# Patient Record
Sex: Female | Born: 1975 | Race: Black or African American | Hispanic: No | Marital: Single | Smoking: Former smoker
Health system: Southern US, Community
[De-identification: ages and names within clinical notes are randomized; demographics above are authoritative.]

## PROBLEM LIST (undated history)

## (undated) DIAGNOSIS — Z803 Family history of malignant neoplasm of breast: Secondary | ICD-10-CM

## (undated) DIAGNOSIS — E282 Polycystic ovarian syndrome: Secondary | ICD-10-CM

## (undated) DIAGNOSIS — E119 Type 2 diabetes mellitus without complications: Secondary | ICD-10-CM

## (undated) DIAGNOSIS — Z87410 Personal history of cervical dysplasia: Secondary | ICD-10-CM

## (undated) DIAGNOSIS — Z801 Family history of malignant neoplasm of trachea, bronchus and lung: Secondary | ICD-10-CM

## (undated) DIAGNOSIS — Z800 Family history of malignant neoplasm of digestive organs: Secondary | ICD-10-CM

## (undated) DIAGNOSIS — E669 Obesity, unspecified: Secondary | ICD-10-CM

## (undated) DIAGNOSIS — I10 Essential (primary) hypertension: Secondary | ICD-10-CM

## (undated) DIAGNOSIS — K219 Gastro-esophageal reflux disease without esophagitis: Secondary | ICD-10-CM

## (undated) DIAGNOSIS — T7840XA Allergy, unspecified, initial encounter: Secondary | ICD-10-CM

## (undated) DIAGNOSIS — K8020 Calculus of gallbladder without cholecystitis without obstruction: Secondary | ICD-10-CM

## (undated) DIAGNOSIS — N9489 Other specified conditions associated with female genital organs and menstrual cycle: Secondary | ICD-10-CM

## (undated) HISTORY — DX: Obesity, unspecified: E66.9

## (undated) HISTORY — DX: Essential (primary) hypertension: I10

## (undated) HISTORY — DX: Family history of malignant neoplasm of trachea, bronchus and lung: Z80.1

## (undated) HISTORY — DX: Family history of malignant neoplasm of breast: Z80.3

## (undated) HISTORY — DX: Personal history of cervical dysplasia: Z87.410

## (undated) HISTORY — DX: Family history of malignant neoplasm of digestive organs: Z80.0

## (undated) HISTORY — DX: Other specified conditions associated with female genital organs and menstrual cycle: N94.89

## (undated) HISTORY — DX: Allergy, unspecified, initial encounter: T78.40XA

## (undated) HISTORY — DX: Gastro-esophageal reflux disease without esophagitis: K21.9

---
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
## 1999-02-10 ENCOUNTER — Emergency Department (HOSPITAL_COMMUNITY): Admission: EM | Admit: 1999-02-10 | Discharge: 1999-02-10 | Payer: Self-pay | Admitting: Emergency Medicine

## 1999-06-04 ENCOUNTER — Encounter: Payer: Self-pay | Admitting: Family Medicine

## 1999-06-04 ENCOUNTER — Encounter: Admission: RE | Admit: 1999-06-04 | Discharge: 1999-06-04 | Payer: Self-pay | Admitting: Family Medicine

## 2003-01-22 ENCOUNTER — Encounter: Payer: Self-pay | Admitting: Emergency Medicine

## 2003-01-22 ENCOUNTER — Emergency Department (HOSPITAL_COMMUNITY): Admission: EM | Admit: 2003-01-22 | Discharge: 2003-01-22 | Payer: Self-pay

## 2005-10-02 ENCOUNTER — Emergency Department (HOSPITAL_COMMUNITY): Admission: EM | Admit: 2005-10-02 | Discharge: 2005-10-02 | Payer: Self-pay | Admitting: Family Medicine

## 2006-02-21 ENCOUNTER — Emergency Department (HOSPITAL_COMMUNITY): Admission: EM | Admit: 2006-02-21 | Discharge: 2006-02-21 | Payer: Self-pay | Admitting: Family Medicine

## 2006-04-11 ENCOUNTER — Other Ambulatory Visit: Admission: RE | Admit: 2006-04-11 | Discharge: 2006-04-11 | Payer: Self-pay | Admitting: Family Medicine

## 2006-06-13 ENCOUNTER — Ambulatory Visit (HOSPITAL_COMMUNITY): Admission: RE | Admit: 2006-06-13 | Discharge: 2006-06-13 | Payer: Self-pay | Admitting: Family Medicine

## 2007-07-23 ENCOUNTER — Ambulatory Visit (HOSPITAL_COMMUNITY): Admission: RE | Admit: 2007-07-23 | Discharge: 2007-07-23 | Payer: Self-pay | Admitting: Obstetrics and Gynecology

## 2007-09-10 ENCOUNTER — Inpatient Hospital Stay (HOSPITAL_COMMUNITY): Admission: AD | Admit: 2007-09-10 | Discharge: 2007-09-12 | Payer: Self-pay | Admitting: Obstetrics and Gynecology

## 2007-09-10 ENCOUNTER — Encounter: Payer: Self-pay | Admitting: Obstetrics and Gynecology

## 2007-09-11 ENCOUNTER — Encounter: Payer: Self-pay | Admitting: Obstetrics and Gynecology

## 2007-09-29 ENCOUNTER — Inpatient Hospital Stay (HOSPITAL_COMMUNITY): Admission: AD | Admit: 2007-09-29 | Discharge: 2007-09-29 | Payer: Self-pay | Admitting: Obstetrics and Gynecology

## 2007-10-08 ENCOUNTER — Inpatient Hospital Stay (HOSPITAL_COMMUNITY): Admission: AD | Admit: 2007-10-08 | Discharge: 2007-10-11 | Payer: Self-pay | Admitting: Obstetrics and Gynecology

## 2007-10-10 ENCOUNTER — Encounter (INDEPENDENT_AMBULATORY_CARE_PROVIDER_SITE_OTHER): Payer: Self-pay | Admitting: Obstetrics and Gynecology

## 2008-07-05 ENCOUNTER — Emergency Department (HOSPITAL_COMMUNITY): Admission: EM | Admit: 2008-07-05 | Discharge: 2008-07-06 | Payer: Self-pay | Admitting: Emergency Medicine

## 2010-05-27 ENCOUNTER — Encounter: Admission: RE | Admit: 2010-05-27 | Discharge: 2010-05-27 | Payer: Self-pay | Admitting: Internal Medicine

## 2010-07-15 ENCOUNTER — Encounter: Payer: Self-pay | Admitting: Obstetrics and Gynecology

## 2010-10-08 LAB — URINALYSIS, ROUTINE W REFLEX MICROSCOPIC
Hgb urine dipstick: NEGATIVE
Specific Gravity, Urine: 1.029 (ref 1.005–1.030)
Urobilinogen, UA: 1 mg/dL (ref 0.0–1.0)
pH: 7 (ref 5.0–8.0)

## 2010-11-06 NOTE — Discharge Summary (Signed)
Doris Armstrong, [place]                ACCOUNT NO.:  [id number]   MEDICAL RECORD NO.:  [id number]          PATIENT TYPE:  INP   LOCATION:  9302                          FACILITY:  [facility]   PHYSICIAN:  Malachi Pro. Ambrose Mantle, M.D. DATE OF BIRTH:  April 20, 1976   DATE OF ADMISSION:  10/08/2007  DATE OF DISCHARGE:  10/11/2007                               DISCHARGE SUMMARY   A 35 year old black female para 0, gravida 1 admitted with cervical  dilatation and probable labor.  The patient's history and physical is  dictated.  She underwent IVF conceived twin pregnancy and that her 59-  week ultrasound was noted to be 5 cm dilated per ultrasound.  My exam  showed the cervix to be more like 3-4 cm dilated.  I did put a cerclage  in with 3 sutures of 0 Prolene and placed her on Procardia.  She had  managed with bedrest at home but on October 07, 2007, the patient began  contracting, having lower abdominal cramps and bloody discharge, came to  the hospital, Dr. Jackelyn Knife admitted her, kept in the Trendelenburg  position, placed her on the Unasyn and kept the Procardia going.  In  spite of that, she began to have significant labor and she was without  question felt to be in labor.  The sutures were cut and the patient  progressed to deliver the twin pregnancies.  Both were previable.  The  delivery was attended by Dr. Ruben Gottron.  Both infants were female.  Both weighed 1 pound 3 ounces.  Both Apgars were 2, 1, 1, and 1.  The  first baby delivered, tied the cord off in the vagina hoping that the  placenta and the twin B would remain in situ, however, the patient  continued to contract and baby B presented.  Baby B delivered.  On call  opened the sac to allow the baby to breathe.  The placenta came with the  baby and the placenta of twin A was fished out with ring forceps.  The  patient could not tolerate a vaginal exam well, but there was no  evidence of any retained fragments of the placenta, although I did  continue her Unasyn for 24 hours postop.  The patient's white count was  11,500, hemoglobin 11.8, hematocrit 34.8, platelet count 249,000,  followup hemoglobin 9.9.  RPR was nonreactive.  Hepatitis B surface  antigen negative.  Rubella was normal at 21.9 indicating immunity.  The  blood group and type AB positive with a negative antibody.  The patient  remained afebrile.  Baby A was actually delivered on the 17th, baby B on  the 18th, so whether she is 1 or 2 days postpartum.  At the present  time, she is stable and ready for discharge.   FINAL DIAGNOSIS:  Intrauterine pregnancy 22-1/2 weeks, twin gestation  from in vitro fertilization, delivered previable twins.  The patient has  a history of hyperstimulation syndrome and abnormal liver function  tests.   DISCHARGE MEDICATIONS:  1. Augmentin 500 mg p.o. q.8 h. for 5 days.  2. Motrin 600 mg 30  tablets 1 every 6 hours as needed for pain.   The patient is advised to return to the office in 4 weeks for followup  examination.  She was counseled about the signs and symptoms of  postpartum depression.  She has declined counseling at the present time,  but she is encouraged to call if she has any signs of depression.      Malachi Pro. Ambrose Mantle, M.D.  Electronically Signed     TFH/MEDQ  D:  10/11/2007  T:  10/12/2007  Job:  [id number]   cc:   Fermin Schwab, MD  Fax: [phone]   Heather L. Rachel Bo, MD

## 2010-11-06 NOTE — Discharge Summary (Signed)
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   PROCEDURES:  On March 20, she had a cervical cerclage.   HISTORY AND PHYSICAL:  This is a 35 year old black female gravida 1,  para 0 with a twin gestation at approximately 18 weeks who is admitted  due to cervix on ultrasound being 5 cm dilated with twin A presenting  past the cervix.   PRENATAL CARE:  The patient conceived by in vitro fertilization and had  ovarian hyperstimulation syndrome with ascites, large ovaries, abnormal  LFTs, and a 30-pound weight gain.  This resolved.  She presented to the  [facility] on the day of admission for ultrasound  and again on ultrasound revealed the cervix to be 5 cm dilated.  She was  admitted and placed in Trendelenburg and given Indocin.   PAST SURGICAL HISTORY:  Left knee arthroscopy and a uterine polyp  removed in 2000.  The remainder of her history is noncontributory.   PHYSICAL EXAMINATION:  GENERAL:  She is afebrile with stable vital  signs.  ABDOMEN:  Gravid with fundus at the umbilicus.  PELVIC:  Cervical exam per Dr. Ambrose Mantle, she is fingertip dilated, 30-40%  effaced with the presenting part at -3 station.   HOSPITAL COURSE:  Again, she was admitted, placed in Trendelenburg, and  started on Indocin.  She was given a Foley catheter as well.  She  remained afebrile.  She had no significant contractions.  Dr. Ambrose Mantle  examined her on the morning of March 20, felt that  her cervix was  unchanged.  He discussed the situation with Maternal Fetal Medicine, Dr.  Katrinka Blazing and felt that it would be appropriate to proceed with a cervical  cerclage.  Later that day, he took her to the operating room and under  spinal anesthesia placed 2 sutures around the cervix.  His operative  note states that the cervix was 3 cm dilated with membranes at the  cervix but not prolapsing through.  Surgery went without complications.  She was continued on Indocin after the surgery.  That night, she had no  problems and in the morning she felt fine with no significant bleeding.  She had fetal heart tones x2.  At that time, she was felt to be stable  enough to be discharged home on bedrest.   DISCHARGE INSTRUCTIONS:  Regular diet, pelvic rest, no strenuous  activity.  Activity is bedrest and she is to call for increase in  contractions, pain, bleeding, or any leaking fluid, and she is to follow  up in approximately 1 week to check  the cervix.      Zenaida Niece, M.D.  Electronically Signed     TDM/MEDQ  D:  09/27/2007  T:  09/27/2007  Job:  [id number]

## 2010-11-06 NOTE — Op Note (Signed)
Doris Armstrong, Doris Armstrong                ACCOUNT NO.:  [id number]   MEDICAL RECORD NO.:  [id number]          PATIENT TYPE:  OUT   LOCATION:  MFM                           [facility]   PHYSICIAN:  Malachi Pro. Ambrose Mantle, M.D. DATE OF BIRTH:  06-Nov-1975   DATE OF PROCEDURE:  09/11/2007  DATE OF DISCHARGE:                               OPERATIVE REPORT   PREOPERATIVE DIAGNOSES:  1. Intrauterine pregnancy at 18+ weeks.  2. Twin gestation.  3. Incompetent cervix.   POSTOPERATIVE DIAGNOSES:  1. Intrauterine pregnancy at 18+ weeks.  2. Twin gestation.  3. Incompetent cervix.   OPERATION:  Cerclage procedure.   OPERATOR:  Malachi Pro. Ambrose Mantle, M.D.   ANESTHESIA:  Spinal anesthesia.   DESCRIPTION OF PROCEDURE:  The patient was brought to the operating  room, and in the right lateral position.  Dr. Arby Barrette inserted a spinal  anesthetic.  She was then placed in the lithotomy position.  A group B  strep culture was obtained from the distal vagina, the perineum, and the  anus.  The vulva was prepped with Betadine solution and draped as a  sterile field.  Inspection of the distal vagina revealed a yellowish  discharge; but this just appeared to be a vaginal discharge, not coming  from the uterus.   Insertion of a weighted speculum revealed the cervix was 3 cm dilated  with the membranes bulging to the cervix but not dumbbelling through the  cervix.  The vagina was then prepped with Betadine solution under direct  vision.  A #1 Prolene suture was used beginning at 6 o'clock, going  clockwise to 12 o'clock, and then counterclockwise to 12 o'clock.  The  membranes were pushed back, and the suture was tied down.  An identical  procedure was done, again, trying to go slightly above the first suture  posteriorly, and essentially at the same place anteriorly.  This was  tied down.  The sutures were cut at about an inch length, and the  procedure was terminated.  The cervix was closed at the end of the  procedure. There was no active bleeding, and the patient was returned to  recovery in satisfactory condition.      Malachi Pro. Ambrose Mantle, M.D.  Electronically Signed     TFH/MEDQ  D:  09/11/2007  T:  09/11/2007  Job:  [id number]

## 2010-11-06 NOTE — H&P (Signed)
NAME:  Doris Armstrong, Doris Armstrong                ACCOUNT NO.:  [id number]   MEDICAL RECORD NO.:  [id number]          PATIENT TYPE:  INP   LOCATION:  9173                          [facility]   PHYSICIAN:  Zenaida Niece, M.D.DATE OF BIRTH:  23-Apr-1976   DATE OF ADMISSION:  10/08/2007  DATE OF DISCHARGE:                              HISTORY & PHYSICAL   CHIEF COMPLAINT:  Vaginal discharge, incompetent cervix, and bulging  membranes.   HISTORY OF PRESENT ILLNESS:  This is a 35 year old black female gravida  1, para 0, with an EGA of 22+ weeks, with a twin intrauterine pregnancy  by in-vitro fertilization, who was hospitalized from March 19 to March  21, when she was 18 weeks.  At that time, she had an incompetent cervix  and had 2 cerclages placed by Dr. Ambrose Mantle.  She has been followed in the  office with some cervical dilation and membranes to the cervical os.  She called today complaining of some lower back cramping, as well as  some increased mucousy discharge.  Evaluation in the office on speculum  exam revealed bulging membranes.  On vaginal exam, her cervix was  significantly dilated with bulging membranes and a fetal part, at least  at the cervical os.  Options were discussed, and she has agreed to be  admitted for bedrest and observation.  She has been taking Procardia for  contractions.   PAST MEDICAL HISTORY:  Is negative, except for obesity.   PAST SURGICAL HISTORY:  Left knee arthroscopy and a polyp removal.   ALLERGIES:  NONE KNOWN.   CURRENT MEDICATIONS:  Procardia.   SOCIAL HISTORY:  The patient has a female partner and quit smoking with  pregnancy.  Again, this is the product of an IVF pregnancy.   FAMILY HISTORY:  Is noncontributory.   PHYSICAL EXAM:  VITAL SIGNS:  Weight is 275 pounds, blood pressure  120/80.  GENERAL:  This is a obese, gravid female in mild distress.  LUNGS:  Clear to auscultation.  HEART:  Regular rate and rhythm without murmur.  ABDOMEN:   Gravid, nontender.  She has fetal heart tones in the 140s and  in the 160s.  EXTREMITIES:  Have no edema and are nontender.  PELVIC:  Speculum exam reveals membranes at or past the cervical os.  On  vaginal exam, the cervix is a least 4 cm dilated with membranes and the  fetal part, at least at the cervical os or just past it.  Cervical  cerclage sutures are palpated.   ASSESSMENT:  Twin intrauterine pregnancy of 22+ weeks, with incompetent  cervix.  She has had 2 cerclages placed, but despite this she has  continued to dilate and prolapse membranes and fetal parts.  The  situation has been  discussed with the patient.  She agrees to be admitted and placed on  bedrest.  She understands she is not far enough along for fetal  viability, and thus we will not give steroids yet.  She will be kept on  Procardia to help with contractions.      Zenaida Niece, M.D.  Electronically Signed     TDM/MEDQ  D:  10/08/2007  T:  10/08/2007  Job:  [id number]

## 2010-11-09 NOTE — H&P (Signed)
NAMEMINSA, [place]                ACCOUNT NO.:  [id number]   MEDICAL RECORD NO.:  [id number]          PATIENT TYPE:  INP   LOCATION:  9302                          [facility]   PHYSICIAN:  Malachi Pro. Ambrose Mantle, M.D. DATE OF BIRTH:  07-31-1975   DATE OF ADMISSION:  10/08/2007  DATE OF DISCHARGE:  10/11/2007                              HISTORY & PHYSICAL   DEATH NOTE   Death note on infant twin A born to [place].  This infant was born  at 35 hours on 31-Oct-2008.  The infant was born at 22-1/2 weeks'  gestation.  Birth weight was 1 pound 3 ounces, was a female infant.  Apgars were 2 at one minute, 1 at five minutes, 1 at ten minutes.  The  time of death was at 2:04 a.m. on Oct 31, 2007.  No resuscitative  efforts were done.   Death note on baby B that was born to Sudie Bailey on 10-30-2007.  Baby B was a 1 pound 3 ounces female infant with Apgars of 2 at one, 1  at five, and 1 at ten minutes, who delivered at 11:53 p.m. on 2022-10-30 2009.  The time of death was recorded as 2:14 a.m. on Oct 31, 2007.  No resuscitated efforts were attempted.      Malachi Pro. Ambrose Mantle, M.D.  Electronically Signed     TFH/MEDQ  D:  10/26/2007  T:  10/27/2007  Job:  [id number]

## 2010-12-22 ENCOUNTER — Emergency Department (HOSPITAL_COMMUNITY)
Admission: EM | Admit: 2010-12-22 | Discharge: 2010-12-22 | Disposition: A | Discharge status: Home / Self Care | Payer: BC Managed Care – PPO | Attending: Emergency Medicine | Admitting: Emergency Medicine

## 2010-12-22 DIAGNOSIS — E119 Type 2 diabetes mellitus without complications: Secondary | ICD-10-CM | POA: Insufficient documentation

## 2010-12-22 DIAGNOSIS — I10 Essential (primary) hypertension: Secondary | ICD-10-CM | POA: Insufficient documentation

## 2010-12-22 DIAGNOSIS — R6889 Other general symptoms and signs: Secondary | ICD-10-CM | POA: Insufficient documentation

## 2011-03-18 LAB — RPR: RPR Ser Ql: NONREACTIVE

## 2011-03-18 LAB — CULTURE, BETA STREP (GROUP B ONLY)

## 2011-03-18 LAB — CBC
Platelets: 230
RBC: 3.27 — ABNORMAL LOW
WBC: 9.9

## 2011-03-19 LAB — CBC
Hemoglobin: 11.8 — ABNORMAL LOW
Hemoglobin: 9.9 — ABNORMAL LOW
MCHC: 34.8
MCHC: 34
MCHC: 34.9
MCV: 90
Platelets: 217
RBC: 3.16 — ABNORMAL LOW
RBC: 3.79 — ABNORMAL LOW
RDW: 15.4
WBC: 13.7 — ABNORMAL HIGH

## 2011-03-19 LAB — DIFFERENTIAL
Basophils Relative: 0
Eosinophils Relative: 0
Lymphocytes Relative: 10 — ABNORMAL LOW
Neutrophils Relative %: 84 — ABNORMAL HIGH

## 2011-03-19 LAB — TYPE AND SCREEN: ABO/RH(D): AB POS

## 2011-03-19 LAB — HEPATITIS B SURFACE ANTIGEN: Hepatitis B Surface Ag: NEGATIVE

## 2011-05-02 ENCOUNTER — Other Ambulatory Visit: Payer: Self-pay | Admitting: Family Medicine

## 2011-05-02 DIAGNOSIS — R748 Abnormal levels of other serum enzymes: Secondary | ICD-10-CM

## 2011-05-06 ENCOUNTER — Ambulatory Visit
Admission: RE | Admit: 2011-05-06 | Discharge: 2011-05-06 | Disposition: A | Discharge status: Home / Self Care | Payer: BC Managed Care – PPO | Source: Ambulatory Visit | Attending: Family Medicine | Admitting: Family Medicine

## 2011-05-06 DIAGNOSIS — R748 Abnormal levels of other serum enzymes: Secondary | ICD-10-CM

## 2011-07-08 ENCOUNTER — Ambulatory Visit (INDEPENDENT_AMBULATORY_CARE_PROVIDER_SITE_OTHER): Payer: BC Managed Care – PPO

## 2011-07-08 DIAGNOSIS — M25539 Pain in unspecified wrist: Secondary | ICD-10-CM

## 2011-07-08 DIAGNOSIS — S60219A Contusion of unspecified wrist, initial encounter: Secondary | ICD-10-CM

## 2012-01-07 ENCOUNTER — Ambulatory Visit: Payer: BC Managed Care – PPO

## 2012-01-07 ENCOUNTER — Ambulatory Visit (INDEPENDENT_AMBULATORY_CARE_PROVIDER_SITE_OTHER): Payer: BC Managed Care – PPO | Admitting: Family Medicine

## 2012-01-07 VITALS — BP 114/71 | HR 81 | Temp 98.3°F | Resp 18 | Wt 313.0 lb

## 2012-01-07 DIAGNOSIS — R079 Chest pain, unspecified: Secondary | ICD-10-CM

## 2012-01-07 DIAGNOSIS — R42 Dizziness and giddiness: Secondary | ICD-10-CM

## 2012-01-07 DIAGNOSIS — R0602 Shortness of breath: Secondary | ICD-10-CM

## 2012-01-07 LAB — POCT CBC
Granulocyte percent: 48.4 % (ref 37–80)
HCT, POC: 42 % (ref 37.7–47.9)
Hemoglobin: 12.9 g/dL (ref 12.2–16.2)
Lymph, poc: 3.2 (ref 0.6–3.4)
MCH, POC: 30.6 pg (ref 27–31.2)
MCHC: 30.7 g/dL — AB (ref 31.8–35.4)
MCV: 99.7 fL — AB (ref 80–97)
MID (cbc): 0.6 (ref 0–0.9)
MPV: 9.4 fL (ref 0–99.8)
POC Granulocyte: 3.6 (ref 2–6.9)
POC LYMPH PERCENT: 43.4 % (ref 10–50)
POC MID %: 8.2 %M (ref 0–12)
Platelet Count, POC: 260 10*3/uL (ref 142–424)
RBC: 4.21 M/uL (ref 4.04–5.48)
RDW, POC: 15.8 %
WBC: 7.4 10*3/uL (ref 4.6–10.2)

## 2012-01-07 LAB — GLUCOSE, POCT (MANUAL RESULT ENTRY): POC Glucose: 80 mg/dL (ref 70–99)

## 2012-01-07 NOTE — Progress Notes (Addendum)
[facility]:  Office Visit  Chief Complaint:  Chief Complaint  Patient presents with  . Anxiety  . Chest Pain  . Chest Pain    pressure, been going on for a couple of weeks    HPI: Doris Armstrong is a 36 y.o. female with a h/o well controlled Diabetes and HTN, who complains of 1 month h/o constant aching chest pain on left anterior chest at rest  while working on the computer, ocassional radiation to bilateral arms,  lasts for about 6-8 hrs when she is at work but does not feel anything when she is exercising/walking. HTN well controlled at home in the 110s/80s, HbA1c last week was 6.1. No family history of premature heart disease. Denies XOL.  + anxiety in past  Associated sxs: Gets SOB and feels like throat is closing up. Dizziness. Again only when she is at work.Denies work related or family stress, she works as Teacher, early years/pre for [employer].. Pressure in her arms and tightness around here throat.  10 years ago had similar sxs of chest tightness and shoulder pain when driving cars, this was anxiety related Cardiology workup 2-3 years ago was normal.  Past Medical History  Diagnosis Date  . Hypertension   . Diabetes mellitus    History reviewed. No pertinent past surgical history. History   Social History  . Marital Status: Single    Spouse Name: N/A    Number of Children: N/A  . Years of Education: N/A   Social History Main Topics  . Smoking status: Former Games developer  . Smokeless tobacco: None  . Alcohol Use: No  . Drug Use: No  . Sexually Active: None   Other Topics Concern  . None   Social History Narrative  . None   No family history on file. No Known Allergies Prior to Admission medications   Medication Sig Start Date End Date Taking? Authorizing Provider  lisinopril-hydrochlorothiazide (PRINZIDE,ZESTORETIC) 20-12.5 MG per tablet Take 1 tablet by mouth daily.   Yes Historical Provider, MD  sitaGLIPtan-metformin (JANUMET) 50-500 MG per tablet  Take 1 tablet by mouth 2 (two) times daily with a meal.   Yes Historical Provider, MD     ROS: The patient denies fevers, chills, night sweats, unintentional weight loss, palpitations, wheezing, dyspnea on exertion, nausea, vomiting, abdominal pain, dysuria, hematuria, melena, numbness, weakness, or tingling.   All other systems have been reviewed and were otherwise negative with the exception of those mentioned in the HPI and as above.    PHYSICAL EXAM: Filed Vitals:   01/07/12 1847  BP: 114/71  Pulse: 81  Temp: 98.3 F (36.8 C)  Resp: 18  Spo2                  98%  Filed Vitals:   01/07/12 1847  Weight: 313 lb (141.976 kg)   There is no height on file to calculate BMI.  General: Alert, no acute distress, morbidly obese AA female HEENT:  Normocephalic, atraumatic, oropharynx patent.  Cardiovascular:  Regular rate and rhythm, no rubs murmurs or gallops.  No Carotid bruits, radial pulse intact. No pedal edema.  Respiratory: Clear to auscultation bilaterally.  No wheezes, rales, or rhonchi.  No cyanosis, no use of accessory musculature GI: No organomegaly, abdomen is soft and non-tender, positive bowel sounds.  No masses. Skin: No rashes. Neurologic: Facial musculature symmetric. Psychiatric: Patient is appropriate throughout our interaction. Lymphatic: No cervical lymphadenopathy Musculoskeletal: Gait intact.   LABS: Results for orders  placed in visit on 01/07/12  POCT CBC      Component Value Range   WBC 7.4  4.6 - 10.2 K/uL   Lymph, poc 3.2  0.6 - 3.4   POC LYMPH PERCENT 43.4  10 - 50 %L   MID (cbc) 0.6  0 - 0.9   POC MID % 8.2  0 - 12 %M   POC Granulocyte 3.6  2 - 6.9   Granulocyte percent 48.4  37 - 80 %G   RBC 4.21  4.04 - 5.48 M/uL   Hemoglobin 12.9  12.2 - 16.2 g/dL   HCT, POC 32.4  40.1 - 47.9 %   MCV 99.7 (*) 80 - 97 fL   MCH, POC 30.6  27 - 31.2 pg   MCHC 30.7 (*) 31.8 - 35.4 g/dL   RDW, POC 02.7     Platelet Count, POC 260  142 - 424 K/uL   MPV 9.4  0 -  99.8 fL  GLUCOSE, POCT (MANUAL RESULT ENTRY)      Component Value Range   POC Glucose 80  70 - 99 mg/dl     EKG/XRAY:   Primary read interpreted by Dr. Conley Rolls at [facility]. EKG: NSR 74 bpm, no ST changes CXR-No infiltrates/pneumothorax/effusion   ASSESSMENT/PLAN: Encounter Diagnoses  Name Primary?  . Chest pain Yes  . Shortness of breath   . Dizziness    Atypical CP Chest pain primarily at rest while at work ? Cardiac related vs DM hypoglycemia  vs anxiety vs pulmonary vs less likely GERD  Referral to cardiology for urgent referral when she is able to get [place] her cardiologist's name in next 1-3  days due to h/o obesity, HTN, DM Get BP and fingerstick when feeling symptoms. Go to ER prn for worsening sxs  LE, THAO PHUONG, DO 01/07/2012 7:35 PM

## 2012-01-08 ENCOUNTER — Telehealth: Payer: Self-pay

## 2012-01-08 LAB — COMPREHENSIVE METABOLIC PANEL
ALT: 54 U/L — ABNORMAL HIGH (ref 0–35)
AST: 41 U/L — ABNORMAL HIGH (ref 0–37)
Alkaline Phosphatase: 58 U/L (ref 39–117)
BUN: 19 mg/dL (ref 6–23)
Calcium: 9.7 mg/dL (ref 8.4–10.5)
Creat: 1.04 mg/dL (ref 0.50–1.10)
Total Bilirubin: 0.5 mg/dL (ref 0.3–1.2)

## 2012-01-08 LAB — COMPREHENSIVE METABOLIC PANEL WITH GFR
Albumin: 4.3 g/dL (ref 3.5–5.2)
CO2: 26 meq/L (ref 19–32)
Chloride: 105 meq/L (ref 96–112)
Glucose, Bld: 88 mg/dL (ref 70–99)
Potassium: 4 meq/L (ref 3.5–5.3)
Sodium: 140 meq/L (ref 135–145)
Total Protein: 7.5 g/dL (ref 6.0–8.3)

## 2012-01-08 LAB — TSH: TSH: 1.494 u[IU]/mL (ref 0.350–4.500)

## 2012-01-08 NOTE — Telephone Encounter (Signed)
° °  Dr. Conley Rolls had requested this information - sehv  [phone]  Would like a birth control prescription called into [facility] on [address]?  Best Call Back [phone]

## 2012-01-09 ENCOUNTER — Encounter: Payer: Self-pay | Admitting: Family Medicine

## 2012-01-10 ENCOUNTER — Telehealth: Payer: Self-pay | Admitting: Family Medicine

## 2012-02-27 ENCOUNTER — Ambulatory Visit (INDEPENDENT_AMBULATORY_CARE_PROVIDER_SITE_OTHER): Payer: BC Managed Care – PPO | Admitting: Physician Assistant

## 2012-02-27 VITALS — BP 106/78 | HR 88 | Temp 98.7°F | Resp 16 | Ht 70.75 in | Wt 324.0 lb

## 2012-02-27 DIAGNOSIS — H9201 Otalgia, right ear: Secondary | ICD-10-CM

## 2012-02-27 DIAGNOSIS — H6980 Other specified disorders of Eustachian tube, unspecified ear: Secondary | ICD-10-CM

## 2012-02-27 DIAGNOSIS — H9209 Otalgia, unspecified ear: Secondary | ICD-10-CM

## 2012-02-27 MED ORDER — FLUTICASONE PROPIONATE 50 MCG/ACT NA SUSP: 2.0000 | Freq: Every day | NASAL | Status: DC

## 2012-02-27 MED ORDER — GUAIFENESIN ER 1200 MG PO TB12: 1.0000 | ORAL_TABLET | Freq: Two times a day (BID) | ORAL | Status: DC

## 2012-02-27 NOTE — Progress Notes (Signed)
Subjective:    Patient ID: Doris Armstrong, female    DOB: 23-Oct-1975, 36 y.o.   MRN: [id number]  HPI  Pt presents to clinic with R ear pain intermittent for the last 2 weeks.  Started along with a cold.  She has minimal hearing loss and no tinnitus.  Review of Systems  Constitutional: Negative for fever and chills.  HENT: Positive for ear pain and congestion. Negative for hearing loss, sore throat and ear discharge.   Respiratory: Negative for cough.        Objective:   Physical Exam  Vitals reviewed. Constitutional: She is oriented to person, place, and time. She appears well-developed and well-nourished.  HENT:  Head: Normocephalic and atraumatic.  Right Ear: Hearing, external ear and ear canal normal. No drainage or tenderness. Tympanic membrane is bulging (mild with serous fluid). Tympanic membrane is not injected and not erythematous. No decreased hearing is noted.  Left Ear: Hearing, external ear and ear canal normal. No drainage or tenderness. Tympanic membrane is bulging (mild with serous fluid). Tympanic membrane is not injected and not erythematous. No decreased hearing is noted.  Nose: Nose normal.  Eyes: Conjunctivae are normal.  Neck: Neck supple.  Cardiovascular: Normal rate, regular rhythm and normal heart sounds.   Pulmonary/Chest: Effort normal and breath sounds normal.  Neurological: She is alert and oriented to person, place, and time.  Skin: Skin is warm and dry.  Psychiatric: She has a normal mood and affect. Her behavior is normal. Judgment and thought content normal.          Assessment & Plan:   1. ETD (eustachian tube dysfunction)  fluticasone (FLONASE) 50 MCG/ACT nasal spray, Guaifenesin (MUCINEX MAXIMUM STRENGTH) 1200 MG TB12  2. Right ear pain     D/w pt that this may take 3-4 weeks for resolution.

## 2012-05-13 ENCOUNTER — Encounter (HOSPITAL_COMMUNITY): Payer: Self-pay | Admitting: *Deleted

## 2012-05-13 ENCOUNTER — Emergency Department (HOSPITAL_COMMUNITY)
Admission: EM | Admit: 2012-05-13 | Discharge: 2012-05-13 | Disposition: A | Discharge status: Home / Self Care | Payer: BC Managed Care – PPO | Attending: Emergency Medicine | Admitting: Emergency Medicine

## 2012-05-13 ENCOUNTER — Emergency Department (HOSPITAL_COMMUNITY): Payer: BC Managed Care – PPO

## 2012-05-13 DIAGNOSIS — K8020 Calculus of gallbladder without cholecystitis without obstruction: Secondary | ICD-10-CM | POA: Insufficient documentation

## 2012-05-13 DIAGNOSIS — E119 Type 2 diabetes mellitus without complications: Secondary | ICD-10-CM | POA: Insufficient documentation

## 2012-05-13 DIAGNOSIS — I10 Essential (primary) hypertension: Secondary | ICD-10-CM | POA: Insufficient documentation

## 2012-05-13 DIAGNOSIS — K2970 Gastritis, unspecified, without bleeding: Secondary | ICD-10-CM

## 2012-05-13 DIAGNOSIS — Z8742 Personal history of other diseases of the female genital tract: Secondary | ICD-10-CM | POA: Insufficient documentation

## 2012-05-13 HISTORY — DX: Polycystic ovarian syndrome: E28.2

## 2012-05-13 HISTORY — DX: Type 2 diabetes mellitus without complications: E11.9

## 2012-05-13 HISTORY — DX: Calculus of gallbladder without cholecystitis without obstruction: K80.20

## 2012-05-13 LAB — HEPATIC FUNCTION PANEL
ALT: 50 U/L — ABNORMAL HIGH (ref 0–35)
AST: 29 U/L (ref 0–37)
Total Protein: 7.6 g/dL (ref 6.0–8.3)

## 2012-05-13 LAB — RAPID URINE DRUG SCREEN, HOSP PERFORMED
Cocaine: NOT DETECTED
Opiates: NOT DETECTED
Tetrahydrocannabinol: NOT DETECTED

## 2012-05-13 LAB — LIPASE, BLOOD: Lipase: 47 U/L (ref 11–59)

## 2012-05-13 LAB — CBC WITH DIFFERENTIAL/PLATELET
Basophils Relative: 1 % (ref 0–1)
Eosinophils Absolute: 0.3 10*3/uL (ref 0.0–0.7)
HCT: 41 % (ref 36.0–46.0)
Hemoglobin: 13.9 g/dL (ref 12.0–15.0)
MCH: 32.3 pg (ref 26.0–34.0)
MCHC: 33.9 g/dL (ref 30.0–36.0)
Monocytes Absolute: 0.6 10*3/uL (ref 0.1–1.0)
Monocytes Relative: 7 % (ref 3–12)

## 2012-05-13 LAB — COMPREHENSIVE METABOLIC PANEL
Albumin: 3.7 g/dL (ref 3.5–5.2)
BUN: 13 mg/dL (ref 6–23)
Creatinine, Ser: 0.86 mg/dL (ref 0.50–1.10)
Total Protein: 7.5 g/dL (ref 6.0–8.3)

## 2012-05-13 LAB — URINALYSIS, ROUTINE W REFLEX MICROSCOPIC
Specific Gravity, Urine: 1.042 — ABNORMAL HIGH (ref 1.005–1.030)
Urobilinogen, UA: 0.2 mg/dL (ref 0.0–1.0)

## 2012-05-13 LAB — URINE MICROSCOPIC-ADD ON

## 2012-05-13 MED ORDER — ACETAMINOPHEN-CODEINE #3 300-30 MG PO TABS: 1.0000 | ORAL_TABLET | Freq: Four times a day (QID) | ORAL | Status: DC | PRN

## 2012-05-13 MED ORDER — HYDROMORPHONE HCL PF 1 MG/ML IJ SOLN
1.0000 mg | Freq: Once | INTRAMUSCULAR | Status: AC
  Administered 2012-05-13: 1 mg via INTRAVENOUS
  Filled 2012-05-13: qty 1

## 2012-05-13 MED ORDER — SODIUM CHLORIDE 0.9 % IV BOLUS (SEPSIS)
1000.0000 mL | Freq: Once | INTRAVENOUS | Status: AC
  Administered 2012-05-13: 1000 mL via INTRAVENOUS

## 2012-05-13 MED ORDER — DEXTROSE 5 % IV BOLUS
500.0000 mL | Freq: Once | INTRAVENOUS | Status: AC
  Administered 2012-05-13: 500 mL via INTRAVENOUS

## 2012-05-13 MED ORDER — ONDANSETRON HCL 4 MG/2ML IJ SOLN
4.0000 mg | Freq: Once | INTRAMUSCULAR | Status: AC
  Administered 2012-05-13: 4 mg via INTRAVENOUS

## 2012-05-13 MED ORDER — ONDANSETRON HCL 4 MG/2ML IJ SOLN
4.0000 mg | Freq: Once | INTRAMUSCULAR | Status: DC
  Filled 2012-05-13: qty 2

## 2012-05-13 MED ORDER — RANITIDINE HCL 150 MG PO TABS: 150.0000 mg | ORAL_TABLET | Freq: Two times a day (BID) | ORAL | Status: DC

## 2012-05-13 MED ORDER — FAMOTIDINE IN NACL 20-0.9 MG/50ML-% IV SOLN
20.0000 mg | Freq: Once | INTRAVENOUS | Status: AC
Start: 2012-05-13 — End: 2012-05-13
  Administered 2012-05-13: 20 mg via INTRAVENOUS
  Filled 2012-05-13: qty 50

## 2012-05-13 MED ORDER — HYDROCODONE-ACETAMINOPHEN 5-325 MG PO TABS
2.0000 | ORAL_TABLET | Freq: Once | ORAL | Status: AC
  Administered 2012-05-13: 2 via ORAL
  Filled 2012-05-13: qty 2

## 2012-05-13 NOTE — ED Notes (Signed)
Pt reports pain to upper abdomen radiating to left side and across mid back. Pt reports dx with gallstones 2 years ago, has appt with PCP today. Pain woke pt up at 0200 and has been constant since. Pain 8/10. Sharp at times, but constant dull pain in between sharp episodes.

## 2012-05-13 NOTE — ED Notes (Signed)
Pt to ED c/o upper abd pain that radiates to back.  States hx of gallstones and is d/t see MD today, but could not handle the pain.  Denies emesis.

## 2012-05-13 NOTE — ED Notes (Signed)
Patient transported to Ultrasound 

## 2012-05-13 NOTE — ED Provider Notes (Addendum)
History     CSN: [id number]  Arrival date & time 05/13/12  4098   First MD Initiated Contact with Patient 05/13/12 [id number]      Chief Complaint  Patient presents with  . Abdominal Pain    (Consider location/radiation/quality/duration/timing/severity/associated sxs/prior treatment) HPI Comments: 36 y/o female with hx of gall stones comes in with cc of epigastric pain. Pt reports that her pain started at 2 am, it woke her from sleep. The pain is located in the epigastrium, and sometimes radiates to the back and left shoulder. The pain is described as dull pain, with no specific aggravating or relieving factors. There is associated nausea, no emesis. No chest pain, sob.   Patient is a 36 y.o. female presenting with abdominal pain. The history is provided by the patient.  Abdominal Pain The primary symptoms of the illness include abdominal pain and nausea. The primary symptoms of the illness do not include shortness of breath, vomiting, diarrhea or dysuria.  Symptoms associated with the illness do not include constipation or hematuria.    Past Medical History  Diagnosis Date  . PCOS (polycystic ovarian syndrome)   . Hypertension   . Diabetes mellitus without complication   . Gall bladder stones     History reviewed. No pertinent past surgical history.  No family history on file.  History  Substance Use Topics  . Smoking status: Not on file  . Smokeless tobacco: Not on file  . Alcohol Use:     OB History    Grav Para Term Preterm Abortions TAB SAB Ect Mult Living                  Review of Systems  Constitutional: Negative for activity change.  HENT: Negative for facial swelling and neck pain.   Respiratory: Negative for cough, shortness of breath and wheezing.   Cardiovascular: Negative for chest pain.  Gastrointestinal: Positive for nausea and abdominal pain. Negative for vomiting, diarrhea, constipation, blood in stool and abdominal distention.  Genitourinary:  Negative for dysuria, hematuria, flank pain and difficulty urinating.  Skin: Negative for color change.  Neurological: Negative for speech difficulty.  Hematological: Does not bruise/bleed easily.  Psychiatric/Behavioral: Negative for confusion.    Allergies  Review of patient's allergies indicates no known allergies.  Home Medications   Current Outpatient Rx  Name  Route  Sig  Dispense  Refill  . LISINOPRIL-HYDROCHLOROTHIAZIDE 20-12.5 MG PO TABS   Oral   Take 1 tablet by mouth daily.         Marland Kitchen MEDROXYPROGESTERONE ACETATE 10 MG PO TABS   Oral   Take 10 mg by mouth daily.         Marland Kitchen METFORMIN HCL 500 MG PO TABS   Oral   Take 500 mg by mouth 2 (two) times daily with a meal.           BP 133/90  Pulse 76  Temp 97.8 F (36.6 C) (Oral)  Resp 19  SpO2 100%  LMP 04/01/2012  Physical Exam  Nursing note and vitals reviewed. Constitutional: She is oriented to person, place, and time. She appears well-developed.  HENT:  Head: Normocephalic and atraumatic.  Eyes: Conjunctivae normal and EOM are normal. Pupils are equal, round, and reactive to light.  Neck: Normal range of motion. Neck supple.  Cardiovascular: Normal rate, regular rhythm and normal heart sounds.   Pulmonary/Chest: Effort normal and breath sounds normal. No respiratory distress.  Abdominal: Soft. Bowel sounds are normal. She exhibits no  distension. There is tenderness. There is no rebound and no guarding.       Pt has epigastric, ruq and luq tenderness, with the epigastric tenderness being the most significant. Pt has no flank tenderness.  Neurological: She is alert and oriented to person, place, and time.  Skin: Skin is warm and dry.    ED Course  Procedures (including critical care time)   Labs Reviewed  CBC WITH DIFFERENTIAL  COMPREHENSIVE METABOLIC PANEL  LIPASE, BLOOD  HEPATIC FUNCTION PANEL  TROPONIN I  URINALYSIS, ROUTINE W REFLEX MICROSCOPIC  URINE RAPID DRUG SCREEN (HOSP PERFORMED)    No results found.   No diagnosis found.    MDM  DDx includes: Pancreatitis Hepatobiliary pathology including cholecystitis Gastritis/PUD SBO ACS syndrome Aortic Dissection  Pt with hx of DN, HTN and gall stones comes in with cc of epigastric pain, and some mild ruq and luq pain. Pt has hx of drinking 1-2  glass of wine daily as well. Initial impression is that patient is having some pancreatitis. Will get [place] and basic labs, and reassess.     Derwood Kaplan, MD 05/13/12 0752   Date: 05/13/2012  Rate: 70  Rhythm: normal sinus rhythm  QRS Axis: normal  Intervals: normal  ST/T Wave abnormalities: normal  Conduction Disutrbances: none  Narrative Interpretation: unremarkable     Derwood Kaplan, MD 05/13/12 0826  10:24 AM Pain resolved. Imaging shows no cholecystitis. Will have PCP f/u, as my concerns are for ulcer (she has alcohol use, and hx of GERD), and would want that etiology ruled out as well.    Derwood Kaplan, MD 05/13/12 1025

## 2012-05-14 LAB — URINE CULTURE: Colony Count: >=100000

## 2012-05-15 NOTE — ED Notes (Signed)
+   Urine Chart sent to EDP office for review. 

## 2012-05-17 ENCOUNTER — Telehealth (HOSPITAL_COMMUNITY): Payer: Self-pay | Admitting: Emergency Medicine

## 2012-05-17 NOTE — ED Notes (Signed)
Chart returned from EDP office. Prescribed Keflex 500 mg PO BID x 7 days. Prescribed by Emily West PA-C. °

## 2012-11-14 ENCOUNTER — Ambulatory Visit: Payer: BC Managed Care – PPO

## 2012-11-14 ENCOUNTER — Ambulatory Visit (INDEPENDENT_AMBULATORY_CARE_PROVIDER_SITE_OTHER): Payer: BC Managed Care – PPO | Admitting: Family Medicine

## 2012-11-14 VITALS — BP 134/88 | HR 81 | Temp 98.2°F | Resp 18 | Ht 72.0 in | Wt 311.0 lb

## 2012-11-14 DIAGNOSIS — R079 Chest pain, unspecified: Secondary | ICD-10-CM

## 2012-11-14 DIAGNOSIS — R0602 Shortness of breath: Secondary | ICD-10-CM

## 2012-11-14 LAB — POCT CBC
Granulocyte percent: 69.7 %G (ref 37–80)
HCT, POC: 40.4 % (ref 37.7–47.9)
Hemoglobin: 12.8 g/dL (ref 12.2–16.2)
Lymph, poc: 2.3 (ref 0.6–3.4)
MCH, POC: 31.6 pg — AB (ref 27–31.2)
MCHC: 31.7 g/dL — AB (ref 31.8–35.4)
MCV: 99.8 fL — AB (ref 80–97)
MID (cbc): 0.6 (ref 0–0.9)
MPV: 9.3 fL (ref 0–99.8)
POC Granulocyte: 6.6 (ref 2–6.9)
POC LYMPH PERCENT: 24 %L (ref 10–50)
POC MID %: 6.3 %M (ref 0–12)
Platelet Count, POC: 241 10*3/uL (ref 142–424)
RBC: 4.05 M/uL (ref 4.04–5.48)
RDW, POC: 14.5 %
WBC: 9.5 10*3/uL (ref 4.6–10.2)

## 2012-11-14 LAB — COMPREHENSIVE METABOLIC PANEL WITH GFR
ALT: 36 U/L — ABNORMAL HIGH (ref 0–35)
Albumin: 4.1 g/dL (ref 3.5–5.2)
BUN: 16 mg/dL (ref 6–23)
CO2: 27 meq/L (ref 19–32)
Calcium: 9.8 mg/dL (ref 8.4–10.5)
Chloride: 104 meq/L (ref 96–112)
Creat: 0.99 mg/dL (ref 0.50–1.10)
Potassium: 4.4 meq/L (ref 3.5–5.3)

## 2012-11-14 LAB — COMPREHENSIVE METABOLIC PANEL
AST: 26 U/L (ref 0–37)
Alkaline Phosphatase: 51 U/L (ref 39–117)
Glucose, Bld: 100 mg/dL — ABNORMAL HIGH (ref 70–99)
Sodium: 140 mEq/L (ref 135–145)
Total Bilirubin: 0.3 mg/dL (ref 0.3–1.2)
Total Protein: 7.1 g/dL (ref 6.0–8.3)

## 2012-11-14 LAB — TROPONIN I: Troponin I: 0.01 ng/mL (ref <0.06)

## 2012-11-14 NOTE — Progress Notes (Signed)
[facility]:  Office Visit  Chief Complaint:  Chief Complaint  Patient presents with  . Chest Pain    started last night  . Shortness of Breath    a little bit also started last night    HPI: Doris Armstrong is a 37 y.o. female who complains of left sided anterior chest pain since last night, was on the computer at home, has not been having exertional CP. It has been continuous with dull ache since last night. She has had similar sxs of  left sided CP before, this CP radiate above to 3 inches where it started then stopped. Denies HA related to her CP, n/v, abd pain. + slightly diaphoresis. Nonsmoker. She quit tobacco use 8 months ago.  Denies palpitations, wheezing, dyspnea on exertion,weakness, numbness/tingling. She has not done any new exercise activities or repetitive motion. No recent lifting, pushing, pulling. She denies a h/o GERD.  Has been short of breath randomly but no wheezing. Had leg swelling 4 days ago but none now.Denies any URI sxs. Denies family h/o MI/strokes or premature heart disease. Denies work related or family stress, she works as Teacher, early years/pre for [employer]. 10 years ago had similar sxs of chest tightness and shoulder pain when driving cars, this was anxiety related. I saw her last year  in July 2013 for similar sxs of CP and was referred to cardiology but she did not go.  Cardiology workup 2-3 years ago was normal. She recently switched BP meds- with her PCP, Dr Lelon Perla. Additionally she has been on steroid taper for allergic reaction and has only 1 day left to go.  HbA1c 6.2 1 month ago  Past Medical History  Diagnosis Date  . Diabetes mellitus   . PCOS (polycystic ovarian syndrome)   . Hypertension   . Diabetes mellitus without complication   . Gall bladder stones    History reviewed. No pertinent past surgical history. History   Social History  . Marital Status: Single    Spouse Name: N/A    Number of Children: N/A  . Years of  Education: N/A   Social History Main Topics  . Smoking status: Current Some Day Smoker  . Smokeless tobacco: None  . Alcohol Use: Yes  . Drug Use: No  . Sexually Active: None   Other Topics Concern  . None   Social History Narrative   ** Merged History Encounter **       History reviewed. No pertinent family history. No Known Allergies Prior to Admission medications   Medication Sig Start Date End Date Taking? Authorizing Provider  lisinopril-hydrochlorothiazide (PRINZIDE,ZESTORETIC) 20-12.5 MG per tablet Take 1 tablet by mouth daily.   Yes Historical Provider, MD  lisinopril-hydrochlorothiazide (PRINZIDE,ZESTORETIC) 20-12.5 MG per tablet Take 1 tablet by mouth daily.   Yes Historical Provider, MD  metFORMIN (GLUCOPHAGE) 500 MG tablet Take 500 mg by mouth 2 (two) times daily with a meal.   Yes Historical Provider, MD  predniSONE (DELTASONE) 10 MG tablet Take 10 mg by mouth daily.   Yes Historical Provider, MD  acetaminophen-codeine (TYLENOL #3) 300-30 MG per tablet Take 1-2 tablets by mouth every 6 (six) hours as needed for pain. 05/13/12   Derwood Kaplan, MD  fluticasone (FLONASE) 50 MCG/ACT nasal spray Place 2 sprays into the nose daily. 02/27/12 02/26/13  Morrell Riddle, PA-C  medroxyPROGESTERone (PROVERA) 10 MG tablet Take 10 mg by mouth daily.    Historical Provider, MD  sitaGLIPtan-metformin (JANUMET) 50-500 MG per  tablet Take 1 tablet by mouth 2 (two) times daily with a meal.    Historical Provider, MD     ROS: The patient denies fevers, chills, night sweats, unintentional weight loss,  palpitations, wheezing, dyspnea on exertion, nausea, vomiting, abdominal pain, dysuria, hematuria, melena, numbness, weakness, or tingling.   All other systems have been reviewed and were otherwise negative with the exception of those mentioned in the HPI and as above.    PHYSICAL EXAM: Filed Vitals:   11/14/12 1413  BP: 134/88  Pulse: 81  Temp: 98.2 F (36.8 C)  Resp: 18  Spo2  98% Filed  Vitals:   11/14/12 1413  Height: 6' (1.829 m)  Weight: 311 lb (141.069 kg)   Body mass index is 42.17 kg/(m^2).  General: Alert, no acute distress, obese pleasant AA female HEENT:  Normocephalic, atraumatic, oropharynx patent.  Cardiovascular:  Regular rate and rhythm, no rubs murmurs or gallops.  No Carotid bruits, radial pulse intact. No pedal edema.  Respiratory: Clear to auscultation bilaterally.  No wheezes, rales, or rhonchi.  No cyanosis, no use of accessory musculature GI: No organomegaly, abdomen is soft and non-tender, positive bowel sounds.  No masses. Skin: No rashes. Neurologic: Facial musculature symmetric. Psychiatric: Patient is appropriate throughout our interaction. Lymphatic: No cervical lymphadenopathy Musculoskeletal: Gait intact.   LABS: Results for orders placed in visit on 11/14/12  POCT CBC      Result Value Range   WBC 9.5  4.6 - 10.2 K/uL   Lymph, poc 2.3  0.6 - 3.4   POC LYMPH PERCENT 24.0  10 - 50 %L   MID (cbc) 0.6  0 - 0.9   POC MID % 6.3  0 - 12 %M   POC Granulocyte 6.6  2 - 6.9   Granulocyte percent 69.7  37 - 80 %G   RBC 4.05  4.04 - 5.48 M/uL   Hemoglobin 12.8  12.2 - 16.2 g/dL   HCT, POC 16.1  09.6 - 47.9 %   MCV 99.8 (*) 80 - 97 fL   MCH, POC 31.6 (*) 27 - 31.2 pg   MCHC 31.7 (*) 31.8 - 35.4 g/dL   RDW, POC 04.5     Platelet Count, POC 241  142 - 424 K/uL   MPV 9.3  0 - 99.8 fL     EKG/XRAY:   Primary read interpreted by Dr. Conley Rolls at [facility]. No acute cardiopulmonary process CXR EKG NSR at 78 bpm with nonspecific ST changes.    ASSESSMENT/PLAN: Encounter Diagnoses  Name Primary?  . SOB (shortness of breath) Yes  . Chest pain    Will get troponin I, CMP  and also she needs to get fasting lipids at some point  Needs referal to specialist for nonexertional CP with risk factors of DM and HTN, former tobacco user. Dr. Renata Caprice al for chest pain evaluation F/u prn or go to ER prn Her cell # [phone]   Rockne Coons,  DO 11/14/2012 3:27 PM

## 2012-11-15 ENCOUNTER — Telehealth: Payer: Self-pay | Admitting: Family Medicine

## 2012-11-15 NOTE — Telephone Encounter (Signed)
LM that her electrolytes, kidneys, liver and troponin were normal

## 2013-02-26 ENCOUNTER — Other Ambulatory Visit: Payer: Self-pay

## 2013-02-26 DIAGNOSIS — Z1231 Encounter for screening mammogram for malignant neoplasm of breast: Secondary | ICD-10-CM

## 2013-03-18 ENCOUNTER — Ambulatory Visit
Admission: RE | Admit: 2013-03-18 | Discharge: 2013-03-18 | Disposition: A | Discharge status: Home / Self Care | Payer: BC Managed Care – PPO | Source: Ambulatory Visit

## 2013-03-18 DIAGNOSIS — Z1231 Encounter for screening mammogram for malignant neoplasm of breast: Secondary | ICD-10-CM

## 2013-10-14 ENCOUNTER — Ambulatory Visit (INDEPENDENT_AMBULATORY_CARE_PROVIDER_SITE_OTHER): Payer: 59 | Admitting: Family Medicine

## 2013-10-14 VITALS — BP 124/80 | HR 69 | Temp 97.8°F | Resp 16 | Ht 70.0 in | Wt 332.0 lb

## 2013-10-14 DIAGNOSIS — R300 Dysuria: Secondary | ICD-10-CM

## 2013-10-14 DIAGNOSIS — R350 Frequency of micturition: Secondary | ICD-10-CM

## 2013-10-14 LAB — POCT UA - MICROSCOPIC ONLY
CRYSTALS, UR, HPF, POC: NEGATIVE
Casts, Ur, LPF, POC: NEGATIVE
Mucus, UA: NEGATIVE
Yeast, UA: NEGATIVE

## 2013-10-14 LAB — POCT URINALYSIS DIPSTICK
BILIRUBIN UA: NEGATIVE
Blood, UA: NEGATIVE
Glucose, UA: NEGATIVE
Ketones, UA: NEGATIVE
LEUKOCYTES UA: NEGATIVE
Nitrite, UA: NEGATIVE
Protein, UA: NEGATIVE
SPEC GRAV UA: 1.02
Urobilinogen, UA: 0.2
pH, UA: 6

## 2013-10-14 MED ORDER — CIPROFLOXACIN HCL 500 MG PO TABS: 500.0000 mg | ORAL_TABLET | Freq: Two times a day (BID) | ORAL | Status: DC

## 2013-10-14 NOTE — Patient Instructions (Signed)
Drink plenty of fluids  If your symptoms are getting all worse go ahead and begin the ciprofloxacin one twice daily  Get rechecked if worse.

## 2013-10-14 NOTE — Progress Notes (Signed)
Subjective: Patient has been having dysuria and urinary frequency over the last couple of weeks. No vaginal discharge. She has had some urinary infections in the past. She is working out in [place] intermittently, and is headed there tomorrow for 2 months.  Objective: No CVA tenderness. Abdomen soft with mild suprapubic tenderness.  Assessment: Dysuria Urinary frequency  Plan: Urinalysis   Results for orders placed in visit on 10/14/13  POCT UA - MICROSCOPIC ONLY      Result Value Ref Range   WBC, Ur, HPF, POC 2-4     RBC, urine, microscopic 0-1     Bacteria, U Microscopic 1+     Mucus, UA neg     Epithelial cells, urine per micros 2-4     Crystals, Ur, HPF, POC neg     Casts, Ur, LPF, POC neg     Yeast, UA neg    POCT URINALYSIS DIPSTICK      Result Value Ref Range   Color, UA yellow     Clarity, UA clear     Glucose, UA neg     Bilirubin, UA neg     Ketones, UA neg     Spec Grav, UA 1.020     Blood, UA neg     pH, UA 6.0     Protein, UA neg     Urobilinogen, UA 0.2     Nitrite, UA neg     Leukocytes, UA Negative     Will culture the urine but she does not appear to have a UTI. However since she is traveling tomorrow I gave her 3 days of Cipro and she can get it filled and use it if needed if symptoms are getting worse. Recheck if worse

## 2013-10-16 LAB — URINE CULTURE

## 2014-06-27 ENCOUNTER — Ambulatory Visit (INDEPENDENT_AMBULATORY_CARE_PROVIDER_SITE_OTHER): Payer: 59 | Admitting: Family Medicine

## 2014-06-27 VITALS — BP 122/78 | HR 69 | Temp 97.9°F | Resp 16 | Ht 71.5 in | Wt 338.2 lb

## 2014-06-27 DIAGNOSIS — I10 Essential (primary) hypertension: Secondary | ICD-10-CM

## 2014-06-27 DIAGNOSIS — R42 Dizziness and giddiness: Secondary | ICD-10-CM

## 2014-06-27 DIAGNOSIS — E119 Type 2 diabetes mellitus without complications: Secondary | ICD-10-CM

## 2014-06-27 LAB — POCT CBC
GRANULOCYTE PERCENT: 50.8 % (ref 37–80)
HCT, POC: 42.7 % (ref 37.7–47.9)
Hemoglobin: 13.8 g/dL (ref 12.2–16.2)
Lymph, poc: 2.8 (ref 0.6–3.4)
MCH, POC: 31.8 pg — AB (ref 27–31.2)
MCHC: 32.4 g/dL (ref 31.8–35.4)
MCV: 98.3 fL — AB (ref 80–97)
MID (CBC): 0.4 (ref 0–0.9)
MPV: 8.4 fL (ref 0–99.8)
POC Granulocyte: 3.3 (ref 2–6.9)
POC LYMPH %: 43.4 % (ref 10–50)
POC MID %: 5.8 %M (ref 0–12)
Platelet Count, POC: 198 10*3/uL (ref 142–424)
RBC: 4.34 M/uL (ref 4.04–5.48)
RDW, POC: 14.2 %
WBC: 6.4 10*3/uL (ref 4.6–10.2)

## 2014-06-27 LAB — GLUCOSE, POCT (MANUAL RESULT ENTRY): POC GLUCOSE: 97 mg/dL (ref 70–99)

## 2014-06-27 MED ORDER — MECLIZINE HCL 25 MG PO TABS: ORAL_TABLET | ORAL | Status: DC

## 2014-06-27 NOTE — Progress Notes (Signed)
Subjective: 39 year old lady who's been having some dizziness episodes. The first major episode was about 2 weeks ago coming back from [place]. She was driving. It lasted fairly briefly. He was not syncopal. Did not have room spinning, more just a lightheadedness primarily. No nausea or vomiting. She's been having a lot of episodes of this since then, and it is continued on so she wanted to get checked. Last menstrual period was just over a month ago. She denies any chance of pregnancy, is not sexually involved.  She is diabetic and has hypertension, both of which are well-controlled. Her last A1c was about 6.3. Glucose usually runs very good.  Objective: Alert and oriented. TMs normal. Eyes PERRLA. EOMs intact. Fundi benign. Throat clear. Neck supple without nodes thyromegaly. No carotid bruits. Chest is clear to auscultation. Heart regular without murmurs gallops or arrhythmias. Cranial nerves grossly intact. Gait normal. Able to tandem walk normally. Romberg negative. She is obese   Assessment: Nonspecific dizziness Hypertension Diabetes  Plan: . Results for orders placed or performed in visit on 06/27/14  POCT CBC  Result Value Ref Range   WBC 6.4 4.6 - 10.2 K/uL   Lymph, poc 2.8 0.6 - 3.4   POC LYMPH PERCENT 43.4 10 - 50 %L   MID (cbc) 0.4 0 - 0.9   POC MID % 5.8 0 - 12 %M   POC Granulocyte 3.3 2 - 6.9   Granulocyte percent 50.8 37 - 80 %G   RBC 4.34 4.04 - 5.48 M/uL   Hemoglobin 13.8 12.2 - 16.2 g/dL   HCT, POC 42.7 37.7 - 47.9 %   MCV 98.3 (A) 80 - 97 fL   MCH, POC 31.8 (A) 27 - 31.2 pg   MCHC 32.4 31.8 - 35.4 g/dL   RDW, POC 14.2 %   Platelet Count, POC 198 142 - 424 K/uL   MPV 8.4 0 - 99.8 fL  POCT glucose (manual entry)  Result Value Ref Range   POC Glucose 97 70 - 99 mg/dl   Assessment: Nonspecific dizziness  Plan: No major treatment. Blood chemistries are pending.

## 2014-06-27 NOTE — Patient Instructions (Signed)
Take the Antivert (meclizine) one every 6 hours only if needed for bad dizziness episodes.  Just give this time and I suspect it will pass on by. If you sinuses of feeling congested you can take an over-the-counter antihistamine decongestant such Claritin-D or Allegra-D.  When you get feeling lightheaded or dizzy when she drink extra water.  Return if symptoms persist.

## 2014-06-28 LAB — COMPLETE METABOLIC PANEL WITH GFR
ALBUMIN: 3.9 g/dL (ref 3.5–5.2)
ALK PHOS: 69 U/L (ref 39–117)
ALT: 266 U/L — AB (ref 0–35)
AST: 144 U/L — ABNORMAL HIGH (ref 0–37)
BUN: 12 mg/dL (ref 6–23)
CO2: 25 mEq/L (ref 19–32)
Calcium: 9.2 mg/dL (ref 8.4–10.5)
Chloride: 103 mEq/L (ref 96–112)
Creat: 0.77 mg/dL (ref 0.50–1.10)
GFR, Est African American: >89 mL/min
GLUCOSE: 87 mg/dL (ref 70–99)
POTASSIUM: 4.3 meq/L (ref 3.5–5.3)
SODIUM: 137 meq/L (ref 135–145)
TOTAL PROTEIN: 7 g/dL (ref 6.0–8.3)
Total Bilirubin: 0.4 mg/dL (ref 0.2–1.2)

## 2014-07-21 ENCOUNTER — Ambulatory Visit (INDEPENDENT_AMBULATORY_CARE_PROVIDER_SITE_OTHER): Payer: 59 | Admitting: Sports Medicine

## 2014-07-21 VITALS — BP 120/76 | HR 75 | Temp 98.4°F | Resp 18 | Ht 71.5 in | Wt 333.6 lb

## 2014-07-21 DIAGNOSIS — R945 Abnormal results of liver function studies: Principal | ICD-10-CM

## 2014-07-21 DIAGNOSIS — R7989 Other specified abnormal findings of blood chemistry: Secondary | ICD-10-CM | POA: Insufficient documentation

## 2014-07-21 NOTE — Progress Notes (Signed)
I have reviewed note by Dr. Paulla Fore and agree

## 2014-07-21 NOTE — Progress Notes (Signed)
Subjective:    Patient ID: Doris Armstrong, female    DOB: 01/12/1976, 39 y.o.   MRN: [id number]  HPI Patient is here for clinical recheck after laboratory findings of elevated ALTs and AST. These are reviewed today. She does report drinking 1-2 alcoholic beverages per night prior to the initial lab draw that she has completely cut this out at this time. She is recently added milk thistle to her daily regimen for hope of improving her LFTs. She denies any right upper quadrant pain no significant change in her eye color or skin color. Otherwise feeling well.   Review of Systems Per history of present illness    Objective:   Physical Exam VS:   HT:5' 11.5" (181.6 cm)   WT:(!) 333 lb 9.6 oz (151.32 kg)  BMI:46          BP:120/76 mmHg  HR:75bpm  TEMP:98.4 F (36.9 C)(Oral)  RESP:98 %  Morbidly obese African-American female no acute distress. No jaundice. No edema. No increased work of breathing    Assessment & Plan:    1. Elevated LFTs     recheck hepatic panel today.  Encouraged continued alcohol abstinence.  Will call with the results.   If abnormal will need further screening and recheck in 3 months. Continue with other chronic daily disease management.

## 2014-07-22 LAB — HEPATIC FUNCTION PANEL
ALT: 85 U/L — ABNORMAL HIGH (ref 0–35)
AST: 34 U/L (ref 0–37)
Albumin: 3.7 g/dL (ref 3.5–5.2)
Alkaline Phosphatase: 68 U/L (ref 39–117)
BILIRUBIN DIRECT: 0.1 mg/dL (ref 0.0–0.3)
BILIRUBIN INDIRECT: 0.3 mg/dL (ref 0.2–1.2)
BILIRUBIN TOTAL: 0.4 mg/dL (ref 0.2–1.2)
TOTAL PROTEIN: 6.7 g/dL (ref 6.0–8.3)

## 2015-03-17 ENCOUNTER — Ambulatory Visit (INDEPENDENT_AMBULATORY_CARE_PROVIDER_SITE_OTHER): Payer: 59 | Admitting: Family Medicine

## 2015-03-17 VITALS — BP 112/74 | HR 85 | Temp 98.4°F | Resp 16 | Ht 72.0 in | Wt 318.0 lb

## 2015-03-17 DIAGNOSIS — R748 Abnormal levels of other serum enzymes: Secondary | ICD-10-CM | POA: Diagnosis not present

## 2015-03-17 DIAGNOSIS — J039 Acute tonsillitis, unspecified: Secondary | ICD-10-CM

## 2015-03-17 DIAGNOSIS — R490 Dysphonia: Secondary | ICD-10-CM | POA: Diagnosis not present

## 2015-03-17 DIAGNOSIS — J029 Acute pharyngitis, unspecified: Secondary | ICD-10-CM

## 2015-03-17 LAB — HEPATIC FUNCTION PANEL
ALBUMIN: 3.8 g/dL (ref 3.6–5.1)
ALK PHOS: 69 U/L (ref 33–115)
ALT: 13 U/L (ref 6–29)
AST: 13 U/L (ref 10–30)
Bilirubin, Direct: 0.1 mg/dL (ref <=0.2)
Indirect Bilirubin: 0.2 mg/dL (ref 0.2–1.2)
Total Bilirubin: 0.3 mg/dL (ref 0.2–1.2)
Total Protein: 7.2 g/dL (ref 6.1–8.1)

## 2015-03-17 LAB — POCT CBC
GRANULOCYTE PERCENT: 68.9 % (ref 37–80)
HCT, POC: 41.4 % (ref 37.7–47.9)
Hemoglobin: 12.7 g/dL (ref 12.2–16.2)
Lymph, poc: 2.3 (ref 0.6–3.4)
MCH: 28.8 pg (ref 27–31.2)
MCHC: 30.7 g/dL — AB (ref 31.8–35.4)
MCV: 93.9 fL (ref 80–97)
MID (cbc): 0.5 (ref 0–0.9)
MPV: 8.3 fL (ref 0–99.8)
POC GRANULOCYTE: 6.2 (ref 2–6.9)
POC LYMPH PERCENT: 25.7 %L (ref 10–50)
POC MID %: 5.4 %M (ref 0–12)
Platelet Count, POC: 227 10*3/uL (ref 142–424)
RBC: 4.41 M/uL (ref 4.04–5.48)
RDW, POC: 15.5 %
WBC: 9 10*3/uL (ref 4.6–10.2)

## 2015-03-17 LAB — POCT RAPID STREP A (OFFICE): RAPID STREP A SCREEN: NEGATIVE

## 2015-03-17 MED ORDER — CEFTRIAXONE SODIUM 1 G IJ SOLR
1.0000 g | Freq: Once | INTRAMUSCULAR | Status: AC
  Administered 2015-03-17: 1 g via INTRAMUSCULAR

## 2015-03-17 MED ORDER — AMOXICILLIN-POT CLAVULANATE 875-125 MG PO TABS: 1.0000 | ORAL_TABLET | Freq: Two times a day (BID) | ORAL | Status: DC

## 2015-03-17 NOTE — Progress Notes (Signed)
Patient ID: Doris Armstrong, female    DOB: 10-Nov-1975  Age: 39 y.o. MRN: [id number]  Chief Complaint  Patient presents with  . Nasal Congestion  . Sore Throat    Subjective:   39 year old female who got sick little over a week ago. Her child been sick prior to her getting ill. She developed a problem with a bad sore throat her nose being congested. She says she may have felt a little bit better, but the throat still is stayed sore hoarse. She is not coughing much. She no longer smokes, having quit about 5 months ago.  Last winter liver enzymes were elevated, and when we repeated in 2 weeks later they were considerably improved. However I feel like they should be rechecked again.  Current allergies, medications, problem list, past/family and social histories reviewed.  Objective:  BP 112/74 mmHg  Pulse 85  Temp(Src) 98.4 F (36.9 C) (Oral)  Resp 16  Ht 6' (1.829 m)  Wt 318 lb (144.244 kg)  BMI 43.12 kg/m2  SpO2 97%  Overweight lady, pleasant, obviously doesn't feel well, speaks with a very soft whisper. She works from her home. Her TMs are normal. Throat very erythematous with white pus coating her tonsils. Neck supple with moderately large nodes. Chest clear to auscultation. Heart regular without murmurs.   Assessment & Plan:   Assessment: 1. Sore throat   2. Acute tonsillitis   3. Hoarseness of voice   4. Liver enzyme elevation       Plan: Strep screen and culture if necessary  Regardless of the results of the test will go ahead and give her a shot of ceftriaxone 1 g IM.  Also will check CBC and liver enzymes since back last winter she had elevated liver enzymes which had come down considerably on repeat but I think need to be followed up on.  Orders Placed This Encounter  Procedures  . Culture, Group A Strep  . Hepatic function panel  . POCT CBC  . POCT rapid strep A    Meds ordered this encounter  Medications  . cefTRIAXone (ROCEPHIN) injection 1 g    Sig:       Order Specific Question:  Antibiotic Indication:    Answer:  Other Indication (list below)    Order Specific Question:  Other Indication:    Answer:  tonsillitis    Results for orders placed or performed in visit on 03/17/15  POCT CBC  Result Value Ref Range   WBC 9.0 4.6 - 10.2 K/uL   Lymph, poc 2.3 0.6 - 3.4   POC LYMPH PERCENT 25.7 10 - 50 %L   MID (cbc) 0.5 0 - 0.9   POC MID % 5.4 0 - 12 %M   POC Granulocyte 6.2 2 - 6.9   Granulocyte percent 68.9 37 - 80 %G   RBC 4.41 4.04 - 5.48 M/uL   Hemoglobin 12.7 12.2 - 16.2 g/dL   HCT, POC 78.2 95.6 - 47.9 %   MCV 93.9 80 - 97 fL   MCH, POC 28.8 27 - 31.2 pg   MCHC 30.7 (A) 31.8 - 35.4 g/dL   RDW, POC 21.3 %   Platelet Count, POC 227.0 142 - 424 K/uL   MPV 8.3 0 - 99.8 fL  POCT rapid strep A  Result Value Ref Range   Rapid Strep A Screen Negative Negative        Patient Instructions  Drink plenty of fluids and get enough rest  Tylenol 1000  mg (2500 mg) 3 times daily as needed for fever and pain or ibuprofen 600-800 milligrams (2-3x 200 mg) 3 times daily as needed for fever or pain  Take Augmentin 875 one twice daily for infection  Return if worse at any time or go to the emergency room if difficulty breathing.  Tonsillitis Tonsillitis is an infection of the throat that causes the tonsils to become red, tender, and swollen. Tonsils are collections of lymphoid tissue at the back of the throat. Each tonsil has crevices (crypts). Tonsils help fight nose and throat infections and keep infection from spreading to other parts of the body for the first 18 months of life.  CAUSES Sudden (acute) tonsillitis is usually caused by infection with streptococcal bacteria. Long-lasting (chronic) tonsillitis occurs when the crypts of the tonsils become filled with pieces of food and bacteria, which makes it easy for the tonsils to become repeatedly infected. SYMPTOMS  Symptoms of tonsillitis include:  A sore throat, with possible  difficulty swallowing.  White patches on the tonsils.  Fever.  Tiredness.  New episodes of snoring during sleep, when you did not snore before.  Small, foul-smelling, yellowish-white pieces of material (tonsilloliths) that you occasionally cough up or spit out. The tonsilloliths can also cause you to have bad breath. DIAGNOSIS Tonsillitis can be diagnosed through a physical exam. Diagnosis can be confirmed with the results of lab tests, including a throat culture. TREATMENT  The goals of tonsillitis treatment include the reduction of the severity and duration of symptoms and prevention of associated conditions. Symptoms of tonsillitis can be improved with the use of steroids to reduce the swelling. Tonsillitis caused by bacteria can be treated with antibiotic medicines. Usually, treatment with antibiotic medicines is started before the cause of the tonsillitis is known. However, if it is determined that the cause is not bacterial, antibiotic medicines will not treat the tonsillitis. If attacks of tonsillitis are severe and frequent, your health care provider may recommend surgery to remove the tonsils (tonsillectomy). HOME CARE INSTRUCTIONS   Rest as much as possible and get plenty of sleep.  Drink plenty of fluids. While the throat is very sore, eat soft foods or liquids, such as sherbet, soups, or instant breakfast drinks.  Eat frozen ice pops.  Gargle with a warm or cold liquid to help soothe the throat. Mix 1/4 teaspoon of salt and 1/4 teaspoon of baking soda in 8 oz of water. SEEK MEDICAL CARE IF:   Large, tender lumps develop in your neck.  A rash develops.  A green, yellow-brown, or bloody substance is coughed up.  You are unable to swallow liquids or food for 24 hours.  You notice that only one of the tonsils is swollen. SEEK IMMEDIATE MEDICAL CARE IF:   You develop any new symptoms such as vomiting, severe headache, stiff neck, chest pain, or trouble breathing or  swallowing.  You have severe throat pain along with drooling or voice changes.  You have severe pain, unrelieved with recommended medications.  You are unable to fully open the mouth.  You develop redness, swelling, or severe pain anywhere in the neck.  You have a fever. MAKE SURE YOU:   Understand these instructions.  Will watch your condition.  Will get help right away if you are not doing well or get worse. Document Released: 03/20/2005 Document Revised: 10/25/2013 Document Reviewed: 11/27/2012 [facility] Patient Information 2015 [place], [place]. This information is not intended to replace advice given to you by your health care provider. Make  sure you discuss any questions you have with your health care provider.      No Follow-up on file.   HOPPER,DAVID, MD 03/17/2015

## 2015-03-17 NOTE — Patient Instructions (Signed)
Drink plenty of fluids and get enough rest  Tylenol 1000 mg (2500 mg) 3 times daily as needed for fever and pain or ibuprofen 600-800 milligrams (2-3x 200 mg) 3 times daily as needed for fever or pain  Take Augmentin 875 one twice daily for infection  Return if worse at any time or go to the emergency room if difficulty breathing.  Tonsillitis Tonsillitis is an infection of the throat that causes the tonsils to become red, tender, and swollen. Tonsils are collections of lymphoid tissue at the back of the throat. Each tonsil has crevices (crypts). Tonsils help fight nose and throat infections and keep infection from spreading to other parts of the body for the first 18 months of life.  CAUSES Sudden (acute) tonsillitis is usually caused by infection with streptococcal bacteria. Long-lasting (chronic) tonsillitis occurs when the crypts of the tonsils become filled with pieces of food and bacteria, which makes it easy for the tonsils to become repeatedly infected. SYMPTOMS  Symptoms of tonsillitis include:  A sore throat, with possible difficulty swallowing.  White patches on the tonsils.  Fever.  Tiredness.  New episodes of snoring during sleep, when you did not snore before.  Small, foul-smelling, yellowish-white pieces of material (tonsilloliths) that you occasionally cough up or spit out. The tonsilloliths can also cause you to have bad breath. DIAGNOSIS Tonsillitis can be diagnosed through a physical exam. Diagnosis can be confirmed with the results of lab tests, including a throat culture. TREATMENT  The goals of tonsillitis treatment include the reduction of the severity and duration of symptoms and prevention of associated conditions. Symptoms of tonsillitis can be improved with the use of steroids to reduce the swelling. Tonsillitis caused by bacteria can be treated with antibiotic medicines. Usually, treatment with antibiotic medicines is started before the cause of the  tonsillitis is known. However, if it is determined that the cause is not bacterial, antibiotic medicines will not treat the tonsillitis. If attacks of tonsillitis are severe and frequent, your health care provider may recommend surgery to remove the tonsils (tonsillectomy). HOME CARE INSTRUCTIONS   Rest as much as possible and get plenty of sleep.  Drink plenty of fluids. While the throat is very sore, eat soft foods or liquids, such as sherbet, soups, or instant breakfast drinks.  Eat frozen ice pops.  Gargle with a warm or cold liquid to help soothe the throat. Mix 1/4 teaspoon of salt and 1/4 teaspoon of baking soda in 8 oz of water. SEEK MEDICAL CARE IF:   Large, tender lumps develop in your neck.  A rash develops.  A green, yellow-brown, or bloody substance is coughed up.  You are unable to swallow liquids or food for 24 hours.  You notice that only one of the tonsils is swollen. SEEK IMMEDIATE MEDICAL CARE IF:   You develop any new symptoms such as vomiting, severe headache, stiff neck, chest pain, or trouble breathing or swallowing.  You have severe throat pain along with drooling or voice changes.  You have severe pain, unrelieved with recommended medications.  You are unable to fully open the mouth.  You develop redness, swelling, or severe pain anywhere in the neck.  You have a fever. MAKE SURE YOU:   Understand these instructions.  Will watch your condition.  Will get help right away if you are not doing well or get worse. Document Released: 03/20/2005 Document Revised: 10/25/2013 Document Reviewed: 11/27/2012 [facility] Patient Information 2015 [place], [place]. This information is not intended to replace  advice given to you by your health care provider. Make sure you discuss any questions you have with your health care provider.  

## 2015-03-18 LAB — CULTURE, GROUP A STREP: Organism ID, Bacteria: NORMAL

## 2015-03-21 ENCOUNTER — Telehealth: Payer: Self-pay

## 2015-03-21 MED ORDER — FLUCONAZOLE 150 MG PO TABS: 150.0000 mg | ORAL_TABLET | Freq: Once | ORAL | Status: DC

## 2015-03-21 NOTE — Telephone Encounter (Signed)
Done

## 2015-03-21 NOTE — Telephone Encounter (Signed)
On amoxicillin 9/23

## 2015-03-21 NOTE — Telephone Encounter (Signed)
Diflucan requested.   Yeast infection after taking antiboitic.   [facility]    [phone] (H)

## 2015-03-22 ENCOUNTER — Encounter: Payer: Self-pay | Admitting: Family Medicine

## 2015-03-22 NOTE — Telephone Encounter (Signed)
Called and spoke with pt. Informed her that prescription had been called into pharmacy.

## 2015-05-01 ENCOUNTER — Ambulatory Visit (INDEPENDENT_AMBULATORY_CARE_PROVIDER_SITE_OTHER): Payer: 59 | Admitting: Emergency Medicine

## 2015-05-01 VITALS — BP 116/74 | HR 76 | Temp 98.2°F | Resp 16 | Ht 72.0 in | Wt 317.0 lb

## 2015-05-01 DIAGNOSIS — J0140 Acute pansinusitis, unspecified: Secondary | ICD-10-CM | POA: Diagnosis not present

## 2015-05-01 MED ORDER — AMOXICILLIN-POT CLAVULANATE 875-125 MG PO TABS: 1.0000 | ORAL_TABLET | Freq: Two times a day (BID) | ORAL | Status: DC

## 2015-05-01 MED ORDER — PSEUDOEPHEDRINE-GUAIFENESIN ER 60-600 MG PO TB12: 1.0000 | ORAL_TABLET | Freq: Two times a day (BID) | ORAL | Status: DC

## 2015-05-01 NOTE — Patient Instructions (Signed)

## 2015-05-01 NOTE — Progress Notes (Signed)
Subjective:  Patient ID: Doris Armstrong, female    DOB: [birth year]  Age: 39 y.o. MRN: [id number]  CC: Sinusitis   HPI Doris Armstrong presents  with nasal congestion postnasal drainage and purulent nasal discharge. Has no cough wheezing or shortness of breath. Has no fever chills. No nausea or vomiting. She does complain of some pain and pressure in her cheeks. She had no improvement with over-the-counter medication she has no stool change rash.  History Doris Armstrong has a past medical history of Diabetes mellitus; PCOS (polycystic ovarian syndrome); Hypertension; Diabetes mellitus without complication (HCC); and Gall bladder stones.   She has no past surgical history on file.   Her  family history is not on file.  She   reports that she has quit smoking. She does not have any smokeless tobacco history on file. She reports that she drinks alcohol. She reports that she does not use illicit drugs.  Outpatient Prescriptions Prior to Visit  Medication Sig Dispense Refill  . carvedilol (COREG) 12.5 MG tablet Take 12.5 mg by mouth 2 (two) times daily with a meal.    . losartan-hydrochlorothiazide (HYZAAR) 50-12.5 MG per tablet Take 1 tablet by mouth daily.    . fluticasone (FLONASE) 50 MCG/ACT nasal spray Place 2 sprays into the nose daily. 16 g 0  . milk thistle 175 MG tablet Take 175 mg by mouth daily.    Marland Kitchen amoxicillin-clavulanate (AUGMENTIN) 875-125 MG per tablet Take 1 tablet by mouth 2 (two) times daily. 20 tablet 0  . fluconazole (DIFLUCAN) 150 MG tablet Take 1 tablet (150 mg total) by mouth once. Repeat in 1 week is needed 2 tablet 0  . medroxyPROGESTERone (PROVERA) 10 MG tablet Take 10 mg by mouth daily.     No facility-administered medications prior to visit.    Social History   Social History  . Marital Status: Single    Spouse Name: N/A  . Number of Children: N/A  . Years of Education: N/A   Social History Main Topics  . Smoking status: Former Games developer  . Smokeless tobacco:  None  . Alcohol Use: Yes  . Drug Use: No  . Sexual Activity: Not Asked   Other Topics Concern  . None   Social History Narrative   ** Merged History Encounter **         Review of Systems  Constitutional: Negative for fever, chills and appetite change.  HENT: Positive for congestion, postnasal drip and rhinorrhea. Negative for ear pain, sinus pressure and sore throat.   Eyes: Negative for pain and redness.  Respiratory: Negative for cough, shortness of breath and wheezing.   Cardiovascular: Negative for leg swelling.  Gastrointestinal: Negative for nausea, vomiting, abdominal pain, diarrhea, constipation and blood in stool.  Endocrine: Negative for polyuria.  Genitourinary: Negative for dysuria, urgency, frequency and flank pain.  Musculoskeletal: Negative for gait problem.  Skin: Negative for rash.  Neurological: Negative for weakness and headaches.  Psychiatric/Behavioral: Negative for confusion and decreased concentration. The patient is not nervous/anxious.     Objective:  BP 116/74 mmHg  Pulse 76  Temp(Src) 98.2 F (36.8 C) (Oral)  Resp 16  Ht 6' (1.829 m)  Wt 317 lb (143.79 kg)  BMI 42.98 kg/m2  SpO2 98%  LMP 04/07/2015  Physical Exam  Constitutional: She is oriented to person, place, and time. She appears well-developed and well-nourished. No distress.  HENT:  Head: Normocephalic and atraumatic.  Right Ear: External ear normal.  Left Ear: External ear normal.  Nose: Nose normal.  Eyes: Conjunctivae and EOM are normal. Pupils are equal, round, and reactive to light. No scleral icterus.  Neck: Normal range of motion. Neck supple. No tracheal deviation present.  Cardiovascular: Normal rate, regular rhythm and normal heart sounds.   Pulmonary/Chest: Effort normal. No respiratory distress. She has no wheezes. She has no rales.  Abdominal: She exhibits no mass. There is no tenderness. There is no rebound and no guarding.  Musculoskeletal: She exhibits no edema.    Lymphadenopathy:    She has no cervical adenopathy.  Neurological: She is alert and oriented to person, place, and time. Coordination normal.  Skin: Skin is warm and dry. No rash noted.  Psychiatric: She has a normal mood and affect. Her behavior is normal.      Assessment & Plan:   Doris Armstrong was seen today for sinusitis.  Diagnoses and all orders for this visit:  Acute pansinusitis, recurrence not specified  Other orders -     amoxicillin-clavulanate (AUGMENTIN) 875-125 MG tablet; Take 1 tablet by mouth 2 (two) times daily. -     pseudoephedrine-guaifenesin (MUCINEX D) 60-600 MG 12 hr tablet; Take 1 tablet by mouth every 12 (twelve) hours.  I have discontinued Doris Armstrong's medroxyPROGESTERone, amoxicillin-clavulanate, and fluconazole. I am also having her start on amoxicillin-clavulanate and pseudoephedrine-guaifenesin. Additionally, I am having her maintain her fluticasone, losartan-hydrochlorothiazide, milk thistle, carvedilol, and metFORMIN.  Meds ordered this encounter  Medications  . metFORMIN (GLUCOPHAGE) 500 MG tablet    Sig: Take by mouth 2 (two) times daily with a meal.  . amoxicillin-clavulanate (AUGMENTIN) 875-125 MG tablet    Sig: Take 1 tablet by mouth 2 (two) times daily.    Dispense:  20 tablet    Refill:  0  . pseudoephedrine-guaifenesin (MUCINEX D) 60-600 MG 12 hr tablet    Sig: Take 1 tablet by mouth every 12 (twelve) hours.    Dispense:  18 tablet    Refill:  0    Appropriate red flag conditions were discussed with the patient as well as actions that should be taken.  Patient expressed his understanding.  Follow-up: Return if symptoms worsen or fail to improve.  Carmelina Dane, MD

## 2015-05-05 ENCOUNTER — Telehealth: Payer: Self-pay | Admitting: General Practice

## 2015-05-05 NOTE — Telephone Encounter (Signed)
Pt req. rx for yeast infection be called in due to a side effect of the current antibiotic causing Yeast Infections.  [address]  [phone]

## 2015-05-06 MED ORDER — FLUCONAZOLE 150 MG PO TABS: 150.0000 mg | ORAL_TABLET | Freq: Once | ORAL | Status: DC

## 2015-05-06 NOTE — Telephone Encounter (Signed)
Sent per protocol/ Dr Ouida Sills has her on Augmentin called her to advise

## 2015-08-13 ENCOUNTER — Ambulatory Visit (INDEPENDENT_AMBULATORY_CARE_PROVIDER_SITE_OTHER): Payer: 59 | Admitting: Internal Medicine

## 2015-08-13 VITALS — BP 130/79 | HR 91 | Temp 98.0°F | Resp 18 | Ht 73.0 in | Wt 323.0 lb

## 2015-08-13 DIAGNOSIS — J0140 Acute pansinusitis, unspecified: Secondary | ICD-10-CM | POA: Diagnosis not present

## 2015-08-13 DIAGNOSIS — H6980 Other specified disorders of Eustachian tube, unspecified ear: Secondary | ICD-10-CM | POA: Diagnosis not present

## 2015-08-13 MED ORDER — PSEUDOEPHEDRINE-GUAIFENESIN ER 60-600 MG PO TB12: 1.0000 | ORAL_TABLET | Freq: Two times a day (BID) | ORAL | Status: DC

## 2015-08-13 MED ORDER — FLUTICASONE PROPIONATE 50 MCG/ACT NA SUSP: 2.0000 | Freq: Every day | NASAL | Status: DC

## 2015-08-13 MED ORDER — FLUCONAZOLE 150 MG PO TABS: 150.0000 mg | ORAL_TABLET | Freq: Once | ORAL | Status: DC

## 2015-08-13 MED ORDER — AMOXICILLIN-POT CLAVULANATE 875-125 MG PO TABS: 1.0000 | ORAL_TABLET | Freq: Two times a day (BID) | ORAL | Status: DC

## 2015-08-13 NOTE — Progress Notes (Signed)
Subjective:    Patient ID: Doris Armstrong, female    DOB: 1975-07-26, 40 y.o.   MRN: [id number] By signing my name below, I, Littie Deeds, attest that this documentation has been prepared under the direction and in the presence of Ellamae Sia, MD.  Electronically Signed: Littie Deeds, Medical Scribe. 08/13/2015. 10:17 AM.  HPI HPI Comments: Doris Armstrong is a 40 y.o. female who presents to the [facility] complaining of gradual onset nasal congestion that started about 1.5 weeks ago. She reports having associated cough at night. Patient believes she has a sinus infection and notes that she tends to have sinus infections when she has a cold. She has seasonal allergies (including congestion and has been noted to snore) throughout the year, but she is not on any regular medications for allergies. She has been on Flonase before. Patient denies ear pain. She notes she usually has a yeast infection when she is on antibiotics. She has not been tested for sleep apnea before.   Review of Systems     Objective:   Physical Exam  Constitutional: She is oriented to person, place, and time. She appears well-developed and well-nourished. No distress.  HENT:  Head: Normocephalic and atraumatic.  Right Ear: Tympanic membrane normal.  Left Ear: Tympanic membrane normal.  Mouth/Throat: Oropharynx is clear and moist. No oropharyngeal exudate.  Purulent discharge from the nares. Throat clear.  Eyes: Pupils are equal, round, and reactive to light.  Neck: Neck supple.  Cardiovascular: Normal rate.   Pulmonary/Chest: Effort normal.  Musculoskeletal: She exhibits no edema.  Lymphadenopathy:    She has no cervical adenopathy.  Neurological: She is alert and oriented to person, place, and time. No cranial nerve deficit.  Skin: Skin is warm and dry. No rash noted.  Psychiatric: She has a normal mood and affect. Her behavior is normal.  Nursing note and vitals reviewed.           Assessment & Plan:    I have completed the patient encounter in its entirety as documented by the scribe, with editing by me where necessary. Blaise Palladino P. Merla Riches, M.D. Acute pansinusitis, recurrence not specified  ETD (eustachian tube dysfunction), unspecified laterality - Plan: fluticasone (FLONASE) 50 MCG/ACT nasal spray  Meds ordered this encounter  Medications  . pseudoephedrine-guaifenesin (MUCINEX D) 60-600 MG 12 hr tablet    Sig: Take 1 tablet by mouth every 12 (twelve) hours.    Dispense:  20 tablet    Refill:  0  . fluticasone (FLONASE) 50 MCG/ACT nasal spray    Sig: Place 2 sprays into both nostrils daily.    Dispense:  16 g    Refill:  10  . fluconazole (DIFLUCAN) 150 MG tablet    Sig: Take 1 tablet (150 mg total) by mouth once. Repeat if needed    Dispense:  2 tablet    Refill:  0  . amoxicillin-clavulanate (AUGMENTIN) 875-125 MG tablet    Sig: Take 1 tablet by mouth 2 (two) times daily.    Dispense:  20 tablet    Refill:  0   Stay on flo yearround

## 2015-10-06 ENCOUNTER — Ambulatory Visit (INDEPENDENT_AMBULATORY_CARE_PROVIDER_SITE_OTHER): Payer: 59 | Admitting: Emergency Medicine

## 2015-10-06 VITALS — BP 110/70 | HR 77 | Temp 97.9°F | Resp 16 | Ht 72.0 in | Wt 329.0 lb

## 2015-10-06 DIAGNOSIS — R739 Hyperglycemia, unspecified: Secondary | ICD-10-CM

## 2015-10-06 DIAGNOSIS — I10 Essential (primary) hypertension: Secondary | ICD-10-CM | POA: Diagnosis not present

## 2015-10-06 DIAGNOSIS — Z0000 Encounter for general adult medical examination without abnormal findings: Secondary | ICD-10-CM

## 2015-10-06 NOTE — Progress Notes (Signed)
By signing my name below, I, Mesha Guiyard, attest that this documentation has been prepared under the direction and in the presence of Arlyss Queen, MD.  Electronically Signed: Verlee Monte, Medical Scribe. 10/06/2015. 1:44 PM.  Chief Complaint:  Chief Complaint  Patient presents with  . needs medical clearance    paper in folder    HPI: Doris Armstrong is a 40 y.o. female who reports to [facility] today for health clearance form for social services for adoption.  Pt reports being on losartan, medication for HTN, and Glucophage in the past for pre-diabetes.. 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 She reports having family to help. Pt denies having TB or exposure to TB. No h/o HIV, or behavioral health hospitalizations. Pt reports it's her cousin's baby, and was not born in foreign country  Past Medical History  Diagnosis Date  . Diabetes mellitus   . PCOS (polycystic ovarian syndrome)   . Hypertension   . Diabetes mellitus without complication ([place])   . Gall bladder stones    History reviewed. No pertinent past surgical history. Social History   Social History  . Marital Status: Single    Spouse Name: N/A  . Number of Children: N/A  . Years of Education: N/A   Social History Main Topics  . Smoking status: Former Research scientist (life sciences)  . Smokeless tobacco: None  . Alcohol Use: Yes  . Drug Use: No  . Sexual Activity: Not Asked   Other Topics Concern  . None   Social History Narrative   ** Merged History Encounter **       History reviewed. No pertinent family history. Allergies  Allergen Reactions  . Lisinopril Swelling   Prior to Admission medications   Medication Sig Start Date End Date Taking? Authorizing Provider  carvedilol (COREG) 12.5 MG tablet Take 12.5 mg by mouth 2 (two) times daily with a meal.   Yes Historical Provider, MD  losartan-hydrochlorothiazide (HYZAAR) 50-12.5 MG per tablet Take 1 tablet by mouth daily.   Yes Historical Provider, MD  fluticasone (FLONASE)  50 MCG/ACT nasal spray Place 2 sprays into both nostrils daily. Patient not taking: Reported on 10/06/2015 08/13/15 01/27/19  Leandrew Koyanagi, MD  metFORMIN (GLUCOPHAGE) 500 MG tablet Take by mouth 2 (two) times daily with a meal. Reported on 10/06/2015    Historical Provider, MD  milk thistle 175 MG tablet Take 175 mg by mouth daily. Reported on 10/06/2015    Historical Provider, MD     ROS: The patient denies fevers, chills, night sweats, unintentional weight loss, chest pain, palpitations, wheezing, dyspnea on exertion, nausea, vomiting, abdominal pain, dysuria, hematuria, melena, numbness, weakness, or tingling.  All other systems have been reviewed and were otherwise negative with the exception of those mentioned in the HPI and as above.    PHYSICAL EXAM: Filed Vitals:   10/06/15 1330  BP: 110/70  Pulse: 77  Temp: 97.9 F (36.6 C)  Resp: 16   Body mass index is 44.61 kg/(m^2).   General: Alert, no acute distress. Obese. HEENT:  Normocephalic, atraumatic, oropharynx patent. Eye: Juliette Mangle [facility] Cardiovascular:  Regular rate and rhythm, no rubs murmurs or gallops.  No Carotid bruits, radial pulse intact. No pedal edema.  Respiratory: Clear to auscultation bilaterally.  No wheezes, rales, or rhonchi.  No cyanosis, no use of accessory musculature Abdominal: No organomegaly, abdomen is soft and non-tender, positive bowel sounds.  No masses. Musculoskeletal: Gait intact. No edema, tenderness Skin: No rashes. Neurologic: Facial musculature symmetric. Psychiatric: Patient  acts appropriately throughout our interaction. Lymphatic: No cervical or submandibular lymphadenopathy  LABS:  EKG/XRAY:   Primary read interpreted by Dr. Everlene Farrier at [facility].   ASSESSMENT/PLAN: Patient under treatment for hypertension. She poses no risk to his small child. She has no evidence of any communicable diseases. She has been on Glucophage in the past for borderline diabetes but not currently under treatment.I  personally performed the services described in this documentation, which was scribed in my presence. The recorded information has been reviewed and is accurate.   Gross sideeffects, risk and benefits, and alternatives of medications d/w patient. Patient is aware that all medications have potential sideeffects and we are unable to predict every sideeffect or drug-drug interaction that may occur.  Arlyss Queen MD 10/06/2015 1:44 PM

## 2015-10-06 NOTE — Patient Instructions (Signed)
     IF you received an x-ray today, you will receive an invoice from [facility]. Please contact [facility] at [phone] with questions or concerns regarding your invoice.   IF you received labwork today, you will receive an invoice from [facility]. Please contact [facility] at [phone] with questions or concerns regarding your invoice.   Our billing staff will not be able to assist you with questions regarding bills from these companies.  You will be contacted with the lab results as soon as they are available. The fastest way to get your results is to activate your My Chart account. Instructions are located on the last page of this paperwork. If you have not heard from us regarding the results in 2 weeks, please contact this office.      

## 2015-11-24 ENCOUNTER — Ambulatory Visit (INDEPENDENT_AMBULATORY_CARE_PROVIDER_SITE_OTHER): Payer: 59 | Admitting: Physician Assistant

## 2015-11-24 DIAGNOSIS — J069 Acute upper respiratory infection, unspecified: Secondary | ICD-10-CM | POA: Diagnosis not present

## 2015-11-24 MED ORDER — FLUTICASONE PROPIONATE 50 MCG/ACT NA SUSP: 2.0000 | Freq: Every day | NASAL | Status: DC

## 2015-11-24 MED ORDER — GUAIFENESIN ER 1200 MG PO TB12: 1.0000 | ORAL_TABLET | Freq: Two times a day (BID) | ORAL | Status: AC

## 2015-11-24 NOTE — Patient Instructions (Addendum)
  Please push fluids.  Tylenol and Motrin for fever and body aches.    A humidifier can help especially when the air is dry -if you do not have a humidifier you can boil a pot of water on the stove in your home to help with the dry air.    IF you received an x-ray today, you will receive an invoice from [facility]. Please contact [facility] at [phone] with questions or concerns regarding your invoice.   IF you received labwork today, you will receive an invoice from [facility]. Please contact [facility] at [phone] with questions or concerns regarding your invoice.   Our billing staff will not be able to assist you with questions regarding bills from these companies.  You will be contacted with the lab results as soon as they are available. The fastest way to get your results is to activate your My Chart account. Instructions are located on the last page of this paperwork. If you have not heard from [place] regarding the results in 2 weeks, please contact this office.

## 2015-11-24 NOTE — Progress Notes (Signed)
Doris Armstrong  MRN: [id number] DOB: 06-Jan-1976  Subjective:  Pt presents to clinic with cold symptoms for about 1.5 weeks.  She is having nasal congestion with PND without much rhinorrhea. She has been out of her flonase for about a month.  Some sinus rinses at home Her son was sick last week and he is better now.  Patient Active Problem List   Diagnosis Date Noted  . Essential hypertension 10/06/2015  . Hyperglycemia 10/06/2015  . Elevated LFTs 07/21/2014    Current Outpatient Prescriptions on File Prior to Visit  Medication Sig Dispense Refill  . carvedilol (COREG) 12.5 MG tablet Take 12.5 mg by mouth 2 (two) times daily with a meal.    . losartan-hydrochlorothiazide (HYZAAR) 50-12.5 MG per tablet Take 1 tablet by mouth daily.    . metFORMIN (GLUCOPHAGE) 500 MG tablet Take by mouth 2 (two) times daily with a meal. Reported on 10/06/2015     No current facility-administered medications on file prior to visit.    Allergies  Allergen Reactions  . Lisinopril Swelling    Review of Systems  Constitutional: Positive for fever (subjective) and chills.  HENT: Positive for congestion, postnasal drip and sore throat. Negative for dental problem and rhinorrhea.   Respiratory: Negative for cough.   Gastrointestinal: Negative.   Neurological: Negative for headaches.  Psychiatric/Behavioral: Negative for sleep disturbance.   Objective:  BP 122/78 mmHg  Pulse 79  Temp(Src) 97.9 F (36.6 C) (Oral)  Resp 15  Ht 6' (1.829 m)  Wt 326 lb 9.6 oz (148.145 kg)  BMI 44.29 kg/m2  SpO2 97%  LMP 11/05/2015 (Approximate)  Physical Exam  Constitutional: She is oriented to person, place, and time and well-developed, well-nourished, and in no distress.  HENT:  Head: Normocephalic and atraumatic.  Right Ear: Hearing, tympanic membrane, external ear and ear canal normal.  Left Ear: Hearing, tympanic membrane, external ear and ear canal normal.  Nose: Mucosal edema (red) present. Right  sinus exhibits maxillary sinus tenderness. Left sinus exhibits maxillary sinus tenderness.  Mouth/Throat: Uvula is midline, oropharynx is clear and moist and mucous membranes are normal.  Eyes: Conjunctivae are normal.  Neck: Normal range of motion.  Cardiovascular: Normal rate, regular rhythm and normal heart sounds.   No murmur heard. Pulmonary/Chest: Effort normal and breath sounds normal.  Neurological: She is alert and oriented to person, place, and time. Gait normal.  Skin: Skin is warm and dry.  Psychiatric: Mood, memory, affect and judgment normal.  Vitals reviewed.   Assessment and Plan :  Acute URI - Plan: Guaifenesin (MUCINEX MAXIMUM STRENGTH) 1200 MG TB12, fluticasone (FLONASE) 50 MCG/ACT nasal spray, Measure blood pressure, Care order/instruction   Symptomatic care d/w pt - she will let me know in 5-7 days if her symptoms are not improving as she might need an abx at that time to cover for bacterial sinus infection.  Benny Lennert PA-C  [facility] 11/24/2015 10:25 AM

## 2016-02-15 ENCOUNTER — Ambulatory Visit (INDEPENDENT_AMBULATORY_CARE_PROVIDER_SITE_OTHER): Payer: 59 | Admitting: Family Medicine

## 2016-02-15 ENCOUNTER — Encounter: Payer: Self-pay | Admitting: Family Medicine

## 2016-02-15 VITALS — BP 126/80 | HR 96 | Temp 102.3°F | Resp 18 | Ht 72.0 in | Wt 316.0 lb

## 2016-02-15 DIAGNOSIS — R509 Fever, unspecified: Secondary | ICD-10-CM | POA: Diagnosis not present

## 2016-02-15 DIAGNOSIS — A94 Unspecified arthropod-borne viral fever: Secondary | ICD-10-CM

## 2016-02-15 LAB — POCT CBC
GRANULOCYTE PERCENT: 60.5 % (ref 37–80)
HEMATOCRIT: 41.7 % (ref 37.7–47.9)
Hemoglobin: 14.3 g/dL (ref 12.2–16.2)
Lymph, poc: 1.1 (ref 0.6–3.4)
MCH: 31.6 pg — AB (ref 27–31.2)
MCHC: 34.2 g/dL (ref 31.8–35.4)
MCV: 92.4 fL (ref 80–97)
MID (CBC): 0.3 (ref 0–0.9)
MPV: 8.2 fL (ref 0–99.8)
POC GRANULOCYTE: 2.1 (ref 2–6.9)
POC LYMPH %: 31.1 % (ref 10–50)
POC MID %: 8.4 %M (ref 0–12)
Platelet Count, POC: 150 10*3/uL (ref 142–424)
RBC: 4.52 M/uL (ref 4.04–5.48)
RDW, POC: 14.7 %
WBC: 3.5 10*3/uL — AB (ref 4.6–10.2)

## 2016-02-15 LAB — POCT RAPID STREP A (OFFICE): Rapid Strep A Screen: NEGATIVE

## 2016-02-15 NOTE — Patient Instructions (Addendum)
It was good to see you today.    I think you most likely have a mosquito-borne virus.   This should run its course within the week.    If you start noticing acute worsening, come back and see [place], or head to the ED if you start having nausea with vomiting, worsening fevers, or aren't just getting better.      IF you received an x-ray today, you will receive an invoice from [facility]. Please contact [facility] at [phone] with questions or concerns regarding your invoice.   IF you received labwork today, you will receive an invoice from [facility]. Please contact [facility] at [phone] with questions or concerns regarding your invoice.   Our billing staff will not be able to assist you with questions regarding bills from these companies.  You will be contacted with the lab results as soon as they are available. The fastest way to get your results is to activate your My Chart account. Instructions are located on the last page of this paperwork. If you have not heard from [place] regarding the results in 2 weeks, please contact this office.

## 2016-02-15 NOTE — Progress Notes (Signed)
Doris Armstrong is a 40 y.o. female who presents to Urgent Care today for fever and general aches/malaise:  1.  Fever and malaise:  Patient traveled to the [place] on a cruise from August 10 - 17, one week ago.  Four days after returning, she began to have aching, fatigue, chills, and fevers.  Also headaches.  Waxes and wanes throughout the day.  Has persisted since then.  She has been taking Ibuprofen 600 mg until today, when she dropped down to 400 mg.  Highest temp at home was 101.6.  She denies any runny nose, cough, sinus congestion.  Sore throat which started yesterday, only on Right side of her throat when she eats.  Not painful at rest.    She drove 5 hours from here to [place] and the cruise left from there.  Did not drive.  Had several stops while driving.  On the cruise, she visited several islands.  She swam in the ocean.  She drank 1 punch with some ice.  She did not eat any food.  She has had no diarrhea.  No abdominal pain.  Some mild nausea for past day.  No vomiting.  She has had less of an appetite past several days but drinking fluids well and staying hydrated.  Other members of her family also traveled with her.  One friend had URI symptoms which have since resolved, without fevers.  Her 41 year old felt subjectively warm for a few days, but she didn't take his temperature and he otherwise was acting normally.    Did not attend [place] or [place]. Some EtOH use on the boat.  No illicit drugs.   General malaise.  Fatigue when walking around.  No DOE.  No chest pain.  No LE edema.  No redness of extremities.  No palpitations.  No tick bites.   ROS as above.    PMH reviewed. Patient is a nonsmoker.   Past Medical History:  Diagnosis Date  . Diabetes mellitus   . Diabetes mellitus without complication ([place])   . Gall bladder stones   . Hypertension   . PCOS (polycystic ovarian syndrome)    History reviewed. No pertinent surgical history.  Medications  reviewed. Current Outpatient Prescriptions  Medication Sig Dispense Refill  . carvedilol (COREG) 12.5 MG tablet Take 12.5 mg by mouth 2 (two) times daily with a meal.    . fluticasone (FLONASE) 50 MCG/ACT nasal spray Place 2 sprays into both nostrils daily. 16 g 10  . losartan-hydrochlorothiazide (HYZAAR) 50-12.5 MG per tablet Take 1 tablet by mouth daily.    . metFORMIN (GLUCOPHAGE) 500 MG tablet Take by mouth 2 (two) times daily with a meal. Reported on 10/06/2015     No current facility-administered medications for this visit.    Results for orders placed or performed in visit on 02/15/16  POCT CBC  Result Value Ref Range   WBC 3.5 (A) 4.6 - 10.2 K/uL   Lymph, poc 1.1 0.6 - 3.4   POC LYMPH PERCENT 31.1 10 - 50 %L   MID (cbc) 0.3 0 - 0.9   POC MID % 8.4 0 - 12 %M   POC Granulocyte 2.1 2 - 6.9   Granulocyte percent 60.5 37 - 80 %G   RBC 4.52 4.04 - 5.48 M/uL   Hemoglobin 14.3 12.2 - 16.2 g/dL   HCT, POC 41.7 37.7 - 47.9 %   MCV 92.4 80 - 97 fL   MCH, POC 31.6 (A) 27 - 31.2  pg   MCHC 34.2 31.8 - 35.4 g/dL   RDW, POC 14.7 %   Platelet Count, POC 150 142 - 424 K/uL   MPV 8.2 0 - 99.8 fL  POCT rapid strep A  Result Value Ref Range   Rapid Strep A Screen Negative Negative   Physical Exam:  BP 126/80 (BP Location: Right Arm, Patient Position: Sitting, Cuff Size: Large)   Pulse 94   Temp (!) 102.3 F (39.1 C) (Oral)   Resp 18   Ht 6' (1.829 m)   Wt (!) 316 lb (143.3 kg)   LMP 01/05/2016   SpO2 96%   BMI 42.86 kg/m  Gen:  Alert, cooperative patient who appears stated age in no acute distress.  Vital signs reviewed. HEENT:  [place]/AT.  EOMI, PERRL.  MMM, tonsils non-erythematous, non-edematous.  External ears WNL, Bilateral TM's normal without retraction, redness or bulging.  Neck:  Supple, no stiffness.  No LAD Pulm:  Clear to auscultation bilaterally with good air movement.  No wheezes or rales noted.   Cardiac:  Regular rate and rhythm without murmur auscultated.  Good  S1/S2. Abd:  Soft/nondistended/nontender.   Exts: Non edematous BL  LE, warm and well perfused.  Neuro:  A&O x 4.  No focal deficits.    Ambulatory pulse ox:  98% at rest.  96% when walking around the office.  HR went from 95 to 102. Remained regular.    Assessment and Plan:  1.  Fever in the returned traveler: - I think this is most likely an arbovirus, such as dengue or Chikungunya.   - she did not go to a malarial endemic region.  She has had no change in her bowels.  No diarrhea.  No vomiting.  Some mild nausea.  - She has generalized aches, chills, fatigue, and headache but has no other URI symptoms which would make me think flu.  No other sick contacts.   - Negative strep test here -- she did mention right sided mild sore throat.  I do not see erythema in her oropharynx.  Checking EBV - very low liklihood of PE.  Did travel by car 5 hours.  But stopped often.  Did not fly.  Remained active on the cruise.  She only has fevers.  No LE edema, redness, DOE, SOB.  No tachycardia.  Her pulse ox remained good.    2.  Arbovirus:  - most likely diagnosis. - Relative leukopenia here.  No lymphocytic or neutrophilic shift.  - Checking CMET for LFTs.  She had a relative drop in her platelets, but no bleeding or signs of hemorrhage.   - warning precautions provided.  - no signs of meningitis or encephalitis.

## 2016-02-16 ENCOUNTER — Telehealth: Payer: Self-pay | Admitting: Family Medicine

## 2016-02-16 DIAGNOSIS — R7989 Other specified abnormal findings of blood chemistry: Secondary | ICD-10-CM

## 2016-02-16 LAB — COMPREHENSIVE METABOLIC PANEL
ALBUMIN: 3.9 g/dL (ref 3.6–5.1)
ALK PHOS: 68 U/L (ref 33–115)
ALT: 48 U/L — AB (ref 6–29)
AST: 39 U/L — AB (ref 10–30)
BUN: 12 mg/dL (ref 7–25)
CALCIUM: 9.4 mg/dL (ref 8.6–10.2)
CO2: 23 mmol/L (ref 20–31)
CREATININE: 1.13 mg/dL — AB (ref 0.50–1.10)
Chloride: 106 mmol/L (ref 98–110)
Glucose, Bld: 116 mg/dL — ABNORMAL HIGH (ref 65–99)
Potassium: 4.1 mmol/L (ref 3.5–5.3)
SODIUM: 137 mmol/L (ref 135–146)
TOTAL PROTEIN: 7 g/dL (ref 6.1–8.1)
Total Bilirubin: 0.4 mg/dL (ref 0.2–1.2)

## 2016-02-16 NOTE — Telephone Encounter (Signed)
I attempted to call patient with the results of her labs. In short, her liver enzymes were minimally elevated, but this is a long-term issue for her and not likely related to her currently illness.   Her kidney function was a little elevated.  She should come back on Saturday or Monday to have this rechecked and make sure it is improving or if it has changed.  I'll put in a future order.  I wasn't able to leave a message as her mailbox is full.  Can you try and contact her to give her the message?

## 2016-02-17 LAB — CULTURE, GROUP A STREP: ORGANISM ID, BACTERIA: NORMAL

## 2016-02-17 NOTE — Telephone Encounter (Signed)
Called, mailbox is full.

## 2016-02-19 LAB — EPSTEIN-BARR VIRUS VCA ANTIBODY PANEL
EBV NA IGG: 526 U/mL — AB
EBV VCA IgG: 576 U/mL — ABNORMAL HIGH

## 2016-02-22 NOTE — Telephone Encounter (Signed)
Pt advised and will come in!

## 2016-02-24 ENCOUNTER — Other Ambulatory Visit (INDEPENDENT_AMBULATORY_CARE_PROVIDER_SITE_OTHER): Payer: 59 | Admitting: Family Medicine

## 2016-02-24 DIAGNOSIS — R748 Abnormal levels of other serum enzymes: Secondary | ICD-10-CM | POA: Diagnosis not present

## 2016-02-24 DIAGNOSIS — R7989 Other specified abnormal findings of blood chemistry: Secondary | ICD-10-CM

## 2016-02-24 LAB — BASIC METABOLIC PANEL
BUN: 18 mg/dL (ref 7–25)
CO2: 26 mmol/L (ref 20–31)
CREATININE: 1.03 mg/dL (ref 0.50–1.10)
Calcium: 9.5 mg/dL (ref 8.6–10.2)
Chloride: 106 mmol/L (ref 98–110)
GLUCOSE: 105 mg/dL — AB (ref 65–99)
Potassium: 4.4 mmol/L (ref 3.5–5.3)
Sodium: 139 mmol/L (ref 135–146)

## 2016-03-07 ENCOUNTER — Telehealth: Payer: Self-pay

## 2016-03-07 NOTE — Telephone Encounter (Signed)
Patient would like a call once the labs have been reviewed.  [phone]

## 2016-03-08 NOTE — Telephone Encounter (Signed)
Spoke with patient regarding labs and need for ov for repeat labs, she verbalized understanding.

## 2016-10-01 ENCOUNTER — Ambulatory Visit (INDEPENDENT_AMBULATORY_CARE_PROVIDER_SITE_OTHER): Payer: Commercial Managed Care - HMO | Admitting: Physician Assistant

## 2016-10-01 VITALS — BP 104/67 | HR 66 | Temp 98.3°F | Resp 18 | Ht 72.0 in | Wt 262.0 lb

## 2016-10-01 DIAGNOSIS — I10 Essential (primary) hypertension: Secondary | ICD-10-CM | POA: Diagnosis not present

## 2016-10-01 DIAGNOSIS — R7303 Prediabetes: Secondary | ICD-10-CM | POA: Diagnosis not present

## 2016-10-01 DIAGNOSIS — Z7689 Persons encountering health services in other specified circumstances: Secondary | ICD-10-CM | POA: Diagnosis not present

## 2016-10-01 LAB — POCT GLYCOSYLATED HEMOGLOBIN (HGB A1C): HEMOGLOBIN A1C: 5.7

## 2016-10-01 MED ORDER — LOSARTAN POTASSIUM-HCTZ 50-12.5 MG PO TABS: 1.0000 | ORAL_TABLET | Freq: Every day | ORAL | 3 refills | Status: DC

## 2016-10-01 NOTE — Patient Instructions (Addendum)
If your pressure is greater than 135/85 then please send me a mychart message with your BP numbers. Check for at least 2 weeks daily.    Make an appointment for 6 months.  Keep up the wieght loss!    IF you received an x-ray today, you will receive an invoice from [facility]. Please contact [facility] at [phone] with questions or concerns regarding your invoice.   IF you received labwork today, you will receive an invoice from [facility]. Please contact [facility] at [phone] with questions or concerns regarding your invoice.   Our billing staff will not be able to assist you with questions regarding bills from these companies.  You will be contacted with the lab results as soon as they are available. The fastest way to get your results is to activate your My Chart account. Instructions are located on the last page of this paperwork. If you have not heard from [place] regarding the results in 2 weeks, please contact this office.

## 2016-10-01 NOTE — Progress Notes (Signed)
10/01/2016 3:46 PM   DOB: [birth year] / MRN: [id number]  SUBJECTIVE:  Doris Armstrong is a 41 yo female presenting for a diabetes check.  She is taking metformin 500 mg nightly but is prescribed BID.  She has lost 60 lbs as her work as changed and involves lots of physical activity. She is losing about 8 lbs per month. She denies sock and glove paresthesia.   Takes carvedilol bid and Hyzaar daily for BP control. She does not check her BP. Denies dizziness.  She denies a history of heart problems and gets her care through PCP, and no cardiac work up exist in [facility].      She is allergic to lisinopril.   She  has a past medical history of Diabetes mellitus; Diabetes mellitus without complication (HCC); Gall bladder stones; Hypertension; and PCOS (polycystic ovarian syndrome).    She  reports that she has quit smoking. She has never used smokeless tobacco. She reports that she drinks alcohol. She reports that she does not use drugs. She  has no sexual activity history on file. The patient  has no past surgical history on file.  Her family history is not on file.  Review of Systems  Constitutional: Negative for chills, diaphoresis and fever.  Respiratory: Negative for shortness of breath.   Cardiovascular: Negative for chest pain, orthopnea and leg swelling.  Gastrointestinal: Negative for nausea.  Skin: Negative for rash.  Neurological: Negative for dizziness.    The problem list and medications were reviewed and updated by myself where necessary and exist elsewhere in the encounter.   OBJECTIVE:  BP 104/67   Pulse 66   Temp 98.3 F (36.8 C) (Oral)   Resp 18   Ht 6' (1.829 m)   Wt 262 lb (118.8 kg)   SpO2 98%   BMI 35.53 kg/m   Physical Exam  Constitutional: She is oriented to person, place, and time. She appears well-developed. She is active.  Non-toxic appearance.  HENT:  Right Ear: Hearing, tympanic membrane, external ear and ear canal normal.  Left Ear: Hearing, tympanic  membrane, external ear and ear canal normal.  Nose: Nose normal. Right sinus exhibits no maxillary sinus tenderness and no frontal sinus tenderness. Left sinus exhibits no maxillary sinus tenderness and no frontal sinus tenderness.  Mouth/Throat: Uvula is midline, oropharynx is clear and moist and mucous membranes are normal. Mucous membranes are not dry. No oropharyngeal exudate, posterior oropharyngeal edema or tonsillar abscesses.  Eyes: EOM are normal. Pupils are equal, round, and reactive to light.  Cardiovascular: Normal rate, regular rhythm, S1 normal, S2 normal, normal heart sounds and intact distal pulses.  Exam reveals no gallop, no friction rub and no decreased pulses.   No murmur heard. Pulmonary/Chest: Effort normal. No stridor. No tachypnea. No respiratory distress. She has no wheezes. She has no rales.  Abdominal: She exhibits no distension.  Musculoskeletal: She exhibits no edema.  Lymphadenopathy:       Head (right side): No submandibular and no tonsillar adenopathy present.       Head (left side): No submandibular and no tonsillar adenopathy present.    She has no cervical adenopathy.  Neurological: She is alert and oriented to person, place, and time. She has normal strength and normal reflexes. She is not disoriented. She displays no atrophy. No cranial nerve deficit or sensory deficit. She exhibits normal muscle tone. Coordination and gait normal.  Skin: Skin is warm and dry. She is not diaphoretic. No pallor.  Psychiatric: Her  behavior is normal.    Lab Results  Component Value Date   WBC 3.5 (A) 02/15/2016   HGB 14.3 02/15/2016   HCT 41.7 02/15/2016   MCV 92.4 02/15/2016   PLT 215 05/13/2012    Lab Results  Component Value Date   NA 139 02/24/2016   K 4.4 02/24/2016   CL 106 02/24/2016   CO2 26 02/24/2016   Lab Results  Component Value Date   CREATININE 1.03 02/24/2016   Lab Results  Component Value Date   ALT 48 (H) 02/15/2016   AST 39 (H) 02/15/2016    ALKPHOS 68 02/15/2016   BILITOT 0.4 02/15/2016   Lab Results  Component Value Date   TSH 1.494 01/07/2012   Wt Readings from Last 3 Encounters:  10/01/16 262 lb (118.8 kg)  02/15/16 (!) 316 lb (143.3 kg)  11/24/15 (!) 326 lb 9.6 oz (148.1 kg)   BP Readings from Last 3 Encounters:  10/01/16 104/67  02/15/16 126/80  11/24/15 122/78   Lab Results  Component Value Date   HGBA1C 5.7 10/01/2016     ASSESSMENT AND PLAN:  Doris Armstrong was seen today for labs only, bp check and medication refill.  Diagnoses and all orders for this visit:  Prediabetes: I am stopping her metformin as she is only taking one daily. She has lost 60 lbs intentionally since last being seen.  -     POCT glycosylated hemoglobin (Hb A1C) -     Cancel: HM Diabetes Foot Exam -     Cancel: Microalbumin, urine -     Cancel: Creatinine, serum -     Lipid panel  Essential hypertension: Continuing ARB-HCTZ.  Stopping carvedilol for now and she will check her BP daily for a week. -     CMP14+EGFR -     CBC -     losartan-hydrochlorothiazide (HYZAAR) 50-12.5 MG tablet; Take 1 tablet by mouth daily.    The patient is advised to call or return to clinic if she does not see an improvement in symptoms, or to seek the care of the closest emergency department if she worsens with the above plan.   Deliah Boston, MHS, PA-C [facility] 10/01/2016 3:46 PM

## 2016-10-02 LAB — CMP14+EGFR
A/G RATIO: 1.5 (ref 1.2–2.2)
ALBUMIN: 4 g/dL (ref 3.5–5.5)
ALT: 10 IU/L (ref 0–32)
AST: 12 IU/L (ref 0–40)
Alkaline Phosphatase: 63 IU/L (ref 39–117)
BUN / CREAT RATIO: 18 (ref 9–23)
BUN: 15 mg/dL (ref 6–24)
Bilirubin Total: 0.3 mg/dL (ref 0.0–1.2)
CALCIUM: 9.5 mg/dL (ref 8.7–10.2)
CO2: 24 mmol/L (ref 18–29)
Chloride: 102 mmol/L (ref 96–106)
Creatinine, Ser: 0.85 mg/dL (ref 0.57–1.00)
GFR calc non Af Amer: 85 mL/min/{1.73_m2} (ref >59)
GFR, EST AFRICAN AMERICAN: 98 mL/min/{1.73_m2} (ref >59)
GLOBULIN, TOTAL: 2.7 g/dL (ref 1.5–4.5)
Glucose: 88 mg/dL (ref 65–99)
Potassium: 4.5 mmol/L (ref 3.5–5.2)
SODIUM: 141 mmol/L (ref 134–144)
TOTAL PROTEIN: 6.7 g/dL (ref 6.0–8.5)

## 2016-10-02 LAB — CBC
Hematocrit: 43.7 % (ref 34.0–46.6)
Hemoglobin: 14.7 g/dL (ref 11.1–15.9)
MCH: 31.8 pg (ref 26.6–33.0)
MCHC: 33.6 g/dL (ref 31.5–35.7)
MCV: 95 fL (ref 79–97)
Platelets: 220 10*3/uL (ref 150–379)
RBC: 4.62 x10E6/uL (ref 3.77–5.28)
RDW: 14.1 % (ref 12.3–15.4)
WBC: 5.6 10*3/uL (ref 3.4–10.8)

## 2016-10-02 LAB — CREATININE, SERUM
Creatinine, Ser: 0.84 mg/dL (ref 0.57–1.00)
GFR calc Af Amer: 100 mL/min/{1.73_m2} (ref >59)
GFR calc non Af Amer: 87 mL/min/{1.73_m2} (ref >59)

## 2016-10-02 LAB — SPECIMEN STATUS REPORT

## 2016-10-02 LAB — LIPID PANEL
Chol/HDL Ratio: 2.6 ratio (ref 0.0–4.4)
Cholesterol, Total: 114 mg/dL (ref 100–199)
HDL: 44 mg/dL (ref >39)
LDL CALC: 65 mg/dL (ref 0–99)
TRIGLYCERIDES: 25 mg/dL (ref 0–149)
VLDL Cholesterol Cal: 5 mg/dL (ref 5–40)

## 2016-10-09 ENCOUNTER — Encounter: Payer: Self-pay | Admitting: Physician Assistant

## 2016-12-17 DIAGNOSIS — Z1231 Encounter for screening mammogram for malignant neoplasm of breast: Secondary | ICD-10-CM | POA: Diagnosis not present

## 2016-12-18 DIAGNOSIS — Z01419 Encounter for gynecological examination (general) (routine) without abnormal findings: Secondary | ICD-10-CM | POA: Diagnosis not present

## 2017-02-03 ENCOUNTER — Ambulatory Visit (INDEPENDENT_AMBULATORY_CARE_PROVIDER_SITE_OTHER): Payer: 59 | Admitting: Physician Assistant

## 2017-02-03 ENCOUNTER — Encounter: Payer: Self-pay | Admitting: Physician Assistant

## 2017-02-03 VITALS — BP 102/71 | HR 78 | Temp 98.3°F | Resp 16 | Ht 72.0 in | Wt 262.0 lb

## 2017-02-03 DIAGNOSIS — R1013 Epigastric pain: Secondary | ICD-10-CM

## 2017-02-03 MED ORDER — RANITIDINE HCL 150 MG PO TABS: 150.0000 mg | ORAL_TABLET | Freq: Two times a day (BID) | ORAL | 1 refills | Status: DC

## 2017-02-03 NOTE — Progress Notes (Signed)
PRIMARY CARE AT [facility] [address], [place] [address] [phone]  Date:  02/03/2017   Name:  Doris Armstrong   DOB:  1975-12-21   MRN:  [id number]  PCP:  Patient, No Pcp Per    History of Present Illness:  Doris Armstrong is a 41 y.o. female patient who presents to PCP with  Chief Complaint  Patient presents with  . Abdominal Pain    x 2 wks/ pt has  hx of gallstones     Upper abdomen underneath the breast Pain resurfaced about 2-3 weeks ago.  Generally occurs in the morning.  One night, when she had popcorn, she had the abdominal pain which appears to band around.  Time is the only thing that may help it.  Greasy stuff may aggravate her symptoms, but then this resolves.   No nausea or sour taste.  She states that the change in her weight were due to getting from a sedentary position with her work.    etOH use: 3 times per week, 4oz of liquor  Wt Readings from Last 3 Encounters:  02/03/17 262 lb (118.8 kg)  10/01/16 262 lb (118.8 kg)  02/15/16 (!) 316 lb (143.3 kg)     Patient Active Problem List   Diagnosis Date Noted  . Essential hypertension 10/06/2015  . Hyperglycemia 10/06/2015  . Elevated LFTs 07/21/2014    Past Medical History:  Diagnosis Date  . Diabetes mellitus   . Diabetes mellitus without complication (HCC)   . Gall bladder stones   . Hypertension   . PCOS (polycystic ovarian syndrome)     No past surgical history on file.  Social History  Substance Use Topics  . Smoking status: Former Games developer  . Smokeless tobacco: Never Used  . Alcohol use Yes    No family history on file.  Allergies  Allergen Reactions  . Lisinopril Swelling    Medication list has been reviewed and updated.  Current Outpatient Prescriptions on File Prior to Visit  Medication Sig Dispense Refill  . losartan-hydrochlorothiazide (HYZAAR) 50-12.5 MG tablet Take 1 tablet by mouth daily. 90 tablet 3  . carvedilol (COREG) 12.5 MG tablet Take 12.5 mg by mouth 2 (two)  times daily with a meal.     No current facility-administered medications on file prior to visit.     ROS ROS otherwise unremarkable unless listed above.  Physical Examination: BP 102/71   Pulse 78   Temp 98.3 F (36.8 C) (Oral)   Resp 16   Ht 6' (1.829 m)   Wt 262 lb (118.8 kg)   LMP 01/06/2017   SpO2 97%   BMI 35.53 kg/m  Ideal Body Weight: Weight in (lb) to have BMI = 25: 183.9  Physical Exam  Constitutional: She is oriented to person, place, and time. She appears well-developed and well-nourished. No distress.  HENT:  Head: Normocephalic and atraumatic.  Right Ear: External ear normal.  Left Ear: External ear normal.  Eyes: Pupils are equal, round, and reactive to light. Conjunctivae and EOM are normal.  Cardiovascular: Normal rate.   Pulmonary/Chest: Effort normal. No respiratory distress.  Abdominal: Soft. Normal appearance and bowel sounds are normal. There is tenderness in the epigastric area.  Neurological: She is alert and oriented to person, place, and time.  Skin: She is not diaphoretic.  Psychiatric: She has a normal mood and affect. Her behavior is normal.     Assessment and Plan: Doris Armstrong is a 41 y.o. female who is  here today for cc of upper abdominal pain Diff dx pancreatitis, gastritis, gerd, gallstones.  Will obtain labs.  If these are negative, will proceed with imaging. Abdominal pain, epigastric - Plan: ranitidine (ZANTAC) 150 MG tablet, CMP14+EGFR, Lipase, H. pylori breath test  Trena Platt, PA-C [facility] 8/17/20189:35 AM

## 2017-02-03 NOTE — Patient Instructions (Addendum)
   Food Choices for Gastroesophageal Reflux Disease, Adult When you have gastroesophageal reflux disease (GERD), the foods you eat and your eating habits are very important. Choosing the right foods can help ease your discomfort. What guidelines do I need to follow?  Choose fruits, vegetables, whole grains, and low-fat dairy products.  Choose low-fat meat, fish, and poultry.  Limit fats such as oils, salad dressings, butter, nuts, and avocado.  Keep a food diary. This helps you identify foods that cause symptoms.  Avoid foods that cause symptoms. These may be different for everyone.  Eat small meals often instead of 3 large meals a day.  Eat your meals slowly, in a place where you are relaxed.  Limit fried foods.  Cook foods using methods other than frying.  Avoid drinking alcohol.  Avoid drinking large amounts of liquids with your meals.  Avoid bending over or lying down until 2-3 hours after eating. What foods are not recommended? These are some foods and drinks that may make your symptoms worse: Vegetables Tomatoes. Tomato juice. Tomato and spaghetti sauce. Chili peppers. Onion and garlic. Horseradish. Fruits Oranges, grapefruit, and lemon (fruit and juice). Meats High-fat meats, fish, and poultry. This includes hot dogs, ribs, ham, sausage, salami, and bacon. Dairy Whole milk and chocolate milk. Sour cream. Cream. Butter. Ice cream. Cream cheese. Drinks Coffee and tea. Bubbly (carbonated) drinks or energy drinks. Condiments Hot sauce. Barbecue sauce. Sweets/Desserts Chocolate and cocoa. Donuts. Peppermint and spearmint. Fats and Oils High-fat foods. This includes French fries and potato chips. Other Vinegar. Strong spices. This includes black pepper, white pepper, red pepper, cayenne, curry powder, cloves, ginger, and chili powder. The items listed above may not be a complete list of foods and drinks to avoid. Contact your dietitian for more information. This  information is not intended to replace advice given to you by your health care provider. Make sure you discuss any questions you have with your health care provider. Document Released: 12/10/2011 Document Revised: 11/16/2015 Document Reviewed: 04/14/2013 Elsevier Interactive Patient Education  2017 Elsevier Inc.   IF you received an x-ray today, you will receive an invoice from [facility]. Please contact [facility] at [phone] with questions or concerns regarding your invoice.   IF you received labwork today, you will receive an invoice from [facility]. Please contact [facility] at [phone] with questions or concerns regarding your invoice.   Our billing staff will not be able to assist you with questions regarding bills from these companies.  You will be contacted with the lab results as soon as they are available. The fastest way to get your results is to activate your My Chart account. Instructions are located on the last page of this paperwork. If you have not heard from us regarding the results in 2 weeks, please contact this office.      

## 2017-02-04 LAB — LIPASE: Lipase: 43 U/L (ref 14–72)

## 2017-02-04 LAB — CMP14+EGFR
ALT: 11 IU/L (ref 0–32)
AST: 14 IU/L (ref 0–40)
Albumin/Globulin Ratio: 1.5 (ref 1.2–2.2)
Albumin: 4.4 g/dL (ref 3.5–5.5)
Alkaline Phosphatase: 72 IU/L (ref 39–117)
BUN/Creatinine Ratio: 17 (ref 9–23)
BUN: 14 mg/dL (ref 6–24)
Bilirubin Total: 0.2 mg/dL (ref 0.0–1.2)
CALCIUM: 9.3 mg/dL (ref 8.7–10.2)
CO2: 23 mmol/L (ref 20–29)
Chloride: 104 mmol/L (ref 96–106)
Creatinine, Ser: 0.81 mg/dL (ref 0.57–1.00)
GFR calc non Af Amer: 90 mL/min/{1.73_m2} (ref >59)
GFR, EST AFRICAN AMERICAN: 104 mL/min/{1.73_m2} (ref >59)
GLUCOSE: 84 mg/dL (ref 65–99)
Globulin, Total: 3 g/dL (ref 1.5–4.5)
Potassium: 4.5 mmol/L (ref 3.5–5.2)
Sodium: 141 mmol/L (ref 134–144)
TOTAL PROTEIN: 7.4 g/dL (ref 6.0–8.5)

## 2017-02-04 LAB — H. PYLORI BREATH TEST: H pylori Breath Test: NEGATIVE

## 2017-02-25 ENCOUNTER — Other Ambulatory Visit: Payer: Self-pay | Admitting: Physician Assistant

## 2017-02-25 DIAGNOSIS — R1011 Right upper quadrant pain: Secondary | ICD-10-CM

## 2017-04-01 ENCOUNTER — Other Ambulatory Visit: Payer: Self-pay | Admitting: Physician Assistant

## 2017-04-01 DIAGNOSIS — R1013 Epigastric pain: Secondary | ICD-10-CM

## 2017-04-10 DIAGNOSIS — Z23 Encounter for immunization: Secondary | ICD-10-CM | POA: Diagnosis not present

## 2017-06-11 ENCOUNTER — Other Ambulatory Visit: Payer: Self-pay | Admitting: Physician Assistant

## 2017-06-11 ENCOUNTER — Other Ambulatory Visit: Payer: Self-pay | Admitting: *Deleted

## 2017-06-11 NOTE — Telephone Encounter (Signed)
Needs a verbal order for the Hyzaar.   Also wants to change to [facility].   I don't know which [facility] pharmacy to switch this to.  Thanks

## 2017-06-11 NOTE — Telephone Encounter (Signed)
Copied from [place]. Topic: Quick Communication - Rx Refill/Question >> Jun 11, 2017 10:16 AM Patrice Paradise wrote: Has the patient contacted their pharmacy? Yes.   losartan-hydrochlorothiazide (HYZAAR) 50-12.5 MG tablet  (REFILL REQUEST WAS FAXED OVER ON 12/11 & 12/13.  (Agent: If no, request that the patient contact the pharmacy for the refill.)  Preferred Pharmacy (with phone number or street name):  [facility] [id number] Ref# [id number] (JUST NEED VERBAL ORDERS)  Agent: Please be advised that RX refills may take up to 3 business days. We ask that you follow-up with your pharmacy.

## 2017-06-12 ENCOUNTER — Other Ambulatory Visit: Payer: Self-pay

## 2017-06-12 MED ORDER — LOSARTAN POTASSIUM-HCTZ 50-12.5 MG PO TABS: 1.0000 | ORAL_TABLET | Freq: Every day | ORAL | 1 refills | Status: DC

## 2017-06-12 NOTE — Telephone Encounter (Signed)
Re ordered via electronic - remaining prescription #90 with 1 RF to [facility].

## 2017-06-24 DIAGNOSIS — Z8619 Personal history of other infectious and parasitic diseases: Secondary | ICD-10-CM

## 2017-06-24 HISTORY — DX: Personal history of other infectious and parasitic diseases: Z86.19

## 2017-10-01 ENCOUNTER — Encounter: Payer: Self-pay | Admitting: Physician Assistant

## 2017-10-01 DIAGNOSIS — M79672 Pain in left foot: Secondary | ICD-10-CM | POA: Diagnosis not present

## 2017-10-01 DIAGNOSIS — M21962 Unspecified acquired deformity of left lower leg: Secondary | ICD-10-CM | POA: Diagnosis not present

## 2017-10-01 DIAGNOSIS — M79671 Pain in right foot: Secondary | ICD-10-CM | POA: Diagnosis not present

## 2017-10-01 DIAGNOSIS — B351 Tinea unguium: Secondary | ICD-10-CM | POA: Diagnosis not present

## 2017-10-17 ENCOUNTER — Encounter: Payer: Self-pay | Admitting: Urgent Care

## 2017-10-17 ENCOUNTER — Ambulatory Visit: Payer: 59 | Admitting: Urgent Care

## 2017-10-17 VITALS — BP 132/90 | HR 75 | Temp 97.8°F | Resp 18 | Ht 72.0 in | Wt 259.2 lb

## 2017-10-17 DIAGNOSIS — M545 Low back pain, unspecified: Secondary | ICD-10-CM

## 2017-10-17 DIAGNOSIS — Z6835 Body mass index (BMI) 35.0-35.9, adult: Secondary | ICD-10-CM

## 2017-10-17 DIAGNOSIS — I10 Essential (primary) hypertension: Secondary | ICD-10-CM | POA: Diagnosis not present

## 2017-10-17 DIAGNOSIS — E6609 Other obesity due to excess calories: Secondary | ICD-10-CM | POA: Diagnosis not present

## 2017-10-17 LAB — COMPREHENSIVE METABOLIC PANEL
ALT: 15 IU/L (ref 0–32)
AST: 16 IU/L (ref 0–40)
Albumin/Globulin Ratio: 1.4 (ref 1.2–2.2)
Albumin: 4.2 g/dL (ref 3.5–5.5)
Alkaline Phosphatase: 65 IU/L (ref 39–117)
BUN/Creatinine Ratio: 20 (ref 9–23)
BUN: 17 mg/dL (ref 6–24)
Bilirubin Total: 0.3 mg/dL (ref 0.0–1.2)
CALCIUM: 9.3 mg/dL (ref 8.7–10.2)
CO2: 23 mmol/L (ref 20–29)
Chloride: 102 mmol/L (ref 96–106)
Creatinine, Ser: 0.86 mg/dL (ref 0.57–1.00)
GFR, EST AFRICAN AMERICAN: 96 mL/min/{1.73_m2} (ref >59)
GFR, EST NON AFRICAN AMERICAN: 84 mL/min/{1.73_m2} (ref >59)
Globulin, Total: 3 g/dL (ref 1.5–4.5)
Glucose: 96 mg/dL (ref 65–99)
Potassium: 4.3 mmol/L (ref 3.5–5.2)
Sodium: 143 mmol/L (ref 134–144)
TOTAL PROTEIN: 7.2 g/dL (ref 6.0–8.5)

## 2017-10-17 LAB — LIPID PANEL
CHOL/HDL RATIO: 2.4 ratio (ref 0.0–4.4)
Cholesterol, Total: 152 mg/dL (ref 100–199)
HDL: 63 mg/dL (ref >39)
LDL CALC: 83 mg/dL (ref 0–99)
TRIGLYCERIDES: 29 mg/dL (ref 0–149)
VLDL Cholesterol Cal: 6 mg/dL (ref 5–40)

## 2017-10-17 MED ORDER — LOSARTAN POTASSIUM-HCTZ 50-12.5 MG PO TABS: 1.0000 | ORAL_TABLET | Freq: Every day | ORAL | 1 refills | Status: DC

## 2017-10-17 NOTE — Progress Notes (Signed)
MRN: [id number] DOB: 09-24-1975  Subjective:   Doris Armstrong is a 42 y.o. female presenting for follow up on Hypertension. Currently managed with losartan and hydrochlorothiazide. Patient is checking blood pressure at home, generally 120's systolic. Reports intermittent daily low back pain.  Reports that she drinks 1 to 2 glasses of water daily.  She smokes cigars on occasion.  She has about 7-10 drinks of alcohol per week. Denies dizziness, chronic headache, blurred vision, chest pain, shortness of breath, heart racing, palpitations, nausea, vomiting, abdominal pain, hematuria, lower leg swelling.   Doris Armstrong has a current medication list which includes the following prescription(s): losartan-hydrochlorothiazide and ranitidine. Also is allergic to lisinopril.  Doris Armstrong  has a past medical history of Diabetes mellitus, Diabetes mellitus without complication (HCC), Gall bladder stones, Hypertension, and PCOS (polycystic ovarian syndrome). Denies past surgical history.  Objective:   Vitals: BP 132/90   Pulse 75   Temp 97.8 F (36.6 C) (Oral)   Resp 18   Ht 6' (1.829 m)   Wt 259 lb 3.2 oz (117.6 kg)   SpO2 98%   BMI 35.15 kg/m   BP Readings from Last 3 Encounters:  10/17/17 132/90  02/03/17 102/71  10/01/16 104/67    Physical Exam  Constitutional: She is oriented to person, place, and time. She appears well-developed and well-nourished.  HENT:  Mouth/Throat: Oropharynx is clear and moist.  Eyes: No scleral icterus.  Cardiovascular: Normal rate, regular rhythm and intact distal pulses. Exam reveals no gallop and no friction rub.  No murmur heard. Pulmonary/Chest: Effort normal. No respiratory distress. She has no wheezes. She has no rales.  Neurological: She is alert and oriented to person, place, and time.  Psychiatric: She has a normal mood and affect.   Assessment and Plan :   Essential hypertension - Plan: Comprehensive metabolic panel, Lipid panel  Class 2 obesity due to  excess calories without serious comorbidity with body mass index (BMI) of 35.0 to 35.9 in adult  Acute bilateral low back pain without sciatica  Recommended patient hydrate much better for her back pain.  She has used ibuprofen with good relief.  I recommend she use Tylenol with ibuprofen in the meantime while she works on hard hydration.  Patient reports that she has done very well with losartan despite her allergic reaction to lisinopril.  Her blood pressure stable and I will refill her current medication regimen.  Follow-up in 6 months.  Parameters given for her blood pressure to come back sooner than 6 months if it remains higher than 140 systolic.  Wallis Bamberg, PA-C Primary [facility] at [facility] [phone] 10/17/2017  10:12 AM

## 2017-10-17 NOTE — Patient Instructions (Addendum)
Hydrate well with at least 2 liters (1 gallon) of water daily. You may take 500mg  Tylenol with ibuprofen 600mg  every 6 hours with food for back pain.      Managing Your Hypertension Hypertension is commonly called high blood pressure. This is when the force of your blood pressing against the walls of your arteries is too strong. Arteries are blood vessels that carry blood from your heart throughout your body. Hypertension forces the heart to work harder to pump blood, and may cause the arteries to become narrow or stiff. Having untreated or uncontrolled hypertension can cause heart attack, stroke, kidney disease, and other problems. What are blood pressure readings? A blood pressure reading consists of a higher number over a lower number. Ideally, your blood pressure should be below 120/80. The first ("top") number is called the systolic pressure. It is a measure of the pressure in your arteries as your heart beats. The second ("bottom") number is called the diastolic pressure. It is a measure of the pressure in your arteries as the heart relaxes. What does my blood pressure reading mean? Blood pressure is classified into four stages. Based on your blood pressure reading, your health care provider may use the following stages to determine what type of treatment you need, if any. Systolic pressure and diastolic pressure are measured in a unit called mm Hg. Normal  Systolic pressure: below 626.  Diastolic pressure: below 80. Elevated  Systolic pressure: 948-546.  Diastolic pressure: below 80. Hypertension stage 1  Systolic pressure: 270-350.  Diastolic pressure: 09-38. Hypertension stage 2  Systolic pressure: 182 or above.  Diastolic pressure: 90 or above. What health risks are associated with hypertension? Managing your hypertension is an important responsibility. Uncontrolled hypertension can lead to:  A heart attack.  A stroke.  A weakened blood vessel (aneurysm).  Heart  failure.  Kidney damage.  Eye damage.  Metabolic syndrome.  Memory and concentration problems.  What changes can I make to manage my hypertension? Hypertension can be managed by making lifestyle changes and possibly by taking medicines. Your health care provider will help you make a plan to bring your blood pressure within a normal range. Eating and drinking  Eat a diet that is high in fiber and potassium, and low in salt (sodium), added sugar, and fat. An example eating plan is called the DASH (Dietary Approaches to Stop Hypertension) diet. To eat this way: ? Eat plenty of fresh fruits and vegetables. Try to fill half of your plate at each meal with fruits and vegetables. ? Eat whole grains, such as whole wheat pasta, brown rice, or whole grain bread. Fill about one quarter of your plate with whole grains. ? Eat low-fat diary products. ? Avoid fatty cuts of meat, processed or cured meats, and poultry with skin. Fill about one quarter of your plate with lean proteins such as fish, chicken without skin, beans, eggs, and tofu. ? Avoid premade and processed foods. These tend to be higher in sodium, added sugar, and fat.  Reduce your daily sodium intake. Most people with hypertension should eat less than 1,500 mg of sodium a day.  Limit alcohol intake to no more than 1 drink a day for nonpregnant women and 2 drinks a day for men. One drink equals 12 oz of beer, 5 oz of wine, or 1 oz of hard liquor. Lifestyle  Work with your health care provider to maintain a healthy body weight, or to lose weight. Ask what an ideal weight is for  you.  Get at least 30 minutes of exercise that causes your heart to beat faster (aerobic exercise) most days of the week. Activities may include walking, swimming, or biking.  Include exercise to strengthen your muscles (resistance exercise), such as weight lifting, as part of your weekly exercise routine. Try to do these types of exercises for 30 minutes at least  3 days a week.  Do not use any products that contain nicotine or tobacco, such as cigarettes and e-cigarettes. If you need help quitting, ask your health care provider.  Control any long-term (chronic) conditions you have, such as high cholesterol or diabetes. Monitoring  Monitor your blood pressure at home as told by your health care provider. Your personal target blood pressure may vary depending on your medical conditions, your age, and other factors.  Have your blood pressure checked regularly, as often as told by your health care provider. Working with your health care provider  Review all the medicines you take with your health care provider because there may be side effects or interactions.  Talk with your health care provider about your diet, exercise habits, and other lifestyle factors that may be contributing to hypertension.  Visit your health care provider regularly. Your health care provider can help you create and adjust your plan for managing hypertension. Will I need medicine to control my blood pressure? Your health care provider may prescribe medicine if lifestyle changes are not enough to get your blood pressure under control, and if:  Your systolic blood pressure is 130 or higher.  Your diastolic blood pressure is 80 or higher.  Take medicines only as told by your health care provider. Follow the directions carefully. Blood pressure medicines must be taken as prescribed. The medicine does not work as well when you skip doses. Skipping doses also puts you at risk for problems. Contact a health care provider if:  You think you are having a reaction to medicines you have taken.  You have repeated (recurrent) headaches.  You feel dizzy.  You have swelling in your ankles.  You have trouble with your vision. Get help right away if:  You develop a severe headache or confusion.  You have unusual weakness or numbness, or you feel faint.  You have severe pain in  your chest or abdomen.  You vomit repeatedly.  You have trouble breathing. Summary  Hypertension is when the force of blood pumping through your arteries is too strong. If this condition is not controlled, it may put you at risk for serious complications.  Your personal target blood pressure may vary depending on your medical conditions, your age, and other factors. For most people, a normal blood pressure is less than 120/80.  Hypertension is managed by lifestyle changes, medicines, or both. Lifestyle changes include weight loss, eating a healthy, low-sodium diet, exercising more, and limiting alcohol. This information is not intended to replace advice given to you by your health care provider. Make sure you discuss any questions you have with your health care provider. Document Released: 03/04/2012 Document Revised: 05/08/2016 Document Reviewed: 05/08/2016 Elsevier Interactive Patient Education  2018 [employer].     IF you received an x-ray today, you will receive an invoice from [facility]. Please contact [facility] at [phone] with questions or concerns regarding your invoice.   IF you received labwork today, you will receive an invoice from [place]. Please contact [facility] at [phone] with questions or concerns regarding your invoice.   Our billing staff will not be  able to assist you with questions regarding bills from these companies.  You will be contacted with the lab results as soon as they are available. The fastest way to get your results is to activate your My Chart account. Instructions are located on the last page of this paperwork. If you have not heard from [place] regarding the results in 2 weeks, please contact this office.

## 2017-11-07 DIAGNOSIS — M21961 Unspecified acquired deformity of right lower leg: Secondary | ICD-10-CM | POA: Diagnosis not present

## 2017-11-07 DIAGNOSIS — M79672 Pain in left foot: Secondary | ICD-10-CM | POA: Diagnosis not present

## 2017-11-07 DIAGNOSIS — M79671 Pain in right foot: Secondary | ICD-10-CM | POA: Diagnosis not present

## 2018-01-20 ENCOUNTER — Telehealth: Payer: Self-pay | Admitting: Urgent Care

## 2018-01-20 NOTE — Telephone Encounter (Unsigned)
Copied from [place] [id number]. Topic: Quick Communication - Rx Refill/Question >> Jan 20, 2018  5:43 PM Neva Seat wrote: losartan-hydrochlorothiazide (HYZAAR) 50-12.5 MG tablet   [facility] is out of medication and doesn't know when they will have the medication in to fill Rx. Told pt to call her PCP to have it sent into another pharmacy to fill.  [facility] [address] Phone: [phone]

## 2018-01-21 ENCOUNTER — Other Ambulatory Visit: Payer: Self-pay

## 2018-01-21 MED ORDER — LOSARTAN POTASSIUM-HCTZ 50-12.5 MG PO TABS: 1.0000 | ORAL_TABLET | Freq: Every day | ORAL | 1 refills | Status: DC

## 2018-02-26 ENCOUNTER — Telehealth: Payer: Self-pay | Admitting: Urgent Care

## 2018-02-26 NOTE — Telephone Encounter (Signed)
Called and spoke with pt. Informed her Doris Armstrong leaving the office and rescheduled her appt for 9/16 at 1:40 with [facility]. Advised pt of time, building and late policy.

## 2018-03-09 ENCOUNTER — Ambulatory Visit: Payer: 59 | Admitting: Urgent Care

## 2018-03-09 VITALS — BP 111/73 | HR 80 | Temp 98.0°F | Resp 16 | Ht 72.0 in | Wt 275.0 lb

## 2018-03-09 DIAGNOSIS — Z6835 Body mass index (BMI) 35.0-35.9, adult: Secondary | ICD-10-CM

## 2018-03-09 DIAGNOSIS — E6609 Other obesity due to excess calories: Secondary | ICD-10-CM | POA: Diagnosis not present

## 2018-03-09 DIAGNOSIS — M2550 Pain in unspecified joint: Secondary | ICD-10-CM | POA: Diagnosis not present

## 2018-03-09 DIAGNOSIS — R0789 Other chest pain: Secondary | ICD-10-CM

## 2018-03-09 DIAGNOSIS — I10 Essential (primary) hypertension: Secondary | ICD-10-CM | POA: Diagnosis not present

## 2018-03-09 DIAGNOSIS — R7303 Prediabetes: Secondary | ICD-10-CM | POA: Diagnosis not present

## 2018-03-09 MED ORDER — LOSARTAN POTASSIUM-HCTZ 50-12.5 MG PO TABS: 1.0000 | ORAL_TABLET | Freq: Every day | ORAL | 3 refills | Status: DC

## 2018-03-09 MED ORDER — MELOXICAM 7.5 MG PO TABS: 7.5000 mg | ORAL_TABLET | Freq: Every day | ORAL | 0 refills | Status: DC

## 2018-03-09 NOTE — Progress Notes (Signed)
MRN: [id number] DOB: 03-27-1976  Subjective:   Doris Armstrong is a 42 y.o. female presenting for follow up on Hypertension, pre-diabetes. Reports recent issue with pain and swelling of her joints in her hands, knees, shoulders. 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 Patient reports 1 episode of heart racing, upper left arm pain last night. Patient was laying down when this occurred, lasted ~20 minutes. Denies associated diaphoresis, jaw, neck or abdominal pain, n/v. Had this kind of pain ~3 years ago, had full cardiac work up which was negative.  reports that she has quit smoking. She has never used smokeless tobacco. She reports that she drinks alcohol. She reports that she does not use drugs. Has alcohol drink every night, 1-2 shots per night.   Doris Armstrong has a current medication list which includes the following prescription(s): losartan-hydrochlorothiazide and ranitidine. Also is allergic to lisinopril.  Doris Armstrong  has a past medical history of Diabetes mellitus, Diabetes mellitus without complication (HCC), Gall bladder stones, Hypertension, and PCOS (polycystic ovarian syndrome). Denies past surgical history.   Objective:   Vitals: BP 111/73   Pulse 80   Temp 98 F (36.7 C) (Oral)   Resp 16   Ht 6' (1.829 m)   Wt 275 lb (124.7 kg)   SpO2 96%   BMI 37.30 kg/m   Physical Exam  Constitutional: She is oriented to person, place, and time. She appears well-developed and well-nourished.  HENT:  Mouth/Throat: Oropharynx is clear and moist.  Eyes: Right eye exhibits no discharge. Left eye exhibits no discharge. No scleral icterus.  Cardiovascular: Normal rate, regular rhythm, normal heart sounds and intact distal pulses. Exam reveals no gallop and no friction rub.  No murmur heard. Pulmonary/Chest: Effort normal and breath sounds normal. No stridor. No respiratory distress. She has no wheezes. She has no rales.  Abdominal: Soft. Bowel sounds are normal. She exhibits no  distension and no mass. There is no tenderness. There is no rebound and no guarding.  Musculoskeletal: She exhibits no edema.  Neurological: She is alert and oriented to person, place, and time.  Skin: Skin is warm and dry. No rash noted. No erythema. No pallor.  Psychiatric: She has a normal mood and affect.   Assessment and Plan :   Essential hypertension  Class 2 obesity due to excess calories without serious comorbidity with body mass index (BMI) of 35.0 to 35.9 in adult  Prediabetes  Multiple joint pain  Atypical chest pain  Blood pressure is well controlled, refills provided for losartan hydrochlorothiazide.  We discussed the recall on and patient would like to remain on her current medication.  We discussed her chest pain as well, she refused an EKG today but will think about contacting her cardiologist and set up a recheck.  She will also monitor her symptoms at home and set up an office visit if her symptoms persist.  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  She has responded well with Tylenol and ibuprofen.  I offered meloxicam to use instead of ibuprofen.  Lifestyle modifications recommended, patient was very agreeable to this.  We will follow-up in 6 months to 1 year.  ER precautions reviewed for her atypical chest pain.  Wallis Bamberg, PA-C [facility] [phone] 03/09/2018  2:11 PM

## 2018-03-09 NOTE — Patient Instructions (Addendum)
You may take 500mg  Tylenol with ibuprofen 400-600mg  with food every 6 hours for pain and inflammation.    Salads - kale, spinach, cabbage, spring mix; use seeds like pumpkin seeds or sunflower seeds; you can also use 1-2 hard boiled eggs. Fruits - avocadoes, berries (blueberries, raspberries, blackberries), apples, oranges, pomegranate, grapefruit Seeds - quinoa, chia seeds; you can also incorporate oatmeal Vegetables - aspargus, cauliflower, broccoli, green beans, brussel spouts, bell peppers; stay away from starchy vegetables like potatoes, carrots, peas    Hypertension Hypertension, commonly called high blood pressure, is when the force of blood pumping through the arteries is too strong. The arteries are the blood vessels that carry blood from the heart throughout the body. Hypertension forces the heart to work harder to pump blood and may cause arteries to become narrow or stiff. Having untreated or uncontrolled hypertension can cause heart attacks, strokes, kidney disease, and other problems. A blood pressure reading consists of a higher number over a lower number. Ideally, your blood pressure should be below 120/80. The first ("top") number is called the systolic pressure. It is a measure of the pressure in your arteries as your heart beats. The second ("bottom") number is called the diastolic pressure. It is a measure of the pressure in your arteries as the heart relaxes. What are the causes? The cause of this condition is not known. What increases the risk? Some risk factors for high blood pressure are under your control. Others are not. Factors you can change  Smoking.  Having type 2 diabetes mellitus, high cholesterol, or both.  Not getting enough exercise or physical activity.  Being overweight.  Having too much fat, sugar, calories, or salt (sodium) in your diet.  Drinking too much alcohol. Factors that are difficult or impossible to change  Having chronic kidney  disease.  Having a family history of high blood pressure.  Age. Risk increases with age.  Race. You may be at higher risk if you are African-American.  Gender. Men are at higher risk than women before age 39. After age 68, women are at higher risk than men.  Having obstructive sleep apnea.  Stress. What are the signs or symptoms? Extremely high blood pressure (hypertensive crisis) may cause:  Headache.  Anxiety.  Shortness of breath.  Nosebleed.  Nausea and vomiting.  Severe chest pain.  Jerky movements you cannot control (seizures).  How is this diagnosed? This condition is diagnosed by measuring your blood pressure while you are seated, with your arm resting on a surface. The cuff of the blood pressure monitor will be placed directly against the skin of your upper arm at the level of your heart. It should be measured at least twice using the same arm. Certain conditions can cause a difference in blood pressure between your right and left arms. Certain factors can cause blood pressure readings to be lower or higher than normal (elevated) for a short period of time:  When your blood pressure is higher when you are in a health care provider's office than when you are at home, this is called white coat hypertension. Most people with this condition do not need medicines.  When your blood pressure is higher at home than when you are in a health care provider's office, this is called masked hypertension. Most people with this condition may need medicines to control blood pressure.  If you have a high blood pressure reading during one visit or you have normal blood pressure with other risk factors:  You  may be asked to return on a different day to have your blood pressure checked again.  You may be asked to monitor your blood pressure at home for 1 week or longer.  If you are diagnosed with hypertension, you may have other blood or imaging tests to help your health care provider  understand your overall risk for other conditions. How is this treated? This condition is treated by making healthy lifestyle changes, such as eating healthy foods, exercising more, and reducing your alcohol intake. Your health care provider may prescribe medicine if lifestyle changes are not enough to get your blood pressure under control, and if:  Your systolic blood pressure is above 130.  Your diastolic blood pressure is above 80.  Your personal target blood pressure may vary depending on your medical conditions, your age, and other factors. Follow these instructions at home: Eating and drinking  Eat a diet that is high in fiber and potassium, and low in sodium, added sugar, and fat. An example eating plan is called the DASH (Dietary Approaches to Stop Hypertension) diet. To eat this way: ? Eat plenty of fresh fruits and vegetables. Try to fill half of your plate at each meal with fruits and vegetables. ? Eat whole grains, such as whole wheat pasta, brown rice, or whole grain bread. Fill about one quarter of your plate with whole grains. ? Eat or drink low-fat dairy products, such as skim milk or low-fat yogurt. ? Avoid fatty cuts of meat, processed or cured meats, and poultry with skin. Fill about one quarter of your plate with lean proteins, such as fish, chicken without skin, beans, eggs, and tofu. ? Avoid premade and processed foods. These tend to be higher in sodium, added sugar, and fat.  Reduce your daily sodium intake. Most people with hypertension should eat less than 1,500 mg of sodium a day.  Limit alcohol intake to no more than 1 drink a day for nonpregnant women and 2 drinks a day for men. One drink equals 12 oz of beer, 5 oz of wine, or 1 oz of hard liquor. Lifestyle  Work with your health care provider to maintain a healthy body weight or to lose weight. Ask what an ideal weight is for you.  Get at least 30 minutes of exercise that causes your heart to beat faster  (aerobic exercise) most days of the week. Activities may include walking, swimming, or biking.  Include exercise to strengthen your muscles (resistance exercise), such as pilates or lifting weights, as part of your weekly exercise routine. Try to do these types of exercises for 30 minutes at least 3 days a week.  Do not use any products that contain nicotine or tobacco, such as cigarettes and e-cigarettes. If you need help quitting, ask your health care provider.  Monitor your blood pressure at home as told by your health care provider.  Keep all follow-up visits as told by your health care provider. This is important. Medicines  Take over-the-counter and prescription medicines only as told by your health care provider. Follow directions carefully. Blood pressure medicines must be taken as prescribed.  Do not skip doses of blood pressure medicine. Doing this puts you at risk for problems and can make the medicine less effective.  Ask your health care provider about side effects or reactions to medicines that you should watch for. Contact a health care provider if:  You think you are having a reaction to a medicine you are taking.  You have headaches  that keep coming back (recurring).  You feel dizzy.  You have swelling in your ankles.  You have trouble with your vision. Get help right away if:  You develop a severe headache or confusion.  You have unusual weakness or numbness.  You feel faint.  You have severe pain in your chest or abdomen.  You vomit repeatedly.  You have trouble breathing. Summary  Hypertension is when the force of blood pumping through your arteries is too strong. If this condition is not controlled, it may put you at risk for serious complications.  Your personal target blood pressure may vary depending on your medical conditions, your age, and other factors. For most people, a normal blood pressure is less than 120/80.  Hypertension is treated with  lifestyle changes, medicines, or a combination of both. Lifestyle changes include weight loss, eating a healthy, low-sodium diet, exercising more, and limiting alcohol. This information is not intended to replace advice given to you by your health care provider. Make sure you discuss any questions you have with your health care provider. Document Released: 06/10/2005 Document Revised: 05/08/2016 Document Reviewed: 05/08/2016 Elsevier Interactive Patient Education  [employer].    If you have lab work done today you will be contacted with your lab results within the next 2 weeks.  If you have not heard from [place] then please contact us. The fastest way to get your results is to register for My Chart.   IF you received an x-ray today, you will receive an invoice from [facility]. Please contact [facility] at [phone] with questions or concerns regarding your invoice.   IF you received labwork today, you will receive an invoice from [place]. Please contact [facility] at [phone] with questions or concerns regarding your invoice.   Our billing staff will not be able to assist you with questions regarding bills from these companies.  You will be contacted with the lab results as soon as they are available. The fastest way to get your results is to activate your My Chart account. Instructions are located on the last page of this paperwork. If you have not heard from [place] regarding the results in 2 weeks, please contact this office.

## 2018-03-20 ENCOUNTER — Ambulatory Visit (INDEPENDENT_AMBULATORY_CARE_PROVIDER_SITE_OTHER): Payer: 59 | Admitting: Family Medicine

## 2018-03-20 ENCOUNTER — Other Ambulatory Visit: Payer: Self-pay

## 2018-03-20 ENCOUNTER — Encounter: Payer: Self-pay | Admitting: Family Medicine

## 2018-03-20 VITALS — BP 120/79 | HR 70 | Temp 98.2°F | Resp 20 | Ht 72.0 in | Wt 281.2 lb

## 2018-03-20 DIAGNOSIS — Z23 Encounter for immunization: Secondary | ICD-10-CM | POA: Diagnosis not present

## 2018-03-20 DIAGNOSIS — R079 Chest pain, unspecified: Secondary | ICD-10-CM | POA: Diagnosis not present

## 2018-03-20 MED ORDER — CIPROFLOXACIN HCL 500 MG PO TABS: 500.0000 mg | ORAL_TABLET | Freq: Two times a day (BID) | ORAL | 0 refills | Status: DC

## 2018-03-20 MED ORDER — CYCLOBENZAPRINE HCL 10 MG PO TABS: 10.0000 mg | ORAL_TABLET | Freq: Three times a day (TID) | ORAL | 0 refills | Status: DC | PRN

## 2018-03-20 MED ORDER — IBUPROFEN 600 MG PO TABS: 600.0000 mg | ORAL_TABLET | Freq: Three times a day (TID) | ORAL | 0 refills | Status: DC | PRN

## 2018-03-20 NOTE — Progress Notes (Signed)
9/27/20191:46 PM  CHAMARI STATUM 14-Jul-1975, 42 y.o. female [id number]  Chief Complaint  Patient presents with  . Chest Pain    X 2 days- pt states some pain in left arm    HPI:   Patient is a 42 y.o. 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 No No No No No     Depression screen [facility] 2/9 03/20/2018 10/17/2017 02/03/2017  Decreased Interest 0 0 0  Down, Depressed, Hopeless 0 0 0  PHQ - 2 Score 0 0 0    Allergies  Allergen Reactions  . Lisinopril Swelling    Prior to Admission medications   Medication Sig Start Date End Date Taking? Authorizing Provider  losartan-hydrochlorothiazide (HYZAAR) 50-12.5 MG tablet Take 1 tablet by mouth daily. 03/09/18   Wallis Bamberg, PA-C  meloxicam (MOBIC) 7.5 MG tablet Take 1 tablet (7.5 mg total) by mouth daily. 03/09/18   Wallis Bamberg, PA-C  ranitidine (ZANTAC) 150 MG tablet TAKE 1 TABLET BY MOUTH TWICE A DAY Patient not taking: Reported on 03/20/2018 04/01/17   Garnetta Buddy, PA    Past Medical History:  Diagnosis Date  . Diabetes mellitus   . Diabetes mellitus without complication (HCC)   . Gall bladder stones   . Hypertension   . PCOS (polycystic ovarian syndrome)     History reviewed. No pertinent surgical history.  Social History   Tobacco Use  . Smoking status: Former Games developer   . Smokeless tobacco: Never Used  Substance Use Topics  . Alcohol use: Yes    History reviewed. No pertinent family history.  ROS Per hpi  OBJECTIVE:  Blood pressure 120/79, pulse 70, temperature 98.2 F (36.8 C), temperature source Oral, resp. rate 20, height 6' (1.829 m), weight 281 lb 3.2 oz (127.6 kg), last menstrual period 02/04/2018, SpO2 97 %. Body mass index is 38.14 kg/m.   Physical Exam  Constitutional: She is oriented to person, place, and time. She appears well-developed and well-nourished.  HENT:  Head: Normocephalic and atraumatic.  Mouth/Throat: Oropharynx is clear and moist. No oropharyngeal exudate.  Eyes: Pupils are equal, round, and reactive to light. Conjunctivae and EOM are normal. No scleral icterus.  Neck: Neck supple.  Cardiovascular: Normal rate, regular rhythm, normal heart sounds and intact distal pulses. Exam reveals no gallop and no friction rub.  No murmur heard. Pulmonary/Chest: Effort normal and breath sounds normal. She has no wheezes. She has no rales. She exhibits no tenderness.  Musculoskeletal: She exhibits no edema.       Left shoulder: Normal.  Neurological: She is alert and oriented to person, place, and time.  Skin: Skin is warm and dry.  Psychiatric: She has a normal mood and affect.  Nursing note and vitals reviewed.  My interpretation of EKG:  NSR, HR 75, no st changes  ASSESSMENT and  PLAN  1. Chest pain, unspecified type Atypical, at rest only, responds to interventions for MSK pain. Will continue to treat accordingly. Patient declines cards referral at this time. ER precautions given - EKG 12-Lead   2. Need for immunization against influenza Flu vaccine given today  Return if symptoms worsen or fail to improve.    Myles Lipps, MD Primary Care at [facility] [address] [place], [place] [address] Ph.  [phone] Fax [phone]

## 2018-03-20 NOTE — Patient Instructions (Signed)
° ° ° °  If you have lab work done today you will be contacted with your lab results within the next 2 weeks.  If you have not heard from us then please contact us. The fastest way to get your results is to register for My Chart. ° ° °IF you received an x-ray today, you will receive an invoice from [facility]. Please contact [facility] at [phone] with questions or concerns regarding your invoice.  ° °IF you received labwork today, you will receive an invoice from [facility]. Please contact [facility] at [phone] with questions or concerns regarding your invoice.  ° °Our billing staff will not be able to assist you with questions regarding bills from these companies. ° °You will be contacted with the lab results as soon as they are available. The fastest way to get your results is to activate your My Chart account. Instructions are located on the last page of this paperwork. If you have not heard from us regarding the results in 2 weeks, please contact this office. °  ° ° ° °

## 2018-04-06 DIAGNOSIS — Z1231 Encounter for screening mammogram for malignant neoplasm of breast: Secondary | ICD-10-CM | POA: Diagnosis not present

## 2018-04-17 ENCOUNTER — Ambulatory Visit: Payer: 59 | Admitting: Urgent Care

## 2018-05-18 ENCOUNTER — Ambulatory Visit: Payer: Self-pay

## 2018-05-18 DIAGNOSIS — R062 Wheezing: Secondary | ICD-10-CM | POA: Diagnosis not present

## 2018-05-18 DIAGNOSIS — J189 Pneumonia, unspecified organism: Secondary | ICD-10-CM | POA: Diagnosis not present

## 2018-05-18 DIAGNOSIS — J209 Acute bronchitis, unspecified: Secondary | ICD-10-CM | POA: Diagnosis not present

## 2018-05-18 NOTE — Telephone Encounter (Signed)
Patient called in with c/o "SOB, cough." She says "the chest congestion started on 05/01/18 with a cough, coughing up thick mucus. Around 1 week after, I started experiencing SOB. The cough has since gotten better, it's non-productive at this time and more at night. The SOB is still present, not at rest, but when I'm up walking, doing stuff, coughing fit or even talking a lot. I'm listening to everyone telling me to go get checked out in case it's pneumonia, so I'm calling to see if I can come in to see someone." I asked about other symptoms, she says "no fever, no chest pain, but a little nasal congestion which is not normal for me this time of year." I asked about travel, she says "I did to to [place] and stayed on a resort." According to protocol, see PCP within 2 weeks. Patient has no PCP at [facility] but requested to see Dr. Pamella Pert. No availability with Dr. Pamella Pert. Patient says she will see any provider. No availability. I called and spoke to [place], [facility] to ask if a slot could be opened up, she says the protocol set is for the patient to go to an UC and to follow up with [facility] if that is the recommendation at the UC. I advised the patient of the above from [place]. She says she will call her insurance and see what UC is in the network. I asked did she want me to schedule an establish care with Dr. Pamella Pert, she says she will hold off and make a decision about that.   Reason for Disposition . [1] MILD longstanding difficulty breathing AND [2]  SAME as normal  Answer Assessment - Initial Assessment Questions 1. RESPIRATORY STATUS: "Describe your breathing?" (e.g., wheezing, shortness of breath, unable to speak, severe coughing)      Shortness of breath-winded really quick, talking or walking 2. ONSET: "When did this breathing problem begin?"      1 week after the cough started on 05/01/18 3. PATTERN "Does the difficult breathing come and go, or has it been constant since it started?"      Constant x 2  weeks 4. SEVERITY: "How bad is your breathing?" (e.g., mild, moderate, severe)    - MILD: No SOB at rest, mild SOB with walking, speaks normally in sentences, can lay down, no retractions, pulse < 100.    - MODERATE: SOB at rest, SOB with minimal exertion and prefers to sit, cannot lie down flat, speaks in phrases, mild retractions, audible wheezing, pulse 100-120.    - SEVERE: Very SOB at rest, speaks in single words, struggling to breathe, sitting hunched forward, retractions, pulse > 120      Mild 5. RECURRENT SYMPTOM: "Have you had difficulty breathing before?" If so, ask: "When was the last time?" and "What happened that time?"      No 6. CARDIAC HISTORY: "Do you have any history of heart disease?" (e.g., heart attack, angina, bypass surgery, angioplasty)      No 7. LUNG HISTORY: "Do you have any history of lung disease?"  (e.g., pulmonary embolus, asthma, emphysema)     No 8. CAUSE: "What do you think is causing the breathing problem?"      I figure it's left over from the chest cold since 05/01/18, I'm concerned about pneumonia 9. OTHER SYMPTOMS: "Do you have any other symptoms? (e.g., dizziness, runny nose, cough, chest pain, fever)     Cough-non-productive, a little nasal congestion 10. PREGNANCY: "Is there any chance you are  pregnant?" "When was your last menstrual period?"       No; LMP last month on the 14th 11. TRAVEL: "Have you traveled out of the country in the last month?" (e.g., travel history, exposures)       Yes, went to [place] to a resort  Protocols used: BREATHING DIFFICULTY-A-AH

## 2018-05-19 DIAGNOSIS — Z6841 Body Mass Index (BMI) 40.0 and over, adult: Secondary | ICD-10-CM | POA: Diagnosis not present

## 2018-05-19 DIAGNOSIS — Z01419 Encounter for gynecological examination (general) (routine) without abnormal findings: Secondary | ICD-10-CM | POA: Diagnosis not present

## 2018-06-04 ENCOUNTER — Encounter: Payer: Self-pay | Admitting: Physician Assistant

## 2018-06-04 ENCOUNTER — Other Ambulatory Visit: Payer: Self-pay

## 2018-06-04 ENCOUNTER — Ambulatory Visit: Payer: 59 | Admitting: Physician Assistant

## 2018-06-04 VITALS — BP 129/80 | HR 91 | Temp 97.6°F | Resp 16 | Ht 71.0 in | Wt 296.0 lb

## 2018-06-04 DIAGNOSIS — H1132 Conjunctival hemorrhage, left eye: Secondary | ICD-10-CM | POA: Diagnosis not present

## 2018-06-04 NOTE — Progress Notes (Signed)
 Doris Armstrong  MRN: [id number] DOB: 10-May-1976  PCP: Patient, No Pcp Per  Subjective:  Pt is a 42 year old female who presents to clinic for eye problem.   Endorses itchy and water right eye 2-3 days ago. Eye has developed progressive redness starting from the inner side of the white part and now has progressed to the outside of the white part.  Denies FB sensation, vision changes, pain, discharge, swelling, fever, chills, cough, sinus pressure Of note, she was treated for pneumonia a few weeks ago and had a lingering cough   Review of Systems  Constitutional: Negative for chills and fever.  HENT: Negative for congestion, sinus pressure, sinus pain and sneezing.   Eyes: Positive for redness and itching. Negative for photophobia, pain, discharge and visual disturbance.  Respiratory: Negative for cough and shortness of breath.   Neurological: Negative for dizziness and headaches.    Patient Active Problem List   Diagnosis Date Noted  . Essential hypertension 10/06/2015  . Hyperglycemia 10/06/2015  . Elevated LFTs 07/21/2014    Current Outpatient Medications on File Prior to Visit  Medication Sig Dispense Refill  . losartan-hydrochlorothiazide (HYZAAR) 50-12.5 MG tablet Take 1 tablet by mouth daily. 90 tablet 3  . cyclobenzaprine (FLEXERIL) 10 MG tablet Take 1 tablet (10 mg total) by mouth 3 (three) times daily as needed for muscle spasms. (Patient not taking: Reported on 06/04/2018) 30 tablet 0  . ibuprofen (ADVIL,MOTRIN) 600 MG tablet Take 1 tablet (600 mg total) by mouth every 8 (eight) hours as needed. (Patient not taking: Reported on 06/04/2018) 30 tablet 0  . meloxicam (MOBIC) 7.5 MG tablet Take 1 tablet (7.5 mg total) by mouth daily. (Patient not taking: Reported on 06/04/2018) 30 tablet 0  . ranitidine (ZANTAC) 150 MG tablet TAKE 1 TABLET BY MOUTH TWICE A DAY (Patient not taking: Reported on 03/20/2018) 60 tablet 0   No current facility-administered medications on file prior  to visit.     Allergies  Allergen Reactions  . Lisinopril Swelling     Objective:  BP 129/80 (BP Location: Right Arm, Patient Position: Sitting, Cuff Size: Large)   Pulse 91   Temp 97.6 F (36.4 C) (Oral)   Resp 16   Ht 5\' 11"  (1.803 m)   Wt 296 lb (134.3 kg)   LMP 05/25/2018   SpO2 98%   BMI 41.28 kg/m   Physical Exam Vitals signs reviewed.  Constitutional:      Appearance: Normal appearance.  Eyes:     General:        Right eye: No foreign body or discharge.        Left eye: No foreign body or discharge.     Extraocular Movements: Extraocular movements intact.     Conjunctiva/sclera:     Right eye: Right conjunctiva is not injected. No exudate or hemorrhage.    Left eye: Left conjunctiva is not injected. Hemorrhage present. No exudate.     Comments: Negative fundoscopic exam.   Neurological:     Mental Status: She is alert.  Psychiatric:        Mood and Affect: Mood normal.        Behavior: Behavior normal.    Assessment and Plan :  1. Subconjunctival hemorrhage of left eye - pt endorses red conjunctiva s/p PNU. Suspect hemorrhage 2/2 elevated venous pressure from coughing. No red flags, negative fundoscopic exam. I do not feel referral to ophthalmology is needed at this time. Advised watchful waiting x 2  weeks. RTC if no improvement or sooner if symptoms worsen.   Marco Collie, PA-C  Primary Care at [facility] 06/04/2018 2:51 PM  Please note: Portions of this report may have been transcribed using dragon voice recognition software. Every effort was made to ensure accuracy; however, inadvertent computerized transcription errors may be present.

## 2018-06-19 DIAGNOSIS — N72 Inflammatory disease of cervix uteri: Secondary | ICD-10-CM | POA: Diagnosis not present

## 2018-06-19 DIAGNOSIS — R87612 Low grade squamous intraepithelial lesion on cytologic smear of cervix (LGSIL): Secondary | ICD-10-CM | POA: Diagnosis not present

## 2018-09-19 ENCOUNTER — Telehealth: Payer: Self-pay | Admitting: Family Medicine

## 2018-09-19 NOTE — Telephone Encounter (Signed)
Pt. Asserts she had been to a

## 2018-09-21 ENCOUNTER — Encounter: Payer: Self-pay | Admitting: Family Medicine

## 2018-09-21 ENCOUNTER — Other Ambulatory Visit: Payer: Self-pay

## 2018-09-21 ENCOUNTER — Telehealth (INDEPENDENT_AMBULATORY_CARE_PROVIDER_SITE_OTHER): Payer: 59 | Admitting: Family Medicine

## 2018-09-21 DIAGNOSIS — J181 Lobar pneumonia, unspecified organism: Secondary | ICD-10-CM

## 2018-09-21 DIAGNOSIS — I10 Essential (primary) hypertension: Secondary | ICD-10-CM | POA: Diagnosis not present

## 2018-09-21 DIAGNOSIS — R635 Abnormal weight gain: Secondary | ICD-10-CM

## 2018-09-21 DIAGNOSIS — R739 Hyperglycemia, unspecified: Secondary | ICD-10-CM

## 2018-09-21 DIAGNOSIS — R7303 Prediabetes: Secondary | ICD-10-CM

## 2018-09-21 DIAGNOSIS — J189 Pneumonia, unspecified organism: Secondary | ICD-10-CM

## 2018-09-21 MED ORDER — CARVEDILOL 6.25 MG PO TABS: 6.2500 mg | ORAL_TABLET | Freq: Two times a day (BID) | ORAL | 6 refills | Status: DC

## 2018-09-21 NOTE — Progress Notes (Signed)
CC: Health questions-per pt she was seen at [facility] and dx with pneumonia in right lung.  Per pt dx with pneumonia in left lund in Nov. 2019 and is concerned with Covid-19 going around.  Per pt thinks she will need note to cover her to be out of work since she has pneumonia and not safe to be out during the pandemic and never got the webex link.  No travel out of country or [place], no falls and no depression.

## 2018-09-21 NOTE — Progress Notes (Signed)
Telemedicine Encounter- SOAP NOTE Established Patient  This telephone encounter was conducted with the patient's (or proxy's) verbal consent via audio telecommunications: yes/no: Yes Patient was instructed to have this encounter in a suitably private space; and to only have persons present to whom they give permission to participate. In addition, patient identity was confirmed by use of name plus two identifiers (DOB and address).  I discussed the limitations, risks, security and privacy concerns of performing an evaluation and management service by telephone and the availability of in person appointments. I also discussed with the patient that there may be a patient responsible charge related to this service. The patient expressed understanding and agreed to proceed.  I spent a total of TIME; 0 MIN TO 60 MIN: 25 minutes talking with the patient or their proxy.  CC: PNEUMONIA  Subjective   Doris Armstrong is a 43 y.o. established patient. Telephone visit today for  HPI   Patient reports that she has some shortness of breath and fatigue She states that with coughing she gets winded She denies fevers or chills She has no history of fevers or chills She went to [facility] on 09/17/2018 and was diagnosed with Pneumonia of the right lung. She was given proair hfa and levaquin '750mg'$ .  She was given prednisone which she is not taking.   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 Essential hypertension 10/06/2015  . Hyperglycemia 10/06/2015  . Elevated LFTs 07/21/2014    Past Medical History:  Diagnosis Date  . Diabetes mellitus   . Diabetes mellitus without complication ([place])   . Gall bladder stones   . Hypertension   . PCOS (polycystic ovarian syndrome)     Current Outpatient Medications  Medication Sig Dispense Refill  . losartan-hydrochlorothiazide (HYZAAR) 50-12.5 MG tablet Take 1 tablet by mouth daily. 90 tablet 3  . carvedilol (COREG) 6.25 MG tablet Take 1 tablet (6.25 mg total) by mouth 2 (two) times daily with a meal. 60 tablet 6   No current facility-administered medications for this visit.     Allergies  Allergen Reactions  . Lisinopril Swelling    Social History   Socioeconomic History  . Marital status: Single    Spouse name: Not on file  . Number of children: 0  . Years of education: Not on file  . Highest education level: Not on file  Occupational History  . Not on file  Social Needs  . Financial resource strain: Not on file  . Food insecurity:    Worry: Not on file    Inability: Not on file  . Transportation needs:    Medical: Not on file    Non-medical: Not on file  Tobacco Use  . Smoking status: Former Research scientist (life sciences)  . Smokeless tobacco: Never Used  Substance and Sexual Activity  . Alcohol  use: Yes  . Drug use: No  . Sexual activity: Yes  Lifestyle  . Physical activity:    Days per week: Not on file    Minutes per session: Not on file  . Stress: Not on file  Relationships  . Social connections:    Talks on phone: Not on file    Gets together: Not on file    Attends religious service: Not on file    Active member of club or organization: Not on file    Attends meetings of clubs or organizations: Not on file    Relationship status: Not on file  . Intimate partner violence:    Fear of current or ex partner: Not on file    Emotionally abused: Not on file    Physically abused: Not on  file    Forced sexual activity: Not on file  Other Topics Concern  . Not on file  Social History Narrative   ** Merged History Encounter **        ROS Review of Systems  Constitutional: Negative for activity change, appetite change, chills and fever.  HENT: Negative for congestion, nosebleeds, trouble swallowing and voice change.   Respiratory: see hpi  Gastrointestinal: Negative for diarrhea, nausea and vomiting.  Genitourinary: Negative for difficulty urinating, dysuria, flank pain and hematuria.  Musculoskeletal: Negative for back pain, joint swelling and neck pain.  Neurological: Negative for dizziness, speech difficulty, light-headedness and numbness.  See HPI. All other review of systems negative.   Objective   Vitals as reported by the patient: There were no vitals filed for this visit.  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     I discussed the assessment and treatment plan with the patient. The patient was provided an opportunity to ask questions and all were answered. The patient agreed with the plan and demonstrated an understanding of the  instructions.   The patient was advised to call back or seek an in-person evaluation if the symptoms worsen or if the condition fails to improve as anticipated.  I provided 25 minutes of non-face-to-face time during this encounter.  Forrest Moron, MD  Primary Care at [facility]

## 2018-10-20 ENCOUNTER — Telehealth: Payer: Self-pay | Admitting: Family Medicine

## 2018-10-20 NOTE — Telephone Encounter (Signed)
Copied from [place] [phone]. Topic: Quick Communication - See Telephone Encounter >> Oct 20, 2018  9:42 AM Vernona Rieger wrote: CRM for notification. See Telephone encounter for: 10/20/18. Patient states she checked her blood sugar last night and it was 303, checked it at bedtime and it was 215. She checked it this morning and it was 280. She said she used to be on metformin and would like to start back on it. She had some left over at home, she took one last night and that is was brought it down. Please contact patient

## 2018-10-22 NOTE — Telephone Encounter (Signed)
Pt needs tele med apt for blood sugar.

## 2018-10-26 ENCOUNTER — Other Ambulatory Visit: Payer: Self-pay

## 2018-10-26 ENCOUNTER — Telehealth (INDEPENDENT_AMBULATORY_CARE_PROVIDER_SITE_OTHER): Payer: 59 | Admitting: Family Medicine

## 2018-10-26 ENCOUNTER — Encounter: Payer: Self-pay | Admitting: Family Medicine

## 2018-10-26 DIAGNOSIS — J189 Pneumonia, unspecified organism: Secondary | ICD-10-CM

## 2018-10-26 DIAGNOSIS — E1165 Type 2 diabetes mellitus with hyperglycemia: Secondary | ICD-10-CM

## 2018-10-26 DIAGNOSIS — J181 Lobar pneumonia, unspecified organism: Secondary | ICD-10-CM | POA: Diagnosis not present

## 2018-10-26 DIAGNOSIS — I10 Essential (primary) hypertension: Secondary | ICD-10-CM

## 2018-10-26 MED ORDER — METFORMIN HCL 500 MG PO TABS: ORAL_TABLET | ORAL | 3 refills | Status: DC

## 2018-10-26 NOTE — Patient Instructions (Addendum)
 Low Glycemic Foods (20-49)  Breakfast Cereals: All-Bran                All-Bran Fruit ' n Oats Fiber One               Oatmeal (not instant)  Oat bran Fruits and fruit juices: (Limit to 1-2 servings per day) Apples               Apricots (fresh & dried)  Blackberries            Blueberries Cherries                  Cranberries             Peaches                  Pears                       Plums                       Prunes Grapefruit                Raspberries            Strawberries           Tangerine Apple juice             Grapefruit juice Tomato juice  Beans and legumes (fresh-cooked): Black-eyed peas     Butter beans Chick peas              Lentils     Green beans           Lima beans               Kidney beans          Navy beans  Non-starchy vegetables: Asparagus, bok choy, broccoli, cabbage, cauliflower, celery, cucumber, greens, lettuce, mushrooms, peppers, tomatoes, okra, onions, snow peas, spinach, summer squash  Grains: Barley                                Bulgur Rye                                    Wild rice  Nuts and oils : Almonds         Peanuts     Sunflower seeds  Hazelnuts      Pecans          Walnuts Oils that are liquid at room temperature  Dairy, fish, and meat: Milk, skim                         Lowfat cheese Yogurt, lowfat, fruit sugar sweetened Lean red meat                      Fish  Skinless chicken & turkey    Shellfish Moderate Glycemic Foods (50-69)  Breakfast Cereals: Bran Buds                             Bran Chex Just Right                              Mini-Wheats Special K         Swiss muesli  Fruits: Banana (under-ripe)             Dates Figs                                      Grapes Kiwi                                      Mango Oranges                               Raisins  Fruit Juices: Cranberry juice                    Orange juice  Beans and legumes: [place]-type baked beans Canned pinto, kidney,  or navy beans Green peas  Vegetables: Beets                         Raw Carrots  Sweet potato              Yam Corn on the cob  Breads: Pita (pocket) bread          Oat bran bread Pumpernickel bread           Rye bread Wheat bread, high fiber       Grains: Cornmeal                           Rice, brown   Rice, white                         Couscous Pasta: Macaroni                           Pizza  cheese Raviolimeat filled           Spaghetti, white        Nuts: Cashews                           Macadamia  Snacks: Chocolate                    Ice cream,lowfat  Muffin                               Popcorn High Glycemic Foods (70-100)   Breakfast Cereals: Cheerios                 Corn Chex Corn Flakes            Cream of Wheat Grape Nuts              Grape Nut Flakes Life                 Nutri-Grain       Puffed Rice               Puffed Wheat Rice Chex                 Rice Krispies Shredded Wheat               Team Total Fruits: Pineapple                 Watermelon Banana (over-ripe)  Beverages: Sodas, sweet tea, pineapple juice  Vegetables: Potato, baked, boiled, fried, mashed French fries Canned or frozen corn Cooked carrots Parsnips Winter squash  Breads: Most breads (white and whole grain) Bagels                     Bread sticks Bread stuffing          Kaiser roll Dinner rolls  Grains: Rice, instant          Tapioca, with milk  Candy and most cookies Snacks: Donuts                      Corn chips        Jelly beans                 Pretzels Pastries                             Restaurant and ethnic foods Most Chinese food (sugar in stir fry or wok sauces) Teriyaki-style meats and vegetables     If you have lab work done today you will be contacted with your lab results within the next 2 weeks.  If you have not heard from us then please contact us. The fastest way to get your results is to register for My Chart.   IF you received an  x-ray today, you will receive an invoice from [facility]. Please contact [facility] at [phone] with questions or concerns regarding your invoice.   IF you received labwork today, you will receive an invoice from [facility]. Please contact [facility] at [phone] with questions or concerns regarding your invoice.   Our billing staff will not be able to assist you with questions regarding bills from these companies.  You will be contacted with the lab results as soon as they are available. The fastest way to get your results is to activate your My Chart account. Instructions are located on the last page of this paperwork. If you have not heard from us regarding the results in 2 weeks, please contact this office.     

## 2018-10-26 NOTE — Progress Notes (Signed)
CC: Discuss getting back on metformin 500mg .  Per pt has restarted the metformin 500 mg x1 week three times daily.  Per pt wants to discuss some issues she is having with the carvedilol.  No travel outside the [place] or [place] in the past 3 weeks.

## 2018-10-26 NOTE — Progress Notes (Signed)
Telemedicine Encounter- SOAP NOTE Established Patient  This telephone encounter was conducted with the patient's (or proxy's) verbal consent via audio telecommunications: yes/no: Yes Patient was instructed to have this encounter in a suitably private space; and to only have persons present to whom they give permission to participate. In addition, patient identity was confirmed by use of name plus two identifiers (DOB and address).  I discussed the limitations, risks, security and privacy concerns of performing an evaluation and management service by telephone and the availability of in person appointments. I also discussed with the patient that there may be a patient responsible charge related to this service. The patient expressed understanding and agreed to proceed.  I spent a total of TIME; 0 MIN TO 60 MIN: 25 minutes talking with the patient or their proxy.  No chief complaint on file.   Subjective   Doris Armstrong is a 43 y.o. established patient. Telephone visit today for  HPI  Diabetes Mellitus Type 2 She was previously eating once a day and drinking alcohol She was getting covid testing and asked for a diabetes check Her a1c was 8.3% She started taking her metformin again  Takes metformin 500mg  with lunch, 1000mg  with dinner Lab Results  Component Value Date   HGBA1C 5.7 10/01/2016   Essential Hypertension She states that she was taking losartan and noticed some rawness along the gum She denies chest pain and palpitations She is consuming a low sodium diet  Community Acquired Pneumonia She reports that her evaluation for pneumonia was completed with recent covid testing which was negative She is no longer coughing or having sob She has returned to work    Patient Active Problem List   Diagnosis Date Noted  . Essential hypertension 10/06/2015  . Hyperglycemia 10/06/2015  . Elevated LFTs 07/21/2014    Past Medical History:  Diagnosis Date  . Diabetes  mellitus   . Diabetes mellitus without complication ([place])   . Gall bladder stones   . Hypertension   . PCOS (polycystic ovarian syndrome)     Current Outpatient Medications  Medication Sig Dispense Refill  . losartan-hydrochlorothiazide (HYZAAR) 50-12.5 MG tablet Take 1 tablet by mouth daily. 90 tablet 3  . carvedilol (COREG) 6.25 MG tablet Take 1 tablet (6.25 mg total) by mouth 2 (two) times daily with a meal. (Patient not taking: Reported on 10/26/2018) 60 tablet 6  . metFORMIN (GLUCOPHAGE) 500 MG tablet Take one tablet with lunch and 2 tablets with dinner 180 tablet 3   No current facility-administered medications for this visit.     Allergies  Allergen Reactions  . Lisinopril Swelling    Social History   Socioeconomic History  . Marital status: Single    Spouse name: Not on file  . Number of children: 0  . Years of education: Not on file  . Highest education level: Not on file  Occupational History  . Not on file  Social Needs  . Financial resource strain: Not on file  . Food insecurity:    Worry: Not on file    Inability: Not on file  . Transportation needs:    Medical: Not on file    Non-medical: Not on file  Tobacco Use  . Smoking status: Former Research scientist (life sciences)  . Smokeless tobacco: Never Used  Substance and Sexual Activity  . Alcohol use: Yes  . Drug use: No  . Sexual activity: Yes  Lifestyle  . Physical activity:    Days per week: Not on file  Minutes per session: Not on file  . Stress: Not on file  Relationships  . Social connections:    Talks on phone: Not on file    Gets together: Not on file    Attends religious service: Not on file    Active member of club or organization: Not on file    Attends meetings of clubs or organizations: Not on file    Relationship status: Not on file  . Intimate partner violence:    Fear of current or ex partner: Not on file    Emotionally abused: Not on file    Physically abused: Not on file    Forced sexual activity:  Not on file  Other Topics Concern  . Not on file  Social History Narrative   ** Merged History Encounter **        ROS Review of Systems  Constitutional: Negative for activity change, appetite change, chills and fever.  HENT: Negative for congestion, nosebleeds, trouble swallowing and voice change.   Respiratory: Negative for cough, shortness of breath and wheezing.   Gastrointestinal: Negative for diarrhea, nausea and vomiting.  Genitourinary: Negative for difficulty urinating, dysuria, flank pain and hematuria.  Musculoskeletal: Negative for back pain, joint swelling and neck pain.  Neurological: Negative for dizziness, speech difficulty, light-headedness and numbness.  See HPI. All other review of systems negative.   Objective   Vitals as reported by the patient: There were no vitals filed for this visit.  Diagnoses and all orders for this visit:  Essential hypertension- discussed that she should remain off losartan until evaluation in the office Uncertain if this was a reaction  Community acquired pneumonia of right lower lobe of lung ([place])- resolved, covid neg  Type 2 diabetes mellitus with hyperglycemia, without long-term current use of insulin ([place])- new diagnosis  Next visit will be for a1c recheck and diabetic education  Other orders -     metFORMIN (GLUCOPHAGE) 500 MG tablet; Take one tablet with lunch and 2 tablets with dinner     I discussed the assessment and treatment plan with the patient. The patient was provided an opportunity to ask questions and all were answered. The patient agreed with the plan and demonstrated an understanding of the instructions.   The patient was advised to call back or seek an in-person evaluation if the symptoms worsen or if the condition fails to improve as anticipated.  I provided 25 minutes of non-face-to-face time during this encounter.  Forrest Moron, MD  Primary Care at [facility]

## 2018-11-10 ENCOUNTER — Telehealth (INDEPENDENT_AMBULATORY_CARE_PROVIDER_SITE_OTHER): Payer: 59 | Admitting: Family Medicine

## 2018-11-10 ENCOUNTER — Other Ambulatory Visit: Payer: Self-pay

## 2018-11-10 DIAGNOSIS — E1165 Type 2 diabetes mellitus with hyperglycemia: Secondary | ICD-10-CM

## 2018-11-10 NOTE — Patient Instructions (Signed)
° ° ° °  If you have lab work done today you will be contacted with your lab results within the next 2 weeks.  If you have not heard from us then please contact us. The fastest way to get your results is to register for My Chart. ° ° °IF you received an x-ray today, you will receive an invoice from [facility]. Please contact [facility] at [phone] with questions or concerns regarding your invoice.  ° °IF you received labwork today, you will receive an invoice from [facility]. Please contact [facility] at [phone] with questions or concerns regarding your invoice.  ° °Our billing staff will not be able to assist you with questions regarding bills from these companies. ° °You will be contacted with the lab results as soon as they are available. The fastest way to get your results is to activate your My Chart account. Instructions are located on the last page of this paperwork. If you have not heard from us regarding the results in 2 weeks, please contact this office. °  ° ° ° °

## 2018-11-10 NOTE — Progress Notes (Signed)
CC: f/u diabetes.  No concerns, no refills needed.  No recent weight or bp taken.  Not travel outside the [place] or [place] in the past 3 weeks.

## 2018-11-10 NOTE — Progress Notes (Signed)
Telemedicine Encounter- SOAP NOTE Established Patient  This telephone encounter was conducted with the patient's (or proxy's) verbal consent via audio telecommunications: yes/no: yes Patient was instructed to have this encounter in a suitably private space; and to only have persons present to whom they give permission to participate. In addition, patient identity was confirmed by use of name plus two identifiers (DOB and address).  I discussed the limitations, risks, security and privacy concerns of performing an evaluation and management service by telephone and the availability of in person appointments. I also discussed with the patient that there may be a patient responsible charge related to this service. The patient expressed understanding and agreed to proceed.  I spent a total of TIME; 0 MIN TO 60 MIN: 15 minutes talking with the patient or their proxy.  CC: diabetes  Subjective   Doris Armstrong is a 43 y.o. established patient. Telephone visit today for  HPI  Diabetes Mellitus: Patient presents for follow up of diabetes. Symptoms: none. Symptoms have stabilized. Patient denies foot ulcerations, hyperglycemia, hypoglycemia , increase appetite, nausea, polydipsia and polyuria.  Evaluation to date has been included: hemoglobin A1C.  Home sugars: BGs consistently in an acceptable range. Treatment to date: Continued metformin which has been effective. 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 She has cut down on sugar and carb and she is only doing vegetables and proteins. She states that she does not feel like she is losing weight. Lab Results  Component Value Date   HGBA1C 5.7 10/01/2016     Patient Active Problem List   Diagnosis Date Noted  . Essential hypertension 10/06/2015  . Hyperglycemia 10/06/2015  . Elevated LFTs 07/21/2014    Past Medical History:  Diagnosis Date  . Diabetes mellitus   . Diabetes mellitus without complication ([place])   .  Gall bladder stones   . Hypertension   . PCOS (polycystic ovarian syndrome)     Current Outpatient Medications  Medication Sig Dispense Refill  . losartan-hydrochlorothiazide (HYZAAR) 50-12.5 MG tablet Take 1 tablet by mouth daily. 90 tablet 3  . metFORMIN (GLUCOPHAGE) 500 MG tablet Take one tablet with lunch and 2 tablets with dinner 180 tablet 3  . carvedilol (COREG) 6.25 MG tablet Take 1 tablet (6.25 mg total) by mouth 2 (two) times daily with a meal. (Patient not taking: Reported on 10/26/2018) 60 tablet 6   No current facility-administered medications for this visit.     Allergies  Allergen Reactions  . Lisinopril Swelling    Social History   Socioeconomic History  . Marital status: Single    Spouse name: Not on file  . Number of children: 0  . Years of education: Not on file  . Highest education level: Not on file  Occupational History  . Not on file  Social Needs  . Financial resource strain: Not on file  . Food insecurity:    Worry: Not on file    Inability: Not on file  . Transportation needs:    Medical: Not on file    Non-medical: Not on file  Tobacco Use  . Smoking status: Former Research scientist (life sciences)  . Smokeless tobacco: Never Used  Substance and Sexual Activity  . Alcohol use: Yes  . Drug use: No  . Sexual activity: Yes  Lifestyle  . Physical activity:    Days per week: Not on file    Minutes per session: Not on file  . Stress: Not on file  Relationships  . Social connections:    Talks on phone: Not on file    Gets together: Not on file    Attends religious service: Not on file    Active member of club or organization: Not on file    Attends meetings of clubs or organizations: Not on file    Relationship status: Not on file  . Intimate partner violence:    Fear of current or ex partner: Not on file    Emotionally abused: Not on file    Physically abused: Not on file    Forced sexual activity: Not on file  Other Topics Concern  . Not on file  Social  History Narrative   ** Merged History Encounter **        ROS See hpi Review of Systems  Constitutional: Negative for activity change, appetite change, chills and fever.  HENT: Negative for congestion, nosebleeds, trouble swallowing and voice change.   Respiratory: Negative for cough, shortness of breath and wheezing.   Gastrointestinal: Negative for diarrhea, nausea and vomiting.  Genitourinary: Negative for difficulty urinating, dysuria, flank pain and hematuria.  Musculoskeletal: Negative for back pain, joint swelling and neck pain.  Neurological: Negative for dizziness, speech difficulty, light-headedness and numbness.  See HPI. All other review of systems negative.   Objective   Vitals as reported by the patient: There were no vitals filed for this visit.  Diagnoses and all orders for this visit:  Type 2 diabetes mellitus with hyperglycemia, without long-term current use of insulin ([place])   Future labs already ordered Discussed that patient should present for labs in the next month as the metformin typically requires monitoring of kidney and liver function As well as repeating a1c   I discussed the assessment and treatment plan with the patient. The patient was provided an opportunity to ask questions and all were answered. The patient agreed with the plan and demonstrated an understanding of the instructions.   The patient was advised to call back or seek an in-person evaluation if the symptoms worsen or if the condition fails to improve as anticipated.  I provided 15 minutes of non-face-to-face time during this encounter.  Forrest Moron, MD  Primary Care at [facility]

## 2018-11-19 NOTE — Progress Notes (Signed)
LVM FOR PATIENT TO CALL AND SCHEDULE LAB WORK

## 2018-11-30 ENCOUNTER — Other Ambulatory Visit: Payer: Self-pay

## 2018-11-30 ENCOUNTER — Ambulatory Visit (INDEPENDENT_AMBULATORY_CARE_PROVIDER_SITE_OTHER): Payer: 59 | Admitting: Family Medicine

## 2018-11-30 DIAGNOSIS — I10 Essential (primary) hypertension: Secondary | ICD-10-CM

## 2018-11-30 DIAGNOSIS — R739 Hyperglycemia, unspecified: Secondary | ICD-10-CM

## 2018-11-30 DIAGNOSIS — R635 Abnormal weight gain: Secondary | ICD-10-CM

## 2018-11-30 LAB — LIPID PANEL

## 2018-12-01 LAB — CMP14+EGFR
ALT: 34 IU/L — ABNORMAL HIGH (ref 0–32)
AST: 19 IU/L (ref 0–40)
Albumin/Globulin Ratio: 1.7 (ref 1.2–2.2)
Albumin: 4.3 g/dL (ref 3.8–4.8)
Alkaline Phosphatase: 56 IU/L (ref 39–117)
BUN/Creatinine Ratio: 12 (ref 9–23)
BUN: 11 mg/dL (ref 6–24)
Bilirubin Total: 0.3 mg/dL (ref 0.0–1.2)
CO2: 21 mmol/L (ref 20–29)
Calcium: 9.6 mg/dL (ref 8.7–10.2)
Chloride: 105 mmol/L (ref 96–106)
Creatinine, Ser: 0.91 mg/dL (ref 0.57–1.00)
GFR calc Af Amer: 89 mL/min/{1.73_m2} (ref >59)
GFR calc non Af Amer: 78 mL/min/{1.73_m2} (ref >59)
Globulin, Total: 2.5 g/dL (ref 1.5–4.5)
Glucose: 128 mg/dL — ABNORMAL HIGH (ref 65–99)
Potassium: 4.9 mmol/L (ref 3.5–5.2)
Sodium: 140 mmol/L (ref 134–144)
Total Protein: 6.8 g/dL (ref 6.0–8.5)

## 2018-12-01 LAB — LIPID PANEL
Chol/HDL Ratio: 2.6 ratio (ref 0.0–4.4)
Cholesterol, Total: 130 mg/dL (ref 100–199)
HDL: 50 mg/dL (ref >39)
LDL Calculated: 70 mg/dL (ref 0–99)
Triglycerides: 52 mg/dL (ref 0–149)
VLDL Cholesterol Cal: 10 mg/dL (ref 5–40)

## 2018-12-01 LAB — HEMOGLOBIN A1C
Est. average glucose Bld gHb Est-mCnc: 200 mg/dL
Hgb A1c MFr Bld: 8.6 % — ABNORMAL HIGH (ref 4.8–5.6)

## 2018-12-01 LAB — MICROALBUMIN, URINE: Microalbumin, Urine: 6.3 ug/mL

## 2018-12-01 LAB — TSH: TSH: 2.74 u[IU]/mL (ref 0.450–4.500)

## 2018-12-04 ENCOUNTER — Encounter (HOSPITAL_COMMUNITY): Payer: Self-pay | Admitting: Emergency Medicine

## 2018-12-04 ENCOUNTER — Other Ambulatory Visit (HOSPITAL_BASED_OUTPATIENT_CLINIC_OR_DEPARTMENT_OTHER): Payer: Self-pay | Admitting: Emergency Medicine

## 2018-12-04 ENCOUNTER — Other Ambulatory Visit: Payer: Self-pay

## 2018-12-04 ENCOUNTER — Observation Stay (HOSPITAL_COMMUNITY)
Admission: EM | Admit: 2018-12-04 | Discharge: 2018-12-06 | DRG: 418 | Disposition: A | Discharge status: Home / Self Care | Payer: 59 | Attending: Surgery | Admitting: Surgery

## 2018-12-04 ENCOUNTER — Ambulatory Visit (HOSPITAL_BASED_OUTPATIENT_CLINIC_OR_DEPARTMENT_OTHER)
Admission: RE | Admit: 2018-12-04 | Discharge: 2018-12-04 | Disposition: A | Discharge status: Home / Self Care | Payer: 59 | Source: Ambulatory Visit | Attending: Emergency Medicine | Admitting: Emergency Medicine

## 2018-12-04 DIAGNOSIS — Z79899 Other long term (current) drug therapy: Secondary | ICD-10-CM | POA: Insufficient documentation

## 2018-12-04 DIAGNOSIS — E119 Type 2 diabetes mellitus without complications: Secondary | ICD-10-CM | POA: Insufficient documentation

## 2018-12-04 DIAGNOSIS — K8020 Calculus of gallbladder without cholecystitis without obstruction: Secondary | ICD-10-CM | POA: Diagnosis present

## 2018-12-04 DIAGNOSIS — Z6841 Body Mass Index (BMI) 40.0 and over, adult: Secondary | ICD-10-CM | POA: Insufficient documentation

## 2018-12-04 DIAGNOSIS — R1011 Right upper quadrant pain: Secondary | ICD-10-CM

## 2018-12-04 DIAGNOSIS — Z87891 Personal history of nicotine dependence: Secondary | ICD-10-CM | POA: Insufficient documentation

## 2018-12-04 DIAGNOSIS — K8012 Calculus of gallbladder with acute and chronic cholecystitis without obstruction: Principal | ICD-10-CM | POA: Insufficient documentation

## 2018-12-04 DIAGNOSIS — Z1159 Encounter for screening for other viral diseases: Secondary | ICD-10-CM | POA: Insufficient documentation

## 2018-12-04 DIAGNOSIS — Z7984 Long term (current) use of oral hypoglycemic drugs: Secondary | ICD-10-CM | POA: Insufficient documentation

## 2018-12-04 DIAGNOSIS — I10 Essential (primary) hypertension: Secondary | ICD-10-CM | POA: Diagnosis not present

## 2018-12-04 DIAGNOSIS — K810 Acute cholecystitis: Secondary | ICD-10-CM | POA: Diagnosis present

## 2018-12-04 LAB — COMPREHENSIVE METABOLIC PANEL
ALT: 42 U/L (ref 0–44)
AST: 26 U/L (ref 15–41)
Albumin: 4.3 g/dL (ref 3.5–5.0)
Alkaline Phosphatase: 49 U/L (ref 38–126)
Anion gap: 7 (ref 5–15)
BUN: 16 mg/dL (ref 6–20)
CO2: 27 mmol/L (ref 22–32)
Calcium: 9.2 mg/dL (ref 8.9–10.3)
Chloride: 108 mmol/L (ref 98–111)
Creatinine, Ser: 0.85 mg/dL (ref 0.44–1.00)
GFR calc Af Amer: >60 mL/min (ref >60)
GFR calc non Af Amer: >60 mL/min (ref >60)
Glucose, Bld: 112 mg/dL — ABNORMAL HIGH (ref 70–99)
Potassium: 4.1 mmol/L (ref 3.5–5.1)
Sodium: 142 mmol/L (ref 135–145)
Total Bilirubin: 0.4 mg/dL (ref 0.3–1.2)
Total Protein: 8 g/dL (ref 6.5–8.1)

## 2018-12-04 LAB — CBC
HCT: 44 % (ref 36.0–46.0)
Hemoglobin: 14 g/dL (ref 12.0–15.0)
MCH: 31.7 pg (ref 26.0–34.0)
MCHC: 31.8 g/dL (ref 30.0–36.0)
MCV: 99.5 fL (ref 80.0–100.0)
Platelets: 236 10*3/uL (ref 150–400)
RBC: 4.42 MIL/uL (ref 3.87–5.11)
RDW: 12.5 % (ref 11.5–15.5)
WBC: 8.4 10*3/uL (ref 4.0–10.5)
nRBC: 0 % (ref 0.0–0.2)

## 2018-12-04 LAB — SARS CORONAVIRUS 2 BY RT PCR (HOSPITAL ORDER, PERFORMED IN ~~LOC~~ HOSPITAL LAB): SARS Coronavirus 2: NEGATIVE

## 2018-12-04 LAB — I-STAT BETA HCG BLOOD, ED (MC, WL, AP ONLY): I-stat hCG, quantitative: <5 m[IU]/mL (ref <5)

## 2018-12-04 LAB — LIPASE, BLOOD: Lipase: 28 U/L (ref 11–51)

## 2018-12-04 MED ORDER — SODIUM CHLORIDE 0.9 % IV SOLN
INTRAVENOUS | Status: DC
  Administered 2018-12-04: 19:00:00 via INTRAVENOUS

## 2018-12-04 MED ORDER — LOSARTAN POTASSIUM 50 MG PO TABS
50.0000 mg | ORAL_TABLET | Freq: Every day | ORAL | Status: DC
  Administered 2018-12-06: 50 mg via ORAL
  Filled 2018-12-04: qty 1

## 2018-12-04 MED ORDER — ONDANSETRON HCL 4 MG/2ML IJ SOLN
4.0000 mg | Freq: Four times a day (QID) | INTRAMUSCULAR | Status: DC | PRN
  Administered 2018-12-05: 4 mg via INTRAVENOUS

## 2018-12-04 MED ORDER — HYDROCODONE-ACETAMINOPHEN 5-325 MG PO TABS
1.0000 | ORAL_TABLET | ORAL | Status: DC | PRN
  Administered 2018-12-05 – 2018-12-06 (×2): 2 via ORAL
  Filled 2018-12-04 (×3): qty 2

## 2018-12-04 MED ORDER — SODIUM CHLORIDE 0.9% FLUSH: 3.0000 mL | Freq: Once | INTRAVENOUS | Status: DC

## 2018-12-04 MED ORDER — PROMETHAZINE HCL 25 MG/ML IJ SOLN
12.5000 mg | Freq: Once | INTRAMUSCULAR | Status: AC
  Administered 2018-12-04: 12.5 mg via INTRAVENOUS
  Filled 2018-12-04: qty 1

## 2018-12-04 MED ORDER — KCL IN DEXTROSE-NACL 20-5-0.45 MEQ/L-%-% IV SOLN
INTRAVENOUS | Status: DC
  Administered 2018-12-05: 01:00:00 via INTRAVENOUS
  Filled 2018-12-04: qty 1000

## 2018-12-04 MED ORDER — MORPHINE SULFATE (PF) 2 MG/ML IV SOLN
1.0000 mg | INTRAVENOUS | Status: DC | PRN
  Administered 2018-12-04 – 2018-12-06 (×6): 2 mg via INTRAVENOUS
  Filled 2018-12-04 (×6): qty 1

## 2018-12-04 MED ORDER — INSULIN ASPART 100 UNIT/ML ~~LOC~~ SOLN
0.0000 [IU] | SUBCUTANEOUS | Status: DC
  Administered 2018-12-05 (×2): 3 [IU] via SUBCUTANEOUS
  Administered 2018-12-05: 7 [IU] via SUBCUTANEOUS
  Administered 2018-12-06 (×2): 3 [IU] via SUBCUTANEOUS

## 2018-12-04 MED ORDER — ONDANSETRON 4 MG PO TBDP: 4.0000 mg | ORAL_TABLET | Freq: Four times a day (QID) | ORAL | Status: DC | PRN

## 2018-12-04 MED ORDER — HYDROCHLOROTHIAZIDE 12.5 MG PO CAPS
12.5000 mg | ORAL_CAPSULE | Freq: Every day | ORAL | Status: DC
  Administered 2018-12-06: 12.5 mg via ORAL
  Filled 2018-12-04: qty 1

## 2018-12-04 MED ORDER — TRAMADOL HCL 50 MG PO TABS: 50.0000 mg | ORAL_TABLET | Freq: Four times a day (QID) | ORAL | Status: DC | PRN

## 2018-12-04 MED ORDER — SODIUM CHLORIDE 0.9 % IV SOLN
2.0000 g | Freq: Every day | INTRAVENOUS | Status: DC
  Administered 2018-12-05 (×2): 2 g via INTRAVENOUS
  Filled 2018-12-04: qty 2
  Filled 2018-12-04: qty 20
  Filled 2018-12-04: qty 2

## 2018-12-04 MED ORDER — LOSARTAN POTASSIUM-HCTZ 50-12.5 MG PO TABS: 1.0000 | ORAL_TABLET | Freq: Every day | ORAL | Status: DC

## 2018-12-04 MED ORDER — HYDROMORPHONE HCL 1 MG/ML IJ SOLN
1.0000 mg | Freq: Once | INTRAMUSCULAR | Status: AC
  Administered 2018-12-04: 1 mg via INTRAVENOUS
  Filled 2018-12-04: qty 1

## 2018-12-04 NOTE — ED Notes (Signed)
ED TO INPATIENT HANDOFF REPORT  Name/Age/Gender Doris Armstrong 43 y.o. female  Code Status   Home/SNF/Other Home  Chief Complaint Abdominal pain  Level of Care/Admitting Diagnosis ED Disposition    ED Disposition Condition [facility] Area: [place] [[id number]]  Level of Care: Med-Surg [16]  Covid Evaluation: Screening Protocol (No Symptoms)  Diagnosis: Acute cholecystitis [575.0.ICD-9-CM]  Admitting Physician: CCS, [place]  Attending Physician: CCS, MD [[id number]]  Estimated length of stay: past midnight tomorrow  Certification:: I certify this patient will need inpatient services for at least 2 midnights  Bed request comments: 5 west  PT Class (Do Not Modify): Inpatient [101]  PT Acc Code (Do Not Modify): Private [1]       Medical History Past Medical History:  Diagnosis Date  . Diabetes mellitus   . Diabetes mellitus without complication ([place])   . Gall bladder stones   . Hypertension   . PCOS (polycystic ovarian syndrome)     Allergies Allergies  Allergen Reactions  . Lisinopril Swelling    IV Location/Drains/Wounds Patient Lines/Drains/Airways Status   Active Line/Drains/Airways    Name:   Placement date:   Placement time:   Site:   Days:   Peripheral IV 12/04/18 Left Antecubital   12/04/18    1758    Antecubital   less than 1          Labs/Imaging Results for orders placed or performed during the hospital encounter of 12/04/18 (from the past 48 hour(s))  Lipase, blood     Status: None   Collection Time: 12/04/18  5:57 PM  Result Value Ref Range   Lipase 28 11 - 51 U/L    Comment: Performed at [facility], [place] [address] [place], [place] [address]  Comprehensive metabolic panel     Status: Abnormal   Collection Time: 12/04/18  5:57 PM  Result Value Ref Range   Sodium 142 135 - 145 mmol/L   Potassium 4.1 3.5 - 5.1 mmol/L   Chloride 108 98 - 111 mmol/L   CO2 27 22 - 32 mmol/L   Glucose, Bld  112 (H) 70 - 99 mg/dL   BUN 16 6 - 20 mg/dL   Creatinine, Ser 0.85 0.44 - 1.00 mg/dL   Calcium 9.2 8.9 - 10.3 mg/dL   Total Protein 8.0 6.5 - 8.1 g/dL   Albumin 4.3 3.5 - 5.0 g/dL   AST 26 15 - 41 U/L   ALT 42 0 - 44 U/L   Alkaline Phosphatase 49 38 - 126 U/L   Total Bilirubin 0.4 0.3 - 1.2 mg/dL   GFR calc non Af Amer >60 >60 mL/min   GFR calc Af Amer >60 >60 mL/min   Anion gap 7 5 - 15    Comment: Performed at [facility], [place] [address] [place],  [address]  CBC     Status: None   Collection Time: 12/04/18  5:57 PM  Result Value Ref Range   WBC 8.4 4.0 - 10.5 K/uL   RBC 4.42 3.87 - 5.11 MIL/uL   Hemoglobin 14.0 12.0 - 15.0 g/dL   HCT 44.0 36.0 - 46.0 %   MCV 99.5 80.0 - 100.0 fL   MCH 31.7 26.0 - 34.0 pg   MCHC 31.8 30.0 - 36.0 g/dL   RDW 12.5 11.5 - 15.5 %   Platelets 236 150 - 400 K/uL   nRBC 0.0 0.0 - 0.2 %    Comment:  Performed at [facility], [place] [address] [place], [place] [address]  I-Stat beta hCG blood, ED     Status: None   Collection Time: 12/04/18  6:06 PM  Result Value Ref Range   I-stat hCG, quantitative <5.0 <5 mIU/mL   Comment 3            Comment:   GEST. AGE      CONC.  (mIU/mL)   <=1 WEEK        5 - 50     2 WEEKS       50 - 500     3 WEEKS       100 - 10,000     4 WEEKS     1,000 - 30,000        FEMALE AND NON-PREGNANT FEMALE:     LESS THAN 5 mIU/mL   SARS Coronavirus 2 (CEPHEID - Performed in [place] hospital lab), Hosp Order     Status: None   Collection Time: 12/04/18  7:58 PM   Specimen: Nasopharyngeal Swab  Result Value Ref Range   SARS Coronavirus 2 NEGATIVE NEGATIVE    Comment: (NOTE) If result is NEGATIVE SARS-CoV-2 target nucleic acids are NOT DETECTED. The SARS-CoV-2 RNA is generally detectable in upper and lower  respiratory specimens during the acute phase of infection. The lowest  concentration of SARS-CoV-2 viral copies this assay can detect is 250  copies / mL. A negative  result does not preclude SARS-CoV-2 infection  and should not be used as the sole basis for treatment or other  patient management decisions.  A negative result may occur with  improper specimen collection / handling, submission of specimen other  than nasopharyngeal swab, presence of viral mutation(s) within the  areas targeted by this assay, and inadequate number of viral copies  (<250 copies / mL). A negative result must be combined with clinical  observations, patient history, and epidemiological information. If result is POSITIVE SARS-CoV-2 target nucleic acids are DETECTED. The SARS-CoV-2 RNA is generally detectable in upper and lower  respiratory specimens dur ing the acute phase of infection.  Positive  results are indicative of active infection with SARS-CoV-2.  Clinical  correlation with patient history and other diagnostic information is  necessary to determine patient infection status.  Positive results do  not rule out bacterial infection or co-infection with other viruses. If result is PRESUMPTIVE POSTIVE SARS-CoV-2 nucleic acids MAY BE PRESENT.   A presumptive positive result was obtained on the submitted specimen  and confirmed on repeat testing.  While 2019 novel coronavirus  (SARS-CoV-2) nucleic acids may be present in the submitted sample  additional confirmatory testing may be necessary for epidemiological  and / or clinical management purposes  to differentiate between  SARS-CoV-2 and other Sarbecovirus currently known to infect humans.  If clinically indicated additional testing with an alternate test  methodology [phone]) is advised. The SARS-CoV-2 RNA is generally  detectable in upper and lower respiratory sp ecimens during the acute  phase of infection. The expected result is Negative. Fact Sheet for Patients:  StrictlyIdeas.no Fact Sheet for Healthcare Providers: BankingDealers.co.za This test is not yet  approved or cleared by the [place] FDA and has been authorized for detection and/or diagnosis of SARS-CoV-2 by FDA under an Emergency Use Authorization (EUA).  This EUA will remain in effect (meaning this test can be used) for the duration of the COVID-19 declaration under Section 564(b)(1) of the Act, 21 U.S.C. section 360bbb-3(b)(1),  unless the authorization is terminated or revoked sooner. Performed at [facility], [place] [address] [place], [place] [address]    US Abdomen Limited Ruq  Result Date: 12/04/2018 CLINICAL DATA:  Right upper quadrant pain. EXAM: ULTRASOUND ABDOMEN LIMITED RIGHT UPPER QUADRANT COMPARISON:  May 06, 2011 FINDINGS: Gallbladder: The gallbladder is dilated with a positive Murphy's sign and a prominent 3.3 cm stone in the neck. No wall thickening or pericholecystic fluid. Common bile duct: Diameter: 3.6 mm Liver: An oval region of decreased echogenicity seen adjacent to the gallbladder measuring 4.8 x 0.7 x 3.2 cm, likely focal fatty sparing. No other masses. Diffuse increased echogenicity in the liver. Portal vein is patent on color Doppler imaging with normal direction of blood flow towards the liver. IMPRESSION: 1. There is a 3.3 cm stone in the neck of the gallbladder with a positive Murphy's sign but no wall thickening or pericholecystic fluid. Acute cholecystitis not excluded. If there is continued clinical ambiguity, recommend a HIDA scan. 2. Probable focal fatty sparing adjacent to the gallbladder fossa. Probable hepatic steatosis. Electronically Signed   By: Dorise Bullion III M.D   On: 12/04/2018 10:54    Pending Labs Unresulted Labs (From admission, onward)    Start     Ordered   12/04/18 2216  HIV antibody (Routine Testing)  Once,   STAT     12/04/18 2215   12/04/18 1730  Urinalysis, Routine w reflex microscopic  ONCE - STAT,   STAT     12/04/18 1729   Signed and Held  CBC  Tomorrow morning,   R     Signed and Held           Vitals/Pain Today's Vitals   12/04/18 2030 12/04/18 2150 12/04/18 2200 12/04/18 2200  BP: (!) 134/97 (!) 144/89 133/90   Pulse: 80 (!) 51 (!) 56   Resp: 18 18 18    Temp:      TempSrc:      SpO2: 97% 97% 96%   PainSc:    7     Isolation Precautions No active isolations  Medications Medications  sodium chloride flush (NS) 0.9 % injection 3 mL (has no administration in time range)  0.9 %  sodium chloride infusion ( Intravenous New Bag/Given 12/04/18 1920)  morphine 2 MG/ML injection 1-4 mg (2 mg Intravenous Given 12/04/18 2219)  HYDROmorphone (DILAUDID) injection 1 mg (1 mg Intravenous Given 12/04/18 1923)  promethazine (PHENERGAN) injection 12.5 mg (12.5 mg Intravenous Given 12/04/18 1921)    Mobility walks

## 2018-12-04 NOTE — ED Provider Notes (Signed)
[facility] DEPT Provider Note   CSN: [id number] Arrival date & time: 12/04/18  1707     History   Chief Complaint Chief Complaint  Patient presents with  . Abdominal Pain    HPI Doris Armstrong is a 42 y.o. female.     HPI   72yF with abdominal pain. Epigastric. Onset Monday and then subsided. Pain came back again around 0400 today and has been persistent since. Went to UC and had [place] which was significant for 3.3 cm gallstone in GB neck. Pt's pain has been unrelenting so then came to the ED. She has had associated nausea and vomiting. No fever.   Past Medical History:  Diagnosis Date  . Diabetes mellitus   . Diabetes mellitus without complication ([place])   . Gall bladder stones   . Hypertension   . PCOS (polycystic ovarian syndrome)     Patient Active Problem List   Diagnosis Date Noted  . Essential hypertension 10/06/2015  . Hyperglycemia 10/06/2015  . Elevated LFTs 07/21/2014    History reviewed. No pertinent surgical history.   OB History   No obstetric history on file.      Home Medications    Prior to Admission medications   Medication Sig Start Date End Date Taking? Authorizing Provider  losartan-hydrochlorothiazide (HYZAAR) 50-12.5 MG tablet Take 1 tablet by mouth daily. 03/09/18  Yes Jaynee Eagles, PA-C  metFORMIN (GLUCOPHAGE) 500 MG tablet Take one tablet with lunch and 2 tablets with dinner 10/26/18  Yes Delia Chimes A, MD  carvedilol (COREG) 6.25 MG tablet Take 1 tablet (6.25 mg total) by mouth 2 (two) times daily with a meal. Patient not taking: Reported on 10/26/2018 09/21/18   Forrest Moron, MD    Family History No family history on file.  Social History Social History   Tobacco Use  . Smoking status: Former Research scientist (life sciences)  . Smokeless tobacco: Never Used  Substance Use Topics  . Alcohol use: Yes  . Drug use: No     Allergies   Lisinopril   Review of Systems Review of Systems  All systems reviewed and  negative, other than as noted in HPI.  Physical Exam Updated Vital Signs BP (!) 130/93   Pulse (!) 55   Temp 98.8 F (37.1 C) (Oral)   Resp 18   SpO2 94%   Physical Exam Vitals signs and nursing note reviewed.  Constitutional:      General: She is not in acute distress.    Appearance: She is well-developed. She is obese.  HENT:     Head: Normocephalic and atraumatic.  Eyes:     General:        Right eye: No discharge.        Left eye: No discharge.     Conjunctiva/sclera: Conjunctivae normal.  Neck:     Musculoskeletal: Neck supple.  Cardiovascular:     Rate and Rhythm: Normal rate and regular rhythm.     Heart sounds: Normal heart sounds. No murmur. No friction rub. No gallop.   Pulmonary:     Effort: Pulmonary effort is normal. No respiratory distress.     Breath sounds: Normal breath sounds.  Abdominal:     General: There is no distension.     Palpations: Abdomen is soft.     Tenderness: There is abdominal tenderness in the right upper quadrant and epigastric area. There is no guarding or rebound.  Musculoskeletal:        General: No tenderness.  Skin:    General: Skin is warm and dry.  Neurological:     Mental Status: She is alert.  Psychiatric:        Behavior: Behavior normal.        Thought Content: Thought content normal.      ED Treatments / Results  Labs (all labs ordered are listed, but only abnormal results are displayed) Labs Reviewed  COMPREHENSIVE METABOLIC PANEL - Abnormal; Notable for the following components:      Result Value   Glucose, Bld 112 (*)    All other components within normal limits  CBC - Abnormal; Notable for the following components:   MCV 101.0 (*)    All other components within normal limits  GLUCOSE, CAPILLARY - Abnormal; Notable for the following components:   Glucose-Capillary 131 (*)    All other components within normal limits  GLUCOSE, CAPILLARY - Abnormal; Notable for the following components:   Glucose-Capillary  121 (*)    All other components within normal limits  GLUCOSE, CAPILLARY - Abnormal; Notable for the following components:   Glucose-Capillary 120 (*)    All other components within normal limits  GLUCOSE, CAPILLARY - Abnormal; Notable for the following components:   Glucose-Capillary 236 (*)    All other components within normal limits  SARS CORONAVIRUS 2 (HOSPITAL ORDER, [place] [facility])  SURGICAL PCR SCREEN  LIPASE, BLOOD  CBC  HIV ANTIBODY (ROUTINE TESTING W REFLEX)  I-STAT BETA HCG BLOOD, ED ([facility], [facility], [facility] ONLY)  SURGICAL PATHOLOGY    EKG    Radiology US Abdomen Limited Ruq  Result Date: 12/04/2018 CLINICAL DATA:  Right upper quadrant pain. EXAM: ULTRASOUND ABDOMEN LIMITED RIGHT UPPER QUADRANT COMPARISON:  May 06, 2011 FINDINGS: Gallbladder: The gallbladder is dilated with a positive Murphy's sign and a prominent 3.3 cm stone in the neck. No wall thickening or pericholecystic fluid. Common bile duct: Diameter: 3.6 mm Liver: An oval region of decreased echogenicity seen adjacent to the gallbladder measuring 4.8 x 0.7 x 3.2 cm, likely focal fatty sparing. No other masses. Diffuse increased echogenicity in the liver. Portal vein is patent on color Doppler imaging with normal direction of blood flow towards the liver. IMPRESSION: 1. There is a 3.3 cm stone in the neck of the gallbladder with a positive Murphy's sign but no wall thickening or pericholecystic fluid. Acute cholecystitis not excluded. If there is continued clinical ambiguity, recommend a HIDA scan. 2. Probable focal fatty sparing adjacent to the gallbladder fossa. Probable hepatic steatosis. Electronically Signed   By: Dorise Bullion III M.D   On: 12/04/2018 10:54    Procedures Procedures (including critical care time)  Medications Ordered in ED Medications  sodium chloride flush (NS) 0.9 % injection 3 mL (has no administration in time range)  0.9 %  sodium chloride infusion ( Intravenous New  Bag/Given 12/04/18 1920)  HYDROmorphone (DILAUDID) injection 1 mg (1 mg Intravenous Given 12/04/18 1923)  promethazine (PHENERGAN) injection 12.5 mg (12.5 mg Intravenous Given 12/04/18 1921)     Initial Impression / Assessment and Plan / ED Course  I have reviewed the triage vital signs and the nursing notes.  Pertinent labs & imaging results that were available during my care of the patient were reviewed by me and considered in my medical decision making (see chart for details).    42yF with upper abdominal pain. Large stone in GB neck. Still very symptomatic after meds. LFTs/lipase normal. Discussed with Dr Lucia Gaskins, general surgery. Admit for lap  chole tomorrow.   Final Clinical Impressions(s) / ED Diagnoses   Final diagnoses:  Calculus of gallbladder without cholecystitis without obstruction    ED Discharge Orders    None       Virgel Manifold, MD 12/05/18 1550

## 2018-12-04 NOTE — H&P (Addendum)
Re:   Doris Armstrong DOB:   January 13, 1976 MRN:   [id number]  Chief Complaint Abdominal pain  ASSESEMENT AND PLAN: 1.  Acute cholecystitis, cholelithiasis  I discussed with the patient the indications and risks of gall bladder surgery.  The primary risks of gall bladder surgery include, but are not limited to, bleeding, infection, common bile duct injury, and open surgery.  There is also the risk that the patient may have continued symptoms after surgery.  We discussed the typical post-operative recovery course. I tried to answer the patient's questions.  I gave the patient literature about gall bladder surgery.  Plan surgery tomorrow AM.  Her contact person is Janett Billow, friend, [phone].  2.  DM  Glucose - 112 - 12/04/2018  HgbA1C - 8.6 on 11/30/2018 3.  HTN 4.  Morbid obesity - BMI - 41 5.  PCOS - "resolved"  Chief Complaint  Patient presents with  . Abdominal Pain   PHYSICIAN REQUESTING CONSULTATION:  Dr. Marylen Ponto, [facility]  HISTORY OF PRESENT ILLNESS: Doris Armstrong is a 43 y.o. (DOB: Dec 04, 1975)  AA female whose primary care physician is Doristine Bosworth, MD    She's had known gall stones for years.  They were not bothering her, so she did not bother them.  Her last attack was about 3 years ago.  Then she had abdominal pain this past Monday, 6/8, but her pain got better quickly.  Then this morning, she had pain in her right abdomen.  She first went to [facility]gent Care in [facility].  They ordered an [place] at [facility].  She was able to go to work for a while today - but then came to the [facility].  She has no history of stomach or colon disease.  She said that she has had elevated liver function test in the past.  This has been attributed to fatty liver or EtOH.  She said that she drank regularly until 6 months ago, now she has wine about twice a week.  She's had no prior abodminal surgery.  She has no fam hx of gallstones.  [place] of abdomen - 12/04/2018 - 1. There is a 3.3 cm  stone in the neck of the gallbladder with a positive Murphy's sign but no wall thickening or pericholecystic fluid. Acute cholecystitis not excluded. If there is continued clinical ambiguity, recommend a HIDA scan.  2. Probable focal fatty sparing adjacent to the gallbladder fossa. Probable hepatic steatosis. WBC - 8,400 - 12/04/2018    Past Medical History:  Diagnosis Date  . Diabetes mellitus   . Diabetes mellitus without complication ([facility])   . Gall bladder stones   . Hypertension   . PCOS (polycystic ovarian syndrome)      History reviewed. No pertinent surgical history.    Current Facility-Administered Medications  Medication Dose Route Frequency Provider Last Rate Last Dose  . 0.9 %  sodium chloride infusion   Intravenous Continuous Raeford Razor, MD 100 mL/hr at 12/04/18 1920    . sodium chloride flush (NS) 0.9 % injection 3 mL  3 mL Intravenous Once Raeford Razor, MD       Current Outpatient Medications  Medication Sig Dispense Refill  . losartan-hydrochlorothiazide (HYZAAR) 50-12.5 MG tablet Take 1 tablet by mouth daily. 90 tablet 3  . metFORMIN (GLUCOPHAGE) 500 MG tablet Take one tablet with lunch and 2 tablets with dinner 180 tablet 3  . carvedilol (COREG) 6.25 MG tablet Take 1 tablet (6.25 mg total) by  mouth 2 (two) times daily with a meal. (Patient not taking: Reported on 10/26/2018) 60 tablet 6      Allergies  Allergen Reactions  . Lisinopril Swelling    REVIEW OF SYSTEMS: Skin:  No history of rash.  No history of abnormal moles. Infection:  No history of hepatitis or HIV.  No history of MRSA. Neurologic:  No history of stroke.  No history of seizure.  No history of headaches. Cardiac:  No history of hypertension. No history of heart disease.  No history of prior cardiac catheterization.  No history of seeing a cardiologist. Pulmonary:  Does not smoke cigarettes.  No asthma or bronchitis.  No OSA/CPAP.  Endocrine:  DM.  No thyroid disease. Gastrointestinal:   See HPI Urologic:  No history of kidney stones.  No history of bladder infections. Musculoskeletal:  No history of joint or back disease. Hematologic:  No bleeding disorder.  No history of anemia.  Not anticoagulated. Psycho-social:  The patient is oriented.   The patient has no obvious psychologic or social impairment to understanding our conversation and plan.  SOCIAL and FAMILY HISTORY: Not married. Has 2 adopted sons - 12 yo and 57 yo. She works at the [employer] and in [employer]. Her dad is Dory Horn.  No family history of gall stones.  PHYSICAL EXAM: BP (!) 130/93   Pulse (!) 55   Temp 98.8 F (37.1 C) (Oral)   Resp 18   SpO2 94%   General: Obese AA F who is alert and generally healthy appearing.  Skin:  Inspection and palpation - no mass or rash. Eyes:  Conjunctiva and lids unremarkable.            Pupils are equal Ears, Nose, Mouth, and Throat:  Ears and nose unremarkable            Lips and teeth are unremarable. Neck: Supple. No mass, trachea midline.  No thyroid mass. Lymph Nodes:  No supraclavicular, cervical, or inguinal nodes. Lungs: Normal respiratory effort.  Clear to auscultation and symmetric breath sounds. Heart:  Palpation of the heart is normal.            Auscultation: RRR. No murmur or rub.  Abdomen: Soft. No mass.Sore in epigastrium No abdominal scars. Rectal: Not done. Musculoskeletal:  Normal gait.            Good muscle strength and ROM  in upper and lower extremities.  Neurologic:  Grossly intact to motor and sensory function. Psychiatric: Normal judgement and insight. Behavior is normal.            Oriented to time, person, place.   DATA REVIEWED, COUNSELING AND COORDINATION OF CARE: Epic notes reviewed. Counseling and coordination of care exceeded more than 50% of the time spent with patient. Total time spent with patient and charting: 45 minutes  Ovidio Kin, MD,  [facility], [place] [address] [place]  [address]   [place], [place]    [address] Phone:  [phone] FAX:  [phone]

## 2018-12-04 NOTE — ED Notes (Signed)
Pt. Made aware for the need of urine specimen. 

## 2018-12-04 NOTE — ED Notes (Signed)
Provider at bedside

## 2018-12-04 NOTE — ED Notes (Signed)
Pt c/o of 7/10 pain. Admitting provider notified, awaiting orders.

## 2018-12-04 NOTE — ED Triage Notes (Signed)
Per pt, states abdominal pain that started Monday-states pain went away till 0400 this morning-states she went to UC today and had imaging done-was told shje has a Gallstone-sent here for eval

## 2018-12-04 NOTE — ED Notes (Signed)
General Surgery at bedside.

## 2018-12-05 ENCOUNTER — Inpatient Hospital Stay (HOSPITAL_COMMUNITY): Payer: 59

## 2018-12-05 ENCOUNTER — Inpatient Hospital Stay (HOSPITAL_COMMUNITY): Payer: 59 | Admitting: Certified Registered Nurse Anesthetist

## 2018-12-05 ENCOUNTER — Encounter (HOSPITAL_COMMUNITY): Payer: Self-pay | Admitting: Certified Registered Nurse Anesthetist

## 2018-12-05 ENCOUNTER — Encounter (HOSPITAL_COMMUNITY)
Admission: EM | Disposition: A | Discharge status: Home / Self Care | Payer: Self-pay | Source: Home / Self Care | Attending: Emergency Medicine

## 2018-12-05 HISTORY — PX: CHOLECYSTECTOMY: SHX55

## 2018-12-05 LAB — GLUCOSE, CAPILLARY
Glucose-Capillary: 105 mg/dL — ABNORMAL HIGH (ref 70–99)
Glucose-Capillary: 120 mg/dL — ABNORMAL HIGH (ref 70–99)
Glucose-Capillary: 121 mg/dL — ABNORMAL HIGH (ref 70–99)
Glucose-Capillary: 131 mg/dL — ABNORMAL HIGH (ref 70–99)
Glucose-Capillary: 236 mg/dL — ABNORMAL HIGH (ref 70–99)
Glucose-Capillary: 90 mg/dL (ref 70–99)

## 2018-12-05 LAB — CBC
HCT: 41.3 % (ref 36.0–46.0)
Hemoglobin: 12.9 g/dL (ref 12.0–15.0)
MCH: 31.5 pg (ref 26.0–34.0)
MCHC: 31.2 g/dL (ref 30.0–36.0)
MCV: 101 fL — ABNORMAL HIGH (ref 80.0–100.0)
Platelets: 196 10*3/uL (ref 150–400)
RBC: 4.09 MIL/uL (ref 3.87–5.11)
RDW: 12.6 % (ref 11.5–15.5)
WBC: 9.6 10*3/uL (ref 4.0–10.5)
nRBC: 0 % (ref 0.0–0.2)

## 2018-12-05 LAB — SURGICAL PCR SCREEN
MRSA, PCR: NEGATIVE
Staphylococcus aureus: NEGATIVE

## 2018-12-05 LAB — HIV ANTIBODY (ROUTINE TESTING W REFLEX): HIV Screen 4th Generation wRfx: NONREACTIVE

## 2018-12-05 SURGERY — LAPAROSCOPIC CHOLECYSTECTOMY WITH INTRAOPERATIVE CHOLANGIOGRAM
Anesthesia: General | Site: Abdomen

## 2018-12-05 MED ORDER — LACTATED RINGERS IV SOLN
INTRAVENOUS | Status: AC | PRN
  Administered 2018-12-05: 1000 mL

## 2018-12-05 MED ORDER — IOHEXOL 300 MG/ML  SOLN
INTRAMUSCULAR | Status: DC | PRN
  Administered 2018-12-05: 10 mL

## 2018-12-05 MED ORDER — BUPIVACAINE-EPINEPHRINE 0.25% -1:200000 IJ SOLN
INTRAMUSCULAR | Status: DC | PRN
  Administered 2018-12-05: 30 mL

## 2018-12-05 MED ORDER — PROPOFOL 10 MG/ML IV BOLUS
INTRAVENOUS | Status: AC
  Filled 2018-12-05: qty 40

## 2018-12-05 MED ORDER — DEXAMETHASONE SODIUM PHOSPHATE 10 MG/ML IJ SOLN
INTRAMUSCULAR | Status: AC
  Filled 2018-12-05: qty 1

## 2018-12-05 MED ORDER — FENTANYL CITRATE (PF) 250 MCG/5ML IJ SOLN
INTRAMUSCULAR | Status: AC
  Filled 2018-12-05: qty 5

## 2018-12-05 MED ORDER — MIDAZOLAM HCL 5 MG/5ML IJ SOLN
INTRAMUSCULAR | Status: DC | PRN
  Administered 2018-12-05: 2 mg via INTRAVENOUS

## 2018-12-05 MED ORDER — FENTANYL CITRATE (PF) 100 MCG/2ML IJ SOLN: 25.0000 ug | INTRAMUSCULAR | Status: DC | PRN

## 2018-12-05 MED ORDER — LIDOCAINE 2% (20 MG/ML) 5 ML SYRINGE
INTRAMUSCULAR | Status: AC
  Filled 2018-12-05: qty 5

## 2018-12-05 MED ORDER — SUGAMMADEX SODIUM 200 MG/2ML IV SOLN
INTRAVENOUS | Status: DC | PRN
  Administered 2018-12-05: 400 mg via INTRAVENOUS

## 2018-12-05 MED ORDER — OXYCODONE HCL 5 MG/5ML PO SOLN: 5.0000 mg | Freq: Once | ORAL | Status: DC | PRN

## 2018-12-05 MED ORDER — 0.9 % SODIUM CHLORIDE (POUR BTL) OPTIME
TOPICAL | Status: DC | PRN
  Administered 2018-12-05: 08:00:00 1000 mL

## 2018-12-05 MED ORDER — KCL IN DEXTROSE-NACL 20-5-0.45 MEQ/L-%-% IV SOLN
INTRAVENOUS | Status: DC
  Administered 2018-12-05 – 2018-12-06 (×2): via INTRAVENOUS
  Filled 2018-12-05 (×2): qty 1000

## 2018-12-05 MED ORDER — SUCCINYLCHOLINE CHLORIDE 200 MG/10ML IV SOSY
PREFILLED_SYRINGE | INTRAVENOUS | Status: AC
  Filled 2018-12-05: qty 10

## 2018-12-05 MED ORDER — BUPIVACAINE-EPINEPHRINE (PF) 0.25% -1:200000 IJ SOLN
INTRAMUSCULAR | Status: AC
  Filled 2018-12-05: qty 30

## 2018-12-05 MED ORDER — PROPOFOL 10 MG/ML IV BOLUS
INTRAVENOUS | Status: DC | PRN
  Administered 2018-12-05: 180 mg via INTRAVENOUS

## 2018-12-05 MED ORDER — OXYCODONE HCL 5 MG PO TABS: 5.0000 mg | ORAL_TABLET | Freq: Once | ORAL | Status: DC | PRN

## 2018-12-05 MED ORDER — ONDANSETRON HCL 4 MG/2ML IJ SOLN
INTRAMUSCULAR | Status: AC
  Filled 2018-12-05: qty 2

## 2018-12-05 MED ORDER — PHENYLEPHRINE 40 MCG/ML (10ML) SYRINGE FOR IV PUSH (FOR BLOOD PRESSURE SUPPORT)
PREFILLED_SYRINGE | INTRAVENOUS | Status: DC | PRN
  Administered 2018-12-05: 80 ug via INTRAVENOUS

## 2018-12-05 MED ORDER — MIDAZOLAM HCL 2 MG/2ML IJ SOLN
INTRAMUSCULAR | Status: AC
  Filled 2018-12-05: qty 2

## 2018-12-05 MED ORDER — ONDANSETRON HCL 4 MG/2ML IJ SOLN: 4.0000 mg | Freq: Once | INTRAMUSCULAR | Status: DC | PRN

## 2018-12-05 MED ORDER — ROCURONIUM BROMIDE 10 MG/ML (PF) SYRINGE
PREFILLED_SYRINGE | INTRAVENOUS | Status: AC
  Filled 2018-12-05: qty 10

## 2018-12-05 MED ORDER — FENTANYL CITRATE (PF) 100 MCG/2ML IJ SOLN
INTRAMUSCULAR | Status: DC | PRN
  Administered 2018-12-05: 50 ug via INTRAVENOUS
  Administered 2018-12-05: 100 ug via INTRAVENOUS
  Administered 2018-12-05 (×2): 50 ug via INTRAVENOUS

## 2018-12-05 MED ORDER — LACTATED RINGERS IV SOLN
INTRAVENOUS | Status: DC | PRN
  Administered 2018-12-05 (×2): via INTRAVENOUS

## 2018-12-05 MED ORDER — DEXAMETHASONE SODIUM PHOSPHATE 4 MG/ML IJ SOLN
INTRAMUSCULAR | Status: DC | PRN
  Administered 2018-12-05: 10 mg via INTRAVENOUS

## 2018-12-05 MED ORDER — SUGAMMADEX SODIUM 500 MG/5ML IV SOLN
INTRAVENOUS | Status: AC
  Filled 2018-12-05: qty 5

## 2018-12-05 MED ORDER — LIDOCAINE 2% (20 MG/ML) 5 ML SYRINGE
INTRAMUSCULAR | Status: DC | PRN
  Administered 2018-12-05: 60 mg via INTRAVENOUS

## 2018-12-05 MED ORDER — ROCURONIUM BROMIDE 10 MG/ML (PF) SYRINGE
PREFILLED_SYRINGE | INTRAVENOUS | Status: DC | PRN
  Administered 2018-12-05: 20 mg via INTRAVENOUS
  Administered 2018-12-05: 50 mg via INTRAVENOUS

## 2018-12-05 MED ORDER — SUCCINYLCHOLINE CHLORIDE 200 MG/10ML IV SOSY
PREFILLED_SYRINGE | INTRAVENOUS | Status: DC | PRN
  Administered 2018-12-05: 160 mg via INTRAVENOUS

## 2018-12-05 SURGICAL SUPPLY — 38 items
APPLIER CLIP 5 13 M/L LIGAMAX5 (MISCELLANEOUS)
APPLIER CLIP ROT 10 11.4 M/L (STAPLE) ×2
CABLE HIGH FREQUENCY MONO STRZ (ELECTRODE) ×2 IMPLANT
CHLORAPREP W/TINT 26 (MISCELLANEOUS) ×2 IMPLANT
CHOLANGIOGRAM CATH TAUT (CATHETERS) ×2 IMPLANT
CLIP APPLIE 5 13 M/L LIGAMAX5 (MISCELLANEOUS) IMPLANT
CLIP APPLIE ROT 10 11.4 M/L (STAPLE) ×1 IMPLANT
COVER MAYO STAND STRL (DRAPES) ×2 IMPLANT
COVER SURGICAL LIGHT HANDLE (MISCELLANEOUS) ×2 IMPLANT
COVER WAND RF STERILE (DRAPES) IMPLANT
DECANTER SPIKE VIAL GLASS SM (MISCELLANEOUS) ×2 IMPLANT
DERMABOND ADVANCED (GAUZE/BANDAGES/DRESSINGS) ×1
DERMABOND ADVANCED .7 DNX12 (GAUZE/BANDAGES/DRESSINGS) ×1 IMPLANT
DRAPE C-ARM 42X120 X-RAY (DRAPES) ×2 IMPLANT
ELECT REM PT RETURN 15FT ADLT (MISCELLANEOUS) ×2 IMPLANT
GLOVE SURG SIGNA 7.5 PF LTX (GLOVE) ×2 IMPLANT
GOWN STRL REUS W/TWL XL LVL3 (GOWN DISPOSABLE) ×6 IMPLANT
HEMOSTAT SURGICEL 4X8 (HEMOSTASIS) IMPLANT
IV CATH 14GX2 1/4 (CATHETERS) ×2 IMPLANT
IV SET EXTENSION CATH 6 NF (IV SETS) ×2 IMPLANT
KIT BASIN OR (CUSTOM PROCEDURE TRAY) ×2 IMPLANT
KIT TURNOVER KIT A (KITS) IMPLANT
POUCH RETRIEVAL ECOSAC 10 (ENDOMECHANICALS) ×1 IMPLANT
POUCH RETRIEVAL ECOSAC 10MM (ENDOMECHANICALS) ×1
SCISSORS LAP 5X35 DISP (ENDOMECHANICALS) ×2 IMPLANT
SET IRRIG TUBING LAPAROSCOPIC (IRRIGATION / IRRIGATOR) ×2 IMPLANT
SET TUBE SMOKE EVAC HIGH FLOW (TUBING) ×2 IMPLANT
SLEEVE ADV FIXATION 5X100MM (TROCAR) ×2 IMPLANT
STOPCOCK 4 WAY LG BORE MALE ST (IV SETS) ×2 IMPLANT
STRIP CLOSURE SKIN 1/4X4 (GAUZE/BANDAGES/DRESSINGS) IMPLANT
SUT MNCRL AB 4-0 PS2 18 (SUTURE) ×2 IMPLANT
SYR 10ML ECCENTRIC (SYRINGE) ×2 IMPLANT
TOWEL OR 17X26 10 PK STRL BLUE (TOWEL DISPOSABLE) ×2 IMPLANT
TOWEL OR NON WOVEN STRL DISP B (DISPOSABLE) ×2 IMPLANT
TRAY LAPAROSCOPIC (CUSTOM PROCEDURE TRAY) ×2 IMPLANT
TROCAR ADV FIXATION 11X100MM (TROCAR) IMPLANT
TROCAR ADV FIXATION 5X100MM (TROCAR) ×2 IMPLANT
TROCAR XCEL BLUNT TIP 100MML (ENDOMECHANICALS) ×2 IMPLANT

## 2018-12-05 NOTE — Transfer of Care (Signed)
Immediate Anesthesia Transfer of Care Note  Patient: Doris Armstrong  Procedure(s) Performed: LAPAROSCOPIC CHOLECYSTECTOMY WITH INTRAOPERATIVE CHOLANGIOGRAM (N/A Abdomen)  Patient Location: PACU  Anesthesia Type:General  Level of Consciousness: awake and patient cooperative  Airway & Oxygen Therapy: Patient Spontanous Breathing and Patient connected to face mask  Post-op Assessment: Report given to RN and Post -op Vital signs reviewed and stable  Post vital signs: Reviewed and stable  Last Vitals:  Vitals Value Taken Time  BP 110/70 12/05/18 0919  Temp    Pulse 88 12/05/18 0921  Resp 16 12/05/18 0921  SpO2 100 % 12/05/18 0921  Vitals shown include unvalidated device data.  Last Pain:  Vitals:   12/05/18 0604  TempSrc: Oral  PainSc:       Patients Stated Pain Goal: 2 (92/95/74 7340)  Complications: No apparent anesthesia complications

## 2018-12-05 NOTE — Anesthesia Procedure Notes (Signed)
Procedure Name: Intubation Date/Time: 12/05/2018 8:01 AM Performed by: Claudia Desanctis, CRNA Pre-anesthesia Checklist: Patient identified, Emergency Drugs available, Suction available and Patient being monitored Patient Re-evaluated:Patient Re-evaluated prior to induction Oxygen Delivery Method: Circle system utilized Preoxygenation: Pre-oxygenation with 100% oxygen Induction Type: IV induction, Rapid sequence and Cricoid Pressure applied Laryngoscope Size: 2 and Miller Grade View: Grade I Tube type: Oral Tube size: 7.5 mm Number of attempts: 1 Airway Equipment and Method: Stylet Placement Confirmation: ETT inserted through vocal cords under direct vision,  positive ETCO2 and breath sounds checked- equal and bilateral Secured at: 21 cm Tube secured with: Tape Dental Injury: Teeth and Oropharynx as per pre-operative assessment

## 2018-12-05 NOTE — Plan of Care (Signed)
Patient lying in bed; complaining of abdominal discomfort. Does not request pain medication at this time. Ready for surgery; taken down to PACU. Will continue to monitor on return postop.

## 2018-12-05 NOTE — Anesthesia Preprocedure Evaluation (Addendum)
Anesthesia Evaluation  Patient identified by MRN, date of birth, ID band Patient awake    Reviewed: Allergy & Precautions, NPO status , Patient's Chart, lab work & pertinent test results  Airway Mallampati: III  TM Distance: >3 FB Neck ROM: Full    Dental  (+) Teeth Intact, Dental Advisory Given   Pulmonary former smoker,    breath sounds clear to auscultation       Cardiovascular hypertension,  Rhythm:Regular Rate:Normal     Neuro/Psych    GI/Hepatic   Endo/Other  diabetes  Renal/GU      Musculoskeletal   Abdominal (+) + obese,   Peds  Hematology   Anesthesia Other Findings   Reproductive/Obstetrics                            Anesthesia Physical Anesthesia Plan  ASA: III  Anesthesia Plan: General   Post-op Pain Management:    Induction: Intravenous  PONV Risk Score and Plan: Ondansetron and Dexamethasone  Airway Management Planned: Oral ETT  Additional Equipment:   Intra-op Plan:   Post-operative Plan: Extubation in OR  Informed Consent: I have reviewed the patients History and Physical, chart, labs and discussed the procedure including the risks, benefits and alternatives for the proposed anesthesia with the patient or authorized representative who has indicated his/her understanding and acceptance.     Dental advisory given  Plan Discussed with: CRNA and Anesthesiologist  Anesthesia Plan Comments:        Anesthesia Quick Evaluation

## 2018-12-05 NOTE — Op Note (Addendum)
12/04/2018 - 12/05/2018  9:10 AM  PATIENT:  Doris Armstrong, 43 y.o., female, MRN: [id number]  PREOP DIAGNOSIS:  ACUTE CHOLECYSTITIS  POSTOP DIAGNOSIS:   Acute edematous cholecystitis, cholelithiasis  PROCEDURE:   Procedure(s): LAPAROSCOPIC CHOLECYSTECTOMY WITH INTRAOPERATIVE CHOLANGIOGRAM  SURGEON:   Ovidio Kin, M.D.  ASSISTANTWarren Lacy, M.D.  ANESTHESIA:   general  Anesthesiologist: Kipp Brood, MD CRNA: Vanessa [place], CRNA  General  ASA: 3E  EBL:  minimal  ml  BLOOD ADMINISTERED: none  DRAINS: none   LOCAL MEDICATIONS USED:   30 cc 1/4% marcaine  SPECIMEN:   Gall bladder  COUNTS CORRECT:  YES  INDICATIONS FOR PROCEDURE:  Doris Armstrong is a 43 y.o. (DOB: 07/09/1975) AA female whose primary care physician is Doristine Bosworth, MD and comes for cholecystectomy.   The indications and risks of the gall bladder surgery were explained to the patient.  The risks include, but are not limited to, infection, bleeding, common bile duct injury and open surgery.  SURGERY:  The patient was taken to OR room #1 at [facility].  The abdomen was prepped with chloroprep.  The patient was given Rocephin prior to the beginning of the operation.   A time out was held and the surgical checklist run.   An infraumbilical incision was made into the abdominal cavity.  A 12 mm Hasson trocar was inserted into the abdominal cavity through the infraumbilical incision and secured with a 0 Vicryl suture.  Three additional trocars were inserted: a 10 mm trocar in the sub-xiphoid location, a 5 mm trocar in the right mid subcostal area, and a 5 mm trocar in the right lateral subcostal area.   The abdomen was explored and the liver, stomach, and bowel that could be seen were unremarkable.   The gall bladder was edematous and swollen consisten with acute edematous cholecystitis.  I first decompressed the gall bladder, it had "white" bile.  There was bile spilled in the peritoneal  cavity.   I grasped the gall bladder and rotated it cephalad.  Disssection was carried down to the gall bladder/cystic duct junction and the cystic duct isolated.  A clip was placed on the gall bladder side of the cystic duct.   An intra-operative cholangiogram was shot.   The intra-operative cholangiogram was shot using a cut off Taut catheter placed through a 14 gauge angiocath in the RUQ.  The Taut catheter was inserted in the cut cystic duct and secured with an endoclip.  A cholangiogram was shot with 10 cc of 1/2 strength Isoview.  Using fluoroscopy, the cholangiogram showed the flow of contrast into the common bile duct, up the hepatic radicals, and into the duodenum.  There was no mass or obstruction.  This was a normal intra-operative cholangiogram.   The Taut catheter was removed.  The cystic duct was tripley endoclipped and the cystic artery was identified and clipped.  The gall bladder was bluntly and sharpley dissected from the gall bladder bed.   After the gall bladder was removed from the liver, the gall bladder bed and Triangle of [place] were inspected.  There was no bleeding or bile leak.  The gall bladder was placed in a Ecco Sac bag and delivered through the umbilicus.  The abdomen was irrigated with 1,000 cc saline.   The trocars were then removed.  I infiltrated 30cc of 1/4% Marcaine into the incisions.  The umbilical port closed with a 0 Vicryl suture and the skin closed with  4-0 Monocryl.  The skin was painted with DermaBond.  The patient's sponge and needle count were correct.  The patient was transported to the RR in good condition.   I have a surgeon as a first assist to retract, expose, and assist on this difficult operation.  Ovidio Kin, MD, [facility] Pager: [phone] Office phone:  [phone]

## 2018-12-05 NOTE — Anesthesia Postprocedure Evaluation (Signed)
Anesthesia Post Note  Patient: Doris Armstrong  Procedure(s) Performed: LAPAROSCOPIC CHOLECYSTECTOMY WITH INTRAOPERATIVE CHOLANGIOGRAM (N/A Abdomen)     Patient location during evaluation: PACU Anesthesia Type: General Level of consciousness: awake and alert Pain management: pain level controlled Vital Signs Assessment: post-procedure vital signs reviewed and stable Respiratory status: spontaneous breathing, nonlabored ventilation, respiratory function stable and patient connected to nasal cannula oxygen Cardiovascular status: blood pressure returned to baseline and stable Postop Assessment: no apparent nausea or vomiting Anesthetic complications: no    Last Vitals:  Vitals:   12/05/18 1130 12/05/18 1300  BP: 122/80 125/76  Pulse: 76 68  Resp: 15 16  Temp: 36.9 C 36.9 C  SpO2: 97% 97%    Last Pain:  Vitals:   12/05/18 1353  TempSrc:   PainSc: 2                  Bretton Tandy COKER

## 2018-12-06 ENCOUNTER — Encounter (HOSPITAL_COMMUNITY): Payer: Self-pay | Admitting: Surgery

## 2018-12-06 LAB — GLUCOSE, CAPILLARY
Glucose-Capillary: 119 mg/dL — ABNORMAL HIGH (ref 70–99)
Glucose-Capillary: 124 mg/dL — ABNORMAL HIGH (ref 70–99)
Glucose-Capillary: 125 mg/dL — ABNORMAL HIGH (ref 70–99)

## 2018-12-06 MED ORDER — HYDROCODONE-ACETAMINOPHEN 5-325 MG PO TABS: 1.0000 | ORAL_TABLET | Freq: Four times a day (QID) | ORAL | 0 refills | Status: DC | PRN

## 2018-12-06 NOTE — Discharge Summary (Signed)
Physician Discharge Summary  Patient ID:  Doris Armstrong  MRN: [id number]  DOB/AGE: 1976-04-19 43 y.o.  Admit date: 12/04/2018 Discharge date: 12/06/2018  Discharge Diagnoses:  1.  Acute cholecystitis, cholelithiasis 2.  DM             HgbA1C - 8.6 on 11/30/2018 3.  HTN 4.  Morbid obesity - BMI - 41 5.  PCOS - "resolved"  Active Problems:   Acute cholecystitis   Operation: Procedure(s): LAPAROSCOPIC CHOLECYSTECTOMY WITH INTRAOPERATIVE CHOLANGIOGRAM on 12/05/2018 - D. Ezzard Standing  Discharged Condition: good  Hospital Course: Doris Armstrong is an 43 y.o. female whose primary care physician is Doristine Bosworth, MD and who was admitted 12/04/2018 with a chief complaint of  Chief Complaint  Patient presents with  . Abdominal Pain  .   She was brought to the operating room on 12/05/2018 and underwent  LAPAROSCOPIC CHOLECYSTECTOMY WITH INTRAOPERATIVE CHOLANGIOGRAM.  She is doing well.  She is ready to go home. Her contact person is Janett Billow, friend, [phone].  The discharge instructions were reviewed with the patient.  Consults: None  Significant Diagnostic Studies: Results for orders placed or performed during the hospital encounter of 12/04/18  SARS Coronavirus 2 (CEPHEID - Performed in [facility] hospital lab), [facility] Order   Specimen: Nasopharyngeal Swab  Result Value Ref Range   SARS Coronavirus 2 NEGATIVE NEGATIVE  Surgical PCR screen   Specimen: Nasal Mucosa; Nasal Swab  Result Value Ref Range   MRSA, PCR NEGATIVE NEGATIVE   Staphylococcus aureus NEGATIVE NEGATIVE  Lipase, blood  Result Value Ref Range   Lipase 28 11 - 51 U/L  Comprehensive metabolic panel  Result Value Ref Range   Sodium 142 135 - 145 mmol/L   Potassium 4.1 3.5 - 5.1 mmol/L   Chloride 108 98 - 111 mmol/L   CO2 27 22 - 32 mmol/L   Glucose, Bld 112 (H) 70 - 99 mg/dL   BUN 16 6 - 20 mg/dL   Creatinine, Ser 2.72 0.44 - 1.00 mg/dL   Calcium 9.2 8.9 - 53.6 mg/dL   Total Protein 8.0 6.5 - 8.1 g/dL    Albumin 4.3 3.5 - 5.0 g/dL   AST 26 15 - 41 U/L   ALT 42 0 - 44 U/L   Alkaline Phosphatase 49 38 - 126 U/L   Total Bilirubin 0.4 0.3 - 1.2 mg/dL   GFR calc non Af Amer >60 >60 mL/min   GFR calc Af Amer >60 >60 mL/min   Anion gap 7 5 - 15  CBC  Result Value Ref Range   WBC 8.4 4.0 - 10.5 K/uL   RBC 4.42 3.87 - 5.11 MIL/uL   Hemoglobin 14.0 12.0 - 15.0 g/dL   HCT 64.4 03.4 - 74.2 %   MCV 99.5 80.0 - 100.0 fL   MCH 31.7 26.0 - 34.0 pg   MCHC 31.8 30.0 - 36.0 g/dL   RDW 59.5 63.8 - 75.6 %   Platelets 236 150 - 400 K/uL   nRBC 0.0 0.0 - 0.2 %  HIV antibody (Routine Testing)  Result Value Ref Range   HIV Screen 4th Generation wRfx Non Reactive Non Reactive  CBC  Result Value Ref Range   WBC 9.6 4.0 - 10.5 K/uL   RBC 4.09 3.87 - 5.11 MIL/uL   Hemoglobin 12.9 12.0 - 15.0 g/dL   HCT 43.3 29.5 - 18.8 %   MCV 101.0 (H) 80.0 - 100.0 fL   MCH 31.5 26.0 - 34.0 pg  MCHC 31.2 30.0 - 36.0 g/dL   RDW 32.9 51.8 - 84.1 %   Platelets 196 150 - 400 K/uL   nRBC 0.0 0.0 - 0.2 %  Glucose, capillary  Result Value Ref Range   Glucose-Capillary 131 (H) 70 - 99 mg/dL  Glucose, capillary  Result Value Ref Range   Glucose-Capillary 121 (H) 70 - 99 mg/dL  Glucose, capillary  Result Value Ref Range   Glucose-Capillary 120 (H) 70 - 99 mg/dL  Glucose, capillary  Result Value Ref Range   Glucose-Capillary 236 (H) 70 - 99 mg/dL  Glucose, capillary  Result Value Ref Range   Glucose-Capillary 105 (H) 70 - 99 mg/dL  Glucose, capillary  Result Value Ref Range   Glucose-Capillary 90 70 - 99 mg/dL  Glucose, capillary  Result Value Ref Range   Glucose-Capillary 124 (H) 70 - 99 mg/dL  Glucose, capillary  Result Value Ref Range   Glucose-Capillary 119 (H) 70 - 99 mg/dL  Glucose, capillary  Result Value Ref Range   Glucose-Capillary 125 (H) 70 - 99 mg/dL  I-Stat beta hCG blood, ED  Result Value Ref Range   I-stat hCG, quantitative <5.0 <5 mIU/mL   Comment 3            Dg Cholangiogram  Operative  Result Date: 12/05/2018 CLINICAL DATA:  Cholelithiasis, intraoperative cholangiogram EXAM: INTRAOPERATIVE CHOLANGIOGRAM TECHNIQUE: Cholangiographic images from the C-arm fluoroscopic device were submitted for interpretation post-operatively. Please see the procedural report for the amount of contrast and the fluoroscopy time utilized. COMPARISON:  12/04/2018 FINDINGS: Intraoperative cholangiogram performed during the laparoscopic procedure. The residual cystic duct, biliary confluence, common hepatic duct, and common bile duct appear patent. Contrast flows easily into the duodenum. No dilatation, obstruction, severe stricture, or large filling defect. IMPRESSION: Patent biliary system. Electronically Signed   By: Judie Petit.  Shick M.D.   On: 12/05/2018 09:17   US Abdomen Limited Ruq  Result Date: 12/04/2018 CLINICAL DATA:  Right upper quadrant pain. EXAM: ULTRASOUND ABDOMEN LIMITED RIGHT UPPER QUADRANT COMPARISON:  May 06, 2011 FINDINGS: Gallbladder: The gallbladder is dilated with a positive Murphy's sign and a prominent 3.3 cm stone in the neck. No wall thickening or pericholecystic fluid. Common bile duct: Diameter: 3.6 mm Liver: An oval region of decreased echogenicity seen adjacent to the gallbladder measuring 4.8 x 0.7 x 3.2 cm, likely focal fatty sparing. No other masses. Diffuse increased echogenicity in the liver. Portal vein is patent on color Doppler imaging with normal direction of blood flow towards the liver. IMPRESSION: 1. There is a 3.3 cm stone in the neck of the gallbladder with a positive Murphy's sign but no wall thickening or pericholecystic fluid. Acute cholecystitis not excluded. If there is continued clinical ambiguity, recommend a HIDA scan. 2. Probable focal fatty sparing adjacent to the gallbladder fossa. Probable hepatic steatosis. Electronically Signed   By: Gerome Sam III M.D   On: 12/04/2018 10:54    Discharge Exam:  Vitals:   12/06/18 0145 12/06/18 0431  BP:  116/76 124/79  Pulse: 60 84  Resp: 18 16  Temp: 97.7 F (36.5 C) 98.1 F (36.7 C)  SpO2: 95% 97%    General: Obese AA F who is alert and generally healthy appearing.  Lungs: Clear to auscultation and symmetric breath sounds. Heart:  RRR. No murmur or rub. Abdomen: Soft. No mass. Her incisions look good.  Discharge Medications:   Allergies as of 12/06/2018      Reactions   Lisinopril Swelling  Medication List    TAKE these medications   carvedilol 6.25 MG tablet Commonly known as: COREG Take 1 tablet (6.25 mg total) by mouth 2 (two) times daily with a meal.   HYDROcodone-acetaminophen 5-325 MG tablet Commonly known as: NORCO/VICODIN Take 1 tablet by mouth every 6 (six) hours as needed for moderate pain.   losartan-hydrochlorothiazide 50-12.5 MG tablet Commonly known as: HYZAAR Take 1 tablet by mouth daily.   metFORMIN 500 MG tablet Commonly known as: GLUCOPHAGE Take one tablet with lunch and 2 tablets with dinner       Disposition: Discharge disposition: 01-Home or Self Care       Discharge Instructions    Diet - low sodium heart healthy   Complete by: As directed    Increase activity slowly   Complete by: As directed       Follow-up Information    Bjorn Pippin, MD.   Specialty: Orthopedic Surgery Contact information: [address] [place] [address] [phone]            Signed: Ovidio Kin, M.D., [facility] Office:  [phone]  12/06/2018, 9:55 AM

## 2018-12-06 NOTE — Discharge Instructions (Signed)
[facility] - DISCHARGE INSTRUCTIONS TO PATIENT  Return to work on:  12/14/2018  Activity:  Driving - May drive in 2 to 4 days, if doing well and off pain meds   Lifting - No lifting more than 15 pounds for 1 week  Wound Care:   Leave the incision dry until tomorrow, then you may shower  Diet:  As tolerated  Follow up appointment:  Call Dr. Pollie Friar office [facility]) at [phone] for an appointment in 2 to 3 weeks.         We are doing "e" visits post op during this Covid-19 virus epidemic, our office will contact you about this arrangement.  If you have not heard from our office, call our office the day before your scheduled visit to make plans for your visit.  Medications and dosages:  Resume your home medications.  You have a prescription for:  Vicodin  Call Dr. Lucia Gaskins or his office  [phone]) if you have:  Temperature greater than 100.4,  Persistent nausea and vomiting,  Severe uncontrolled pain,  Redness, tenderness, or signs of infection (pain, swelling, redness, odor or green/yellow discharge around the site),  Difficulty breathing, headache or visual disturbances,  Any other questions or concerns you may have after discharge.  In an emergency, call 911 or go to an Emergency Department at a nearby hospital.

## 2018-12-06 NOTE — Progress Notes (Signed)
Nurse reviewed discharge instructions with pt.  Pt verbalized understanding of discharge instructions, follow up appointments and new medication.

## 2018-12-17 ENCOUNTER — Ambulatory Visit: Payer: Self-pay | Admitting: Internal Medicine

## 2019-03-30 ENCOUNTER — Other Ambulatory Visit: Payer: Self-pay | Admitting: Family Medicine

## 2019-03-30 DIAGNOSIS — E1165 Type 2 diabetes mellitus with hyperglycemia: Secondary | ICD-10-CM

## 2019-04-05 ENCOUNTER — Ambulatory Visit: Payer: Self-pay

## 2019-04-09 ENCOUNTER — Other Ambulatory Visit: Payer: Self-pay

## 2019-04-09 ENCOUNTER — Encounter: Payer: Self-pay | Admitting: Family Medicine

## 2019-04-09 ENCOUNTER — Ambulatory Visit: Payer: 59 | Admitting: Family Medicine

## 2019-04-09 VITALS — BP 124/79 | HR 68 | Temp 98.0°F | Resp 16 | Wt 304.0 lb

## 2019-04-09 DIAGNOSIS — I10 Essential (primary) hypertension: Secondary | ICD-10-CM | POA: Diagnosis not present

## 2019-04-09 DIAGNOSIS — Z23 Encounter for immunization: Secondary | ICD-10-CM | POA: Diagnosis not present

## 2019-04-09 DIAGNOSIS — E1165 Type 2 diabetes mellitus with hyperglycemia: Secondary | ICD-10-CM

## 2019-04-09 MED ORDER — LOSARTAN POTASSIUM-HCTZ 50-12.5 MG PO TABS: 1.0000 | ORAL_TABLET | Freq: Every day | ORAL | 3 refills | Status: DC

## 2019-04-09 NOTE — Patient Instructions (Signed)
° ° ° °  If you have lab work done today you will be contacted with your lab results within the next 2 weeks.  If you have not heard from us then please contact us. The fastest way to get your results is to register for My Chart. ° ° °IF you received an x-ray today, you will receive an invoice from [facility]. Please contact  Radiology at [phone] with questions or concerns regarding your invoice.  ° °IF you received labwork today, you will receive an invoice from [facility]. Please contact [facility] at [phone] with questions or concerns regarding your invoice.  ° °Our billing staff will not be able to assist you with questions regarding bills from these companies. ° °You will be contacted with the lab results as soon as they are available. The fastest way to get your results is to activate your My Chart account. Instructions are located on the last page of this paperwork. If you have not heard from us regarding the results in 2 weeks, please contact this office. °  ° ° ° °

## 2019-04-09 NOTE — Progress Notes (Signed)
Established Patient Office Visit  Subjective:  Patient ID: Doris Armstrong, female    DOB: Jun 24, 1976  Age: 43 y.o. MRN: [id number]  CC:  Chief Complaint  Patient presents with  . Hypertension    4 month follow-up   . Diabetes    HPI MICHAYA KHALSA presents for   Hypertension: Patient here for follow-up of elevated blood pressure. She is exercising and is adherent to low salt diet.  Blood pressure is well controlled at home. Cardiac symptoms none. Patient denies chest pain, chest pressure/discomfort, exertional chest pressure/discomfort, irregular heart beat, lower extremity edema and orthopnea.  Cardiovascular risk factors: diabetes mellitus, dyslipidemia and hypertension. Use of agents associated with hypertension: none. History of target organ damage: none. BP Readings from Last 3 Encounters:  04/09/19 124/79  12/06/18 124/79  06/04/18 129/80   Diabetes Mellitus: Patient presents for follow up of diabetes. Symptoms: none. Symptoms have stabilized. Patient denies foot ulcerations, hypoglycemia , increase appetite, paresthesia of the feet, polydipsia and polyuria.  Evaluation to date has been included: hemoglobin A1C.  Home sugars: patient does not check sugars.  Lab Results  Component Value Date   HGBA1C 6.8 (H) 04/09/2019   Lab Results  Component Value Date   HGBA1C 6.8 (H) 04/09/2019    She is s/p lap chole No diarrhea or nausea    Past Medical History:  Diagnosis Date  . Diabetes mellitus   . Diabetes mellitus without complication (HCC)   . Gall bladder stones   . Hypertension   . PCOS (polycystic ovarian syndrome)     Past Surgical History:  Procedure Laterality Date  . CHOLECYSTECTOMY N/A 12/05/2018   Procedure: LAPAROSCOPIC CHOLECYSTECTOMY WITH INTRAOPERATIVE CHOLANGIOGRAM;  Surgeon: Ovidio Kin, MD;  Location: [facility];  Service: General;  Laterality: N/A;    History reviewed. No pertinent family history.  Social History   Socioeconomic History  .  Marital status: Single    Spouse name: Not on file  . Number of children: 0  . Years of education: Not on file  . Highest education level: Not on file  Occupational History  . Not on file  Social Needs  . Financial resource strain: Not on file  . Food insecurity    Worry: Not on file    Inability: Not on file  . Transportation needs    Medical: Not on file    Non-medical: Not on file  Tobacco Use  . Smoking status: Former Games developer  . Smokeless tobacco: Never Used  Substance and Sexual Activity  . Alcohol use: Yes  . Drug use: No  . Sexual activity: Yes  Lifestyle  . Physical activity    Days per week: Not on file    Minutes per session: Not on file  . Stress: Not on file  Relationships  . Social Musician on phone: Not on file    Gets together: Not on file    Attends religious service: Not on file    Active member of club or organization: Not on file    Attends meetings of clubs or organizations: Not on file    Relationship status: Not on file  . Intimate partner violence    Fear of current or ex partner: Not on file    Emotionally abused: Not on file    Physically abused: Not on file    Forced sexual activity: Not on file  Other Topics Concern  . Not on file  Social History Narrative   **  Merged History Encounter **        Outpatient Medications Prior to Visit  Medication Sig Dispense Refill  . metFORMIN (GLUCOPHAGE) 500 MG tablet Take one tablet with lunch and 2 tablets with dinner 180 tablet 3  . HYDROcodone-acetaminophen (NORCO/VICODIN) 5-325 MG tablet Take 1 tablet by mouth every 6 (six) hours as needed for moderate pain. 12 tablet 0  . losartan-hydrochlorothiazide (HYZAAR) 50-12.5 MG tablet Take 1 tablet by mouth daily. 90 tablet 3  . carvedilol (COREG) 6.25 MG tablet Take 1 tablet (6.25 mg total) by mouth 2 (two) times daily with a meal. (Patient not taking: Reported on 04/09/2019) 60 tablet 6   No facility-administered medications prior to visit.      Allergies  Allergen Reactions  . Lisinopril Swelling    ROS Review of Systems Review of Systems  Constitutional: Negative for activity change, appetite change, chills and fever.  HENT: Negative for congestion, nosebleeds, trouble swallowing and voice change.   Respiratory: Negative for cough, shortness of breath and wheezing.   Gastrointestinal: Negative for diarrhea, nausea and vomiting.  Genitourinary: Negative for difficulty urinating, dysuria, flank pain and hematuria.  Musculoskeletal: Negative for back pain, joint swelling and neck pain.  Neurological: Negative for dizziness, speech difficulty, light-headedness and numbness.  See HPI. All other review of systems negative.     Objective:    Physical Exam  BP 124/79   Pulse 68   Temp 98 F (36.7 C) (Oral)   Resp 16   Wt (!) 304 lb (137.9 kg)   LMP 03/27/2019 (Approximate)   SpO2 96%   BMI 41.23 kg/m  Wt Readings from Last 3 Encounters:  04/09/19 (!) 304 lb (137.9 kg)  12/04/18 296 lb 15.4 oz (134.7 kg)  06/04/18 296 lb (134.3 kg)   Physical Exam  Constitutional: Oriented to person, place, and time. Appears well-developed and well-nourished.  HENT:  Head: Normocephalic and atraumatic.  Eyes: Conjunctivae and EOM are normal.  Cardiovascular: Normal rate, regular rhythm, normal heart sounds and intact distal pulses.  No murmur heard. Pulmonary/Chest: Effort normal and breath sounds normal. No stridor. No respiratory distress. Has no wheezes.  Neurological: Is alert and oriented to person, place, and time.  Skin: Skin is warm. Capillary refill takes less than 2 seconds.  Psychiatric: Has a normal mood and affect. Behavior is normal. Judgment and thought content normal.    Health Maintenance Due  Topic Date Due  . PNEUMOCOCCAL POLYSACCHARIDE VACCINE AGE 22-64 HIGH RISK  02/02/1977  . FOOT EXAM  02/02/1985  . OPHTHALMOLOGY EXAM  02/02/1985  . TETANUS/TDAP  02/02/1994  . PAP SMEAR-Modifier  02/03/1996     There are no preventive care reminders to display for this patient.  Lab Results  Component Value Date   TSH 2.740 11/30/2018   Lab Results  Component Value Date   WBC 9.6 12/05/2018   HGB 12.9 12/05/2018   HCT 41.3 12/05/2018   MCV 101.0 (H) 12/05/2018   PLT 196 12/05/2018   Lab Results  Component Value Date   NA 140 04/09/2019   K 4.5 04/09/2019   CO2 19 (L) 04/09/2019   GLUCOSE 100 (H) 04/09/2019   BUN 19 04/09/2019   CREATININE 0.89 04/09/2019   BILITOT 0.3 04/09/2019   ALKPHOS 59 04/09/2019   AST 33 04/09/2019   ALT 44 (H) 04/09/2019   PROT 7.5 04/09/2019   ALBUMIN 4.7 04/09/2019   CALCIUM 9.9 04/09/2019   ANIONGAP 7 12/04/2018   Lab Results  Component Value Date  CHOL 132 04/09/2019   Lab Results  Component Value Date   HDL 54 04/09/2019   Lab Results  Component Value Date   LDLCALC 67 04/09/2019   Lab Results  Component Value Date   TRIG 50 04/09/2019   Lab Results  Component Value Date   CHOLHDL 2.4 04/09/2019   Lab Results  Component Value Date   HGBA1C 6.8 (H) 04/09/2019      Assessment & Plan:   Problem List Items Addressed This Visit      Cardiovascular and Mediastinum   Essential hypertension - Patient's blood pressure is at goal of 139/89 or less. Condition is stable. Continue current medications and treatment plan. I recommend that you exercise for 30-45 minutes 5 days a week. I also recommend a balanced diet with fruits and vegetables every day, lean meats, and little fried foods. The DASH diet (you can find this online) is a good example of this.    Relevant Medications   losartan-hydrochlorothiazide (HYZAAR) 50-12.5 MG tablet   Other Relevant Orders   Comprehensive metabolic panel (Completed)    Other Visit Diagnoses    Need for prophylactic vaccination and inoculation against influenza    -  Primary   Relevant Orders   Flu Vaccine QUAD 36+ mos IM (Completed)   Type 2 diabetes mellitus with hyperglycemia, without long-term  current use of insulin (HCC)    -   well controlled hemoglobin a1c is at goal Continue exercise Emphasized importance of eye and dental exam       Relevant Medications   losartan-hydrochlorothiazide (HYZAAR) 50-12.5 MG tablet   Other Relevant Orders   Comprehensive metabolic panel (Completed)   Hemoglobin A1c (Completed)   Ambulatory referral to diabetic education   Lipid panel (Completed)      Meds ordered this encounter  Medications  . losartan-hydrochlorothiazide (HYZAAR) 50-12.5 MG tablet    Sig: Take 1 tablet by mouth daily.    Dispense:  90 tablet    Refill:  3    Follow-up: Return in about 3 months (around 07/10/2019) for diabetes follow up and weight check.    Doristine Bosworth, MD

## 2019-04-10 LAB — COMPREHENSIVE METABOLIC PANEL
ALT: 44 IU/L — ABNORMAL HIGH (ref 0–32)
AST: 33 IU/L (ref 0–40)
Albumin/Globulin Ratio: 1.7 (ref 1.2–2.2)
Albumin: 4.7 g/dL (ref 3.8–4.8)
Alkaline Phosphatase: 59 IU/L (ref 39–117)
BUN/Creatinine Ratio: 21 (ref 9–23)
BUN: 19 mg/dL (ref 6–24)
Bilirubin Total: 0.3 mg/dL (ref 0.0–1.2)
CO2: 19 mmol/L — ABNORMAL LOW (ref 20–29)
Calcium: 9.9 mg/dL (ref 8.7–10.2)
Chloride: 104 mmol/L (ref 96–106)
Creatinine, Ser: 0.89 mg/dL (ref 0.57–1.00)
GFR calc Af Amer: 91 mL/min/{1.73_m2} (ref >59)
GFR calc non Af Amer: 79 mL/min/{1.73_m2} (ref >59)
Globulin, Total: 2.8 g/dL (ref 1.5–4.5)
Glucose: 100 mg/dL — ABNORMAL HIGH (ref 65–99)
Potassium: 4.5 mmol/L (ref 3.5–5.2)
Sodium: 140 mmol/L (ref 134–144)
Total Protein: 7.5 g/dL (ref 6.0–8.5)

## 2019-04-10 LAB — LIPID PANEL
Chol/HDL Ratio: 2.4 ratio (ref 0.0–4.4)
Cholesterol, Total: 132 mg/dL (ref 100–199)
HDL: 54 mg/dL (ref >39)
LDL Chol Calc (NIH): 67 mg/dL (ref 0–99)
Triglycerides: 50 mg/dL (ref 0–149)
VLDL Cholesterol Cal: 11 mg/dL (ref 5–40)

## 2019-04-10 LAB — HEMOGLOBIN A1C
Est. average glucose Bld gHb Est-mCnc: 148 mg/dL
Hgb A1c MFr Bld: 6.8 % — ABNORMAL HIGH (ref 4.8–5.6)

## 2019-05-14 ENCOUNTER — Encounter: Payer: 59 | Attending: Family Medicine | Admitting: *Deleted

## 2019-05-14 ENCOUNTER — Other Ambulatory Visit: Payer: Self-pay

## 2019-05-14 DIAGNOSIS — E119 Type 2 diabetes mellitus without complications: Secondary | ICD-10-CM | POA: Insufficient documentation

## 2019-05-14 NOTE — Progress Notes (Signed)
Diabetes Self-Management Education  Visit Type: First/Initial  Appt. Start Time: 1100 Appt. End Time: 1230  05/14/2019  Ms. Doris Armstrong, identified by name and date of birth, is a 43 y.o. female with a diagnosis of Diabetes: Type 2. 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 She has a 32 year old son at home. She states when she had a different route at work that involved more walking, she lost about 60 pounds over a years time. The current route involves less walking and she has gained the weight back. She also volunteered that she drinks wine every night to relax, typically one to two bottles.   ASSESSMENT  There were no vitals taken for this visit. There is no height or weight on file to calculate BMI.  Diabetes Self-Management Education - 05/14/19 1129      Visit Information   Visit Type  First/Initial      Initial Visit   Diabetes Type  Type 2    Are you currently following a meal plan?  Yes    What type of meal plan do you follow?  high protein and low carb    Are you taking your medications as prescribed?  Yes      Health Coping   How would you rate your overall health?  Fair      Psychosocial Assessment   Patient Belief/Attitude about Diabetes  Denial    Self-care barriers  None    Other persons present  Patient    Patient Concerns  Nutrition/Meal planning;Weight Control;Problem Solving    Special Needs  None    Preferred Learning Style  Auditory;Visual;Hands on    [employer]    How often do you need to have someone help you when you read instructions, pamphlets, or other written materials from your doctor or pharmacy?  1 - Never    What is the last grade level you completed in school?  BA      Pre-Education Assessment   Patient understands the diabetes disease and treatment process.  Needs Review    Patient understands incorporating nutritional management  into lifestyle.  Needs Instruction    Patient undertands incorporating physical activity into lifestyle.  Needs Review    Patient understands using medications safely.  Needs Instruction    Patient understands monitoring blood glucose, interpreting and using results  Needs Instruction    Patient understands prevention, detection, and treatment of acute complications.  Needs Instruction    Patient understands prevention, detection, and treatment of chronic complications.  Needs Instruction    Patient understands how to develop strategies to address psychosocial issues.  Needs Instruction    Patient understands how to develop strategies to promote health/change behavior.  Needs Instruction      Complications   Last HgB A1C per patient/outside source  6.5 %    How often do you check your blood sugar?  3-4 times / week    Fasting Blood glucose range (mg/dL)  70-129    Postprandial Blood glucose range (mg/dL)  70-129    Number of hypoglycemic episodes per month  0    Have you had a dilated eye exam in the past 12 months?  Yes    Have you had a dental exam in the past 12 months?  Yes    Are you checking your feet?  Yes    How many days per week are  you checking your feet?  5      Dietary Intake   Breakfast  skips most days. Atkins Bar and 30 g. protein   On day off: eggs, or [place] [employer]  brings Premier protein drink, nuts and cheese snack pack OR Atkins [employer]  she cooks - quick meal of tilapia with cheese OR [place] burger x 2 along with salad or frozen mixed vegetables    Snack (evening)  on weekends - sandwich, cereal with milk or something else if hungry    Beverage(s)  water,wine throughout the night,      Exercise   Exercise Type  Light (walking / raking leaves)    How many days per week to you exercise?  5    How many minutes per day do you exercise?  45    Total minutes per week of exercise  225      Individualized Goals (developed by patient)   Nutrition  Follow  meal plan discussed    Physical Activity  Exercise 3-5 times per week    Medications  take my medication as prescribed    Monitoring   test blood glucose pre and post meals as discussed      Post-Education Assessment   Patient understands the diabetes disease and treatment process.  Demonstrates understanding / competency    Patient understands incorporating nutritional management into lifestyle.  Demonstrates understanding / competency    Patient undertands incorporating physical activity into lifestyle.  Demonstrates understanding / competency    Patient understands using medications safely.  Demonstrates understanding / competency    Patient understands monitoring blood glucose, interpreting and using results  Demonstrates understanding / competency    Patient understands prevention, detection, and treatment of acute complications.  Demonstrates understanding / competency    Patient understands prevention, detection, and treatment of chronic complications.  Demonstrates understanding / competency    Patient understands how to develop strategies to address psychosocial issues.  Demonstrates understanding / competency    Patient understands how to develop strategies to promote health/change behavior.  Demonstrates understanding / competency      Outcomes   Expected Outcomes  Demonstrated interest in learning. Expect positive outcomes    Future DMSE  PRN    Program Status  Not Completed       Individualized Plan for Diabetes Self-Management Training:   Learning Objective:  Patient will have a greater understanding of diabetes self-management. Patient education plan is to attend individual and/or group sessions per assessed needs and concerns.   Plan:   Patient Instructions  Plan:  Aim for 3 Carb Choices per meal (45 grams) +/- 1 either way  Aim for 0-2 Carbs per snack if hungry  Include protein in moderation with your meals and snacks Consider  increasing your activity level by  walking, biking, or any  other fun activity daily as tolerated Continue checking BG at alternate times per day  Consider taking medication as directed by MD Consider ways to decrease your alcohol intake   Expected Outcomes:  Demonstrated interest in learning. Expect positive outcomes  Education material provided: A1C conversion sheet, Meal plan card and Carbohydrate counting sheet  If problems or questions, patient to contact team via:  Phone  Future DSME appointment: PRN

## 2019-05-14 NOTE — Patient Instructions (Signed)
Plan:  Aim for 3 Carb Choices per meal (45 grams) +/- 1 either way  Aim for 0-2 Carbs per snack if hungry  Include protein in moderation with your meals and snacks Consider  increasing your activity level by walking, biking, or any  other fun activity daily as tolerated Continue checking BG at alternate times per day  Consider taking medication as directed by MD

## 2019-06-16 ENCOUNTER — Other Ambulatory Visit: Payer: Self-pay | Admitting: Family Medicine

## 2019-06-16 MED ORDER — LOSARTAN POTASSIUM-HCTZ 50-12.5 MG PO TABS: 1.0000 | ORAL_TABLET | Freq: Every day | ORAL | 1 refills | Status: DC

## 2019-06-16 MED ORDER — METFORMIN HCL 500 MG PO TABS: ORAL_TABLET | ORAL | 1 refills | Status: DC

## 2019-06-16 NOTE — Telephone Encounter (Signed)
Medication Refill - Medication: losartan-hydrochlorothiazide (HYZAAR) 50-12.5 MG tablet metFORMIN (GLUCOPHAGE) 500 MG tablet    Preferred Pharmacy (with phone number or street name):  [place], [place] [facility] Phone:  [phone]  Fax:  [phone]       Agent: Please be advised that RX refills may take up to 3 business days. We ask that you follow-up with your pharmacy.

## 2019-06-24 ENCOUNTER — Other Ambulatory Visit: Payer: Self-pay | Admitting: Obstetrics & Gynecology

## 2019-06-24 DIAGNOSIS — N838 Other noninflammatory disorders of ovary, fallopian tube and broad ligament: Secondary | ICD-10-CM

## 2019-07-09 ENCOUNTER — Other Ambulatory Visit: Payer: 59

## 2019-07-09 ENCOUNTER — Ambulatory Visit: Payer: 59 | Admitting: Family Medicine

## 2019-07-16 ENCOUNTER — Ambulatory Visit
Admission: RE | Admit: 2019-07-16 | Discharge: 2019-07-16 | Disposition: A | Discharge status: Home / Self Care | Payer: 59 | Source: Ambulatory Visit | Attending: Obstetrics & Gynecology | Admitting: Obstetrics & Gynecology

## 2019-07-16 ENCOUNTER — Encounter: Payer: Self-pay | Admitting: Internal Medicine

## 2019-07-16 DIAGNOSIS — N838 Other noninflammatory disorders of ovary, fallopian tube and broad ligament: Secondary | ICD-10-CM

## 2019-07-16 MED ORDER — IOPAMIDOL (ISOVUE-300) INJECTION 61%
125.0000 mL | Freq: Once | INTRAVENOUS | Status: AC | PRN
  Administered 2019-07-16: 125 mL via INTRAVENOUS

## 2019-07-21 ENCOUNTER — Telehealth: Payer: Self-pay | Admitting: *Deleted

## 2019-07-21 NOTE — Telephone Encounter (Signed)
Spoke with the patient, scheduled a new patient appt for 1/29. Gave the patient the address and directions to office. Also gave policy for masks, visitors and parking. Explained that she will have a pelvic exam

## 2019-07-22 NOTE — H&P (View-Only) (Signed)
GYNECOLOGIC ONCOLOGY NEW PATIENT CONSULTATION   Patient Name: Doris Armstrong  Patient Age: 44 y.o. Date of Service: 07/23/19 Referring Provider: Forrest Moron, MD [place],  [place] [id number]   Primary Care Provider: Forrest Moron, MD Consulting Provider: Jeral Pinch, MD   Assessment/Plan:  Premenopausal patient with long history of abnormal uterine bleeding and PCOS now presenting with incidentally found complex pelvic mass.  Discussed findings on CT scan that raise concern for possible malignancy including complex nature of the mass as well as mildly enlarged para-aortic lymph node.  Overall, my suspicion is that this is a benign mass; however, for diagnostic purposes, I recommend proceeding with surgery.  Given the patient's abnormal uterine bleeding and plan for surgery, I recommended endometrial biopsy today.  She asked about concurrent hysterectomy at the time of unilateral oophorectomy and bilateral salpingectomy.  We discussed that plan will be for intraoperative frozen section and if pathology appears to be borderline or malignant, then I would plan for removal of the contralateral ovary, total hysterectomy and staging (only if malignancy suspected).  If no borderline tumor or malignancy encountered, as long as the other ovary looked normal, I would recommend leaving it in situ.  We discussed that all cause mortality is elevated in women without a genetic predisposition to ovarian cancer if ovaries are removed.  Specifically we discussed the protective effects on cardiac health, bone health and brain health.  If the patient's endometrial biopsy shows precancer or cancer, then we would proceed with total hysterectomy regardless of the pathology findings at the time of surgery.  The patient has a history of low-grade cervical dysplasia.  From her report as well as records received, does not seem like she has had a follow-up cotest since her CIN-1 biopsy at the end of  2019.  Pap test and HPV testing performed today.  Surgery is tentatively scheduled on February 11 for robotic assisted unilateral oophorectomy, bilateral salpingectomy, possible contralateral oophorectomy, possible total hysterectomy, possible staging, possible laparotomy.  The patient will call us if she needs to change the date of surgery secondary to work. The risks of surgery were discussed in detail and she understands these to include infection; wound separation; hernia; vaginal cuff separation, injury to adjacent organs such as bowel, bladder, blood vessels, ureters and nerves; bleeding which may require blood transfusion; anesthesia risk; thromboembolic events; possible death; unforeseen complications; possible need for re-exploration; medical complications such as heart attack, stroke, pleural effusion and pneumonia; and, if full lymphadenectomy is performed the risk of lymphedema and lymphocyst. The patient will receive DVT and antibiotic prophylaxis as indicated. She voiced a clear understanding. She had the opportunity to ask questions. Perioperative instructions were reviewed with her. Prescriptions for post-op medications were sent to her pharmacy of choice.  A copy of this note was sent to the patient's referring provider.   50 minutes of total time was spent for this patient encounter, including preparation, face-to-face counseling with the patient and coordination of care, and documentation of the encounter.  Jeral Pinch, MD  Division of Gynecologic Oncology  Department of Obstetrics and Gynecology  [facility]  ___________________________________________  Chief Complaint: Chief Complaint  Patient presents with  . Pelvic mass in female    History of Present Illness:  Doris Armstrong is a 44 y.o. y.o. female who is seen in consultation at the request of Forrest Moron, MD for an evaluation of an adnexal mass.  The patient was seen at  [facility] and infertility secondary to a history of abnormal uterine bleeding and PCOS.  She underwent ultrasound in the clinic showing a normal uterus but enlarged left ovary with an echogenic area measuring 4.5 x 3.8 x 3.7 cm and 2 smaller echogenic areas measuring up to 1.1 cm.  Right ovary with audible follicles. CA-125 was 16.2, CEA was 2.6 and AFP was less than 0.9, all within normal limits.  CT scan showed complex cystic mass measuring up to 7 cm.  Additionally, there was a newly enlarged left para-aortic lymph node.  Patient overall has been feeling well.  She denies any abdominal pain or pelvic pressure.  She endorses a good appetite without nausea or vomiting.  She denies any urinary or bowel symptoms.  She is able to ambulate without chest pain or shortness of breath.  Her history is notable for diabetes.  She does not check her sugars as frequently as she should but notes that the highest they have run in the last several weeks is 142.  Typically they are in the 100s to 120s.  She has a longstanding history of abnormal uterine bleeding which more recently has become spotting for longer periods of time.  Her history is also notable for PCOS and likely anovulatory bleeding.  She achieved 2 pregnancies with the use of IVF, both which ended in miscarriages at 1 month and 4 months.  She has embryos left but does not plan to attempt further pregnancy.  She now has adopted 2 children, one by herself and one with a partner.  Her kids are 5 and 78.   PAST MEDICAL HISTORY:  Past Medical History:  Diagnosis Date  . Adnexal mass   . Diabetes mellitus   . Diabetes mellitus without complication ([place])   . Gall bladder stones   . History of infection due to human papilloma virus (HPV)   . Hypertension   . Obesity   . PCOS (polycystic ovarian syndrome)   . Personal history of cervical dysplasia      PAST SURGICAL HISTORY:  Past Surgical History:  Procedure Laterality Date  . CHOLECYSTECTOMY N/A  12/05/2018   Procedure: LAPAROSCOPIC CHOLECYSTECTOMY WITH INTRAOPERATIVE CHOLANGIOGRAM;  Surgeon: Alphonsa Overall, MD;  Location: [facility];  Service: General;  Laterality: N/A;  . KNEE ARTHROSCOPY Right 1993    OB/GYN HISTORY:  OB History  Gravida Para Term Preterm AB Living  2 1     1  0  SAB TAB Ectopic Multiple Live Births               # Outcome Date GA Lbr Len/2nd Weight Sex Delivery Anes PTL Lv  2 AB           1 Para             Obstetric Comments  Adopted 1 child    Patient's last menstrual period was 06/28/2019 (approximate).  Age at menarche: 55 Age at menopause: N/A Hx of HRT: Denies Hx of STDs: HPV Last pap: 04/2018, CIN1 and HPV History of abnormal pap smears: yes  SCREENING STUDIES:  Last mammogram: 03/2018  Last colonoscopy: n/a  MEDICATIONS: Outpatient Encounter Medications as of 07/23/2019  Medication Sig  . losartan-hydrochlorothiazide (HYZAAR) 50-12.5 MG tablet Take 1 tablet by mouth daily.  . metFORMIN (GLUCOPHAGE) 500 MG tablet Take one tablet with lunch and 2 tablets with dinner  . ibuprofen (ADVIL) 800 MG tablet Take 1 tablet (800 mg total) by mouth every 8 (eight) hours as needed. For AFTER  surgery only  . medroxyPROGESTERone (PROVERA) 10 MG tablet Take 10 mg by mouth daily.  Marland Kitchen oxyCODONE (OXY IR/ROXICODONE) 5 MG immediate release tablet Take 1 tablet (5 mg total) by mouth every 4 (four) hours as needed for severe pain. For AFTER surgery, do not take and drive  . senna-docusate (SENOKOT-S) 8.6-50 MG tablet Take 2 tablets by mouth at bedtime. For AFTER surgery, do not take if having diarrhea   No facility-administered encounter medications on file as of 07/23/2019.    ALLERGIES:  Allergies  Allergen Reactions  . Lisinopril Swelling     FAMILY HISTORY:  Family History  Problem Relation Age of Onset  . Breast cancer Paternal Grandmother   . Lung cancer Paternal Grandfather   . Ovarian cancer Neg Hx   . Endometrial cancer Neg Hx      SOCIAL  HISTORY:    Social Connections:   . Frequency of Communication with Friends and Family: Not on file  . Frequency of Social Gatherings with Friends and Family: Not on file  . Attends Religious Services: Not on file  . Active Member of Clubs or Organizations: Not on file  . Attends Archivist Meetings: Not on file  . Marital Status: Not on file    REVIEW OF SYSTEMS:  Denies appetite changes, fevers, chills, fatigue, unexplained weight changes. Denies hearing loss, neck lumps or masses, mouth sores, ringing in ears or voice changes. Denies cough or wheezing.  Denies shortness of breath. Denies chest pain or palpitations. Denies leg swelling. Denies abdominal distention, pain, blood in stools, constipation, diarrhea, nausea, vomiting, or early satiety. Denies pain with intercourse, dysuria, frequency, hematuria or incontinence. Denies hot flashes, pelvic pain, vaginal bleeding or vaginal discharge.   Denies joint pain, back pain or muscle pain/cramps. Denies itching, rash, or wounds. Denies dizziness, headaches, numbness or seizures. Denies swollen lymph nodes or glands, denies easy bruising or bleeding. Denies anxiety, depression, confusion, or decreased concentration.  Physical Exam:  Vital Signs for this encounter:  Blood pressure 127/83, pulse 71, temperature 98.2 F (36.8 C), temperature source Temporal, resp. rate 18, height 6' (1.829 m), weight (!) 307 lb (139.3 kg), last menstrual period 06/28/2019, SpO2 100 %. Body mass index is 41.64 kg/m. General: Alert, oriented, no acute distress.  HEENT: Normocephalic, atraumatic. Sclera anicteric.  Chest: Clear to auscultation bilaterally. No wheezes, rhonchi, or rales. Cardiovascular: Regular rate and rhythm, no murmurs, rubs, or gallops.  Abdomen: Obese. Normoactive bowel sounds. Soft, nondistended, nontender to palpation. No masses or hepatosplenomegaly appreciated. No palpable fluid wave. Well healed laparoscopic  incisions. Extremities: Grossly normal range of motion. Warm, well perfused. No edema bilaterally.  Skin: No rashes or lesions.  Lymphatics: No cervical, supraclavicular, or inguinal adenopathy.  GU:  Normal external female genitalia.  No lesions. No discharge or bleeding.             Bladder/urethra:  No lesions or masses, well supported bladder             Vagina: Well rugated vaginal mucosa.  No lesions.             Cervix: Normal appearing, no lesions.  Cervix visually dilated 1-2 cm.  Pap test and HPV collected.             Uterus: Small, mobile, retroverted, no parametrial involvement or nodularity.             Adnexa: Mass appreciated to move with the uterus, which feels retroverted.  Midline location of the  mass.  Rectal: Deferred.  Endometrial Biopsy  The procedure was explained.  Verbal consent was obtained.  The speculum was placed and the cervix was prepped with betadine.  The endometrial biopsy was performed with  passes of the endometrial biopsy pipelle. Scant tissue was obtained. The uterus sounded to 7-8 cm.  The patient tolerated procedure.    LABORATORY AND RADIOLOGIC DATA:  Outside medical records were reviewed to synthesize the above history, along with the history and physical obtained during the visit.   Lab Results  Component Value Date   WBC 9.6 12/05/2018   HGB 12.9 12/05/2018   HCT 41.3 12/05/2018   PLT 196 12/05/2018   GLUCOSE 100 (H) 04/09/2019   CHOL 132 04/09/2019   TRIG 50 04/09/2019   HDL 54 04/09/2019   LDLCALC 67 04/09/2019   ALT 44 (H) 04/09/2019   AST 33 04/09/2019   NA 140 04/09/2019   K 4.5 04/09/2019   CL 104 04/09/2019   CREATININE 0.89 04/09/2019   BUN 19 04/09/2019   CO2 19 (L) 04/09/2019   TSH 2.740 11/30/2018   HGBA1C 6.8 (H) 04/09/2019   Ct A/P on 1/22: Vascular/Lymphatic: Left periaortic lymph node 1.2 cm in short axis on image 41/2, previously 0.8 cm on 05/27/2010.  Reproductive: 7.0 by 6.1 by 5.7 cm right adnexal mass  demonstrates low density elements as well as enhancing elements, with some of the associated venous drainage probably from the left renal vein which is interesting giving the right eccentricity of the lesion. Difficult to separately identify the left ovary, and although this lesion is eccentric to the right at may be associated with the left ovary. 3.9 by 2.1 cm structure along the posteroinferior margin of this lesion on image 85/2 probably represents right ovarian tissue.  Retroverted uterus.  Other: No appreciable ascites or nodularity along the mesentery or omentum.  Musculoskeletal: Spurring at L5-S1 contributing to considerable bilateral foraminal impingement. There is loss of disc height and endplate sclerosis at this level and posterior intervertebral spurring causes borderline central narrowing of the thecal sac.  IMPRESSION: 1. A likely cystic and solid mass along the right dorsal side of the uterus appears to have venous drainage through the left ovarian vein, and could represent a left ovarian lesion which is simply grown eccentric to the right side, or a right adnexal lesion with tumor vascularity. This lesion abuts the anterior superior margin of the right ovary and measures up to 7.0 cm. The lesion size and internal enhancement favor malignant etiology although some benign cystic and solid ovarian masses could have a similar appearance. Correlation with the prior sonographic appearance is recommended along with correlation with CA-125 level or other tumor markers. Surgical evaluation should be considered. 2. Mildly enlarged left periaortic lymph node at 1.2 cm in short axis (previously 0.8 cm on 05/27/2010). Although nonspecific this does raise the possibility of a malignant lymph node, and should at least be surveilled. 3. Other imaging findings of potential clinical significance: Hepatic steatosis. Chronic right adrenal nodule, likely benign. Spurring causes  notable impingement at L5-S1.

## 2019-07-22 NOTE — Progress Notes (Signed)
GYNECOLOGIC ONCOLOGY NEW PATIENT CONSULTATION   Patient Name: Doris Armstrong  Patient Age: 44 y.o. Date of Service: 07/23/19 Referring Provider: Forrest Moron, MD [place],  [place] [address]   Primary Care Provider: Forrest Moron, MD Consulting Provider: Jeral Pinch, MD   Assessment/Plan:  Premenopausal patient with long history of abnormal uterine bleeding and PCOS now presenting with incidentally found complex pelvic mass.  Discussed findings on CT scan that raise concern for possible malignancy including complex nature of the mass as well as mildly enlarged para-aortic lymph node.  Overall, my suspicion is that this is a benign mass; however, for diagnostic purposes, I recommend proceeding with surgery.  Given the patient's abnormal uterine bleeding and plan for surgery, I recommended endometrial biopsy today.  She asked about concurrent hysterectomy at the time of unilateral oophorectomy and bilateral salpingectomy.  We discussed that plan will be for intraoperative frozen section and if pathology appears to be borderline or malignant, then I would plan for removal of the contralateral ovary, total hysterectomy and staging (only if malignancy suspected).  If no borderline tumor or malignancy encountered, as long as the other ovary looked normal, I would recommend leaving it in situ.  We discussed that all cause mortality is elevated in women without a genetic predisposition to ovarian cancer if ovaries are removed.  Specifically we discussed the protective effects on cardiac health, bone health and brain health.  If the patient's endometrial biopsy shows precancer or cancer, then we would proceed with total hysterectomy regardless of the pathology findings at the time of surgery.  The patient has a history of low-grade cervical dysplasia.  From her report as well as records received, does not seem like she has had a follow-up cotest since her CIN-1 biopsy at the end of  2019.  Pap test and HPV testing performed today.  Surgery is tentatively scheduled on February 11 for robotic assisted unilateral oophorectomy, bilateral salpingectomy, possible contralateral oophorectomy, possible total hysterectomy, possible staging, possible laparotomy.  The patient will call us if she needs to change the date of surgery secondary to work. The risks of surgery were discussed in detail and she understands these to include infection; wound separation; hernia; vaginal cuff separation, injury to adjacent organs such as bowel, bladder, blood vessels, ureters and nerves; bleeding which may require blood transfusion; anesthesia risk; thromboembolic events; possible death; unforeseen complications; possible need for re-exploration; medical complications such as heart attack, stroke, pleural effusion and pneumonia; and, if full lymphadenectomy is performed the risk of lymphedema and lymphocyst. The patient will receive DVT and antibiotic prophylaxis as indicated. She voiced a clear understanding. She had the opportunity to ask questions. Perioperative instructions were reviewed with her. Prescriptions for post-op medications were sent to her pharmacy of choice.  A copy of this note was sent to the patient's referring provider.   50 minutes of total time was spent for this patient encounter, including preparation, face-to-face counseling with the patient and coordination of care, and documentation of the encounter.  Jeral Pinch, MD  Division of Gynecologic Oncology  Department of Obstetrics and Gynecology  [facility]  ___________________________________________  Chief Complaint: Chief Complaint  Patient presents with  . Pelvic mass in female    History of Present Illness:  Doris Armstrong is a 44 y.o. y.o. female who is seen in consultation at the request of Forrest Moron, MD for an evaluation of an adnexal mass.  The patient was seen at  [facility] and infertility secondary to a history of abnormal uterine bleeding and PCOS.  She underwent ultrasound in the clinic showing a normal uterus but enlarged left ovary with an echogenic area measuring 4.5 x 3.8 x 3.7 cm and 2 smaller echogenic areas measuring up to 1.1 cm.  Right ovary with audible follicles. CA-125 was 16.2, CEA was 2.6 and AFP was less than 0.9, all within normal limits.  CT scan showed complex cystic mass measuring up to 7 cm.  Additionally, there was a newly enlarged left para-aortic lymph node.  Patient overall has been feeling well.  She denies any abdominal pain or pelvic pressure.  She endorses a good appetite without nausea or vomiting.  She denies any urinary or bowel symptoms.  She is able to ambulate without chest pain or shortness of breath.  Her history is notable for diabetes.  She does not check her sugars as frequently as she should but notes that the highest they have run in the last several weeks is 142.  Typically they are in the 100s to 120s.  She has a longstanding history of abnormal uterine bleeding which more recently has become spotting for longer periods of time.  Her history is also notable for PCOS and likely anovulatory bleeding.  She achieved 2 pregnancies with the use of IVF, both which ended in miscarriages at 1 month and 4 months.  She has embryos left but does not plan to attempt further pregnancy.  She now has adopted 2 children, one by herself and one with a partner.  Her kids are 5 and 58.   PAST MEDICAL HISTORY:  Past Medical History:  Diagnosis Date  . Adnexal mass   . Diabetes mellitus   . Diabetes mellitus without complication ([place])   . Gall bladder stones   . History of infection due to human papilloma virus (HPV)   . Hypertension   . Obesity   . PCOS (polycystic ovarian syndrome)   . Personal history of cervical dysplasia      PAST SURGICAL HISTORY:  Past Surgical History:  Procedure Laterality Date  . CHOLECYSTECTOMY N/A  12/05/2018   Procedure: LAPAROSCOPIC CHOLECYSTECTOMY WITH INTRAOPERATIVE CHOLANGIOGRAM;  Surgeon: Alphonsa Overall, MD;  Location: [facility];  Service: General;  Laterality: N/A;  . KNEE ARTHROSCOPY Right 1993    OB/GYN HISTORY:  OB History  Gravida Para Term Preterm AB Living  2 1     1  0  SAB TAB Ectopic Multiple Live Births               # Outcome Date GA Lbr Len/2nd Weight Sex Delivery Anes PTL Lv  2 AB           1 Para             Obstetric Comments  Adopted 1 child    Patient's last menstrual period was 06/28/2019 (approximate).  Age at menarche: 60 Age at menopause: N/A Hx of HRT: Denies Hx of STDs: HPV Last pap: 04/2018, CIN1 and HPV History of abnormal pap smears: yes  SCREENING STUDIES:  Last mammogram: 03/2018  Last colonoscopy: n/a  MEDICATIONS: Outpatient Encounter Medications as of 07/23/2019  Medication Sig  . losartan-hydrochlorothiazide (HYZAAR) 50-12.5 MG tablet Take 1 tablet by mouth daily.  . metFORMIN (GLUCOPHAGE) 500 MG tablet Take one tablet with lunch and 2 tablets with dinner  . ibuprofen (ADVIL) 800 MG tablet Take 1 tablet (800 mg total) by mouth every 8 (eight) hours as needed. For AFTER  surgery only  . medroxyPROGESTERone (PROVERA) 10 MG tablet Take 10 mg by mouth daily.  Marland Kitchen oxyCODONE (OXY IR/ROXICODONE) 5 MG immediate release tablet Take 1 tablet (5 mg total) by mouth every 4 (four) hours as needed for severe pain. For AFTER surgery, do not take and drive  . senna-docusate (SENOKOT-S) 8.6-50 MG tablet Take 2 tablets by mouth at bedtime. For AFTER surgery, do not take if having diarrhea   No facility-administered encounter medications on file as of 07/23/2019.    ALLERGIES:  Allergies  Allergen Reactions  . Lisinopril Swelling     FAMILY HISTORY:  Family History  Problem Relation Age of Onset  . Breast cancer Paternal Grandmother   . Lung cancer Paternal Grandfather   . Ovarian cancer Neg Hx   . Endometrial cancer Neg Hx      SOCIAL  HISTORY:    Social Connections:   . Frequency of Communication with Friends and Family: Not on file  . Frequency of Social Gatherings with Friends and Family: Not on file  . Attends Religious Services: Not on file  . Active Member of Clubs or Organizations: Not on file  . Attends Archivist Meetings: Not on file  . Marital Status: Not on file    REVIEW OF SYSTEMS:  Denies appetite changes, fevers, chills, fatigue, unexplained weight changes. Denies hearing loss, neck lumps or masses, mouth sores, ringing in ears or voice changes. Denies cough or wheezing.  Denies shortness of breath. Denies chest pain or palpitations. Denies leg swelling. Denies abdominal distention, pain, blood in stools, constipation, diarrhea, nausea, vomiting, or early satiety. Denies pain with intercourse, dysuria, frequency, hematuria or incontinence. Denies hot flashes, pelvic pain, vaginal bleeding or vaginal discharge.   Denies joint pain, back pain or muscle pain/cramps. Denies itching, rash, or wounds. Denies dizziness, headaches, numbness or seizures. Denies swollen lymph nodes or glands, denies easy bruising or bleeding. Denies anxiety, depression, confusion, or decreased concentration.  Physical Exam:  Vital Signs for this encounter:  Blood pressure 127/83, pulse 71, temperature 98.2 F (36.8 C), temperature source Temporal, resp. rate 18, height 6' (1.829 m), weight (!) 307 lb (139.3 kg), last menstrual period 06/28/2019, SpO2 100 %. Body mass index is 41.64 kg/m. General: Alert, oriented, no acute distress.  HEENT: Normocephalic, atraumatic. Sclera anicteric.  Chest: Clear to auscultation bilaterally. No wheezes, rhonchi, or rales. Cardiovascular: Regular rate and rhythm, no murmurs, rubs, or gallops.  Abdomen: Obese. Normoactive bowel sounds. Soft, nondistended, nontender to palpation. No masses or hepatosplenomegaly appreciated. No palpable fluid wave. Well healed laparoscopic  incisions. Extremities: Grossly normal range of motion. Warm, well perfused. No edema bilaterally.  Skin: No rashes or lesions.  Lymphatics: No cervical, supraclavicular, or inguinal adenopathy.  GU:  Normal external female genitalia.  No lesions. No discharge or bleeding.             Bladder/urethra:  No lesions or masses, well supported bladder             Vagina: Well rugated vaginal mucosa.  No lesions.             Cervix: Normal appearing, no lesions.  Cervix visually dilated 1-2 cm.  Pap test and HPV collected.             Uterus: Small, mobile, retroverted, no parametrial involvement or nodularity.             Adnexa: Mass appreciated to move with the uterus, which feels retroverted.  Midline location of the  mass.  Rectal: Deferred.  Endometrial Biopsy  The procedure was explained.  Verbal consent was obtained.  The speculum was placed and the cervix was prepped with betadine.  The endometrial biopsy was performed with  passes of the endometrial biopsy pipelle. Scant tissue was obtained. The uterus sounded to 7-8 cm.  The patient tolerated procedure.    LABORATORY AND RADIOLOGIC DATA:  Outside medical records were reviewed to synthesize the above history, along with the history and physical obtained during the visit.   Lab Results  Component Value Date   WBC 9.6 12/05/2018   HGB 12.9 12/05/2018   HCT 41.3 12/05/2018   PLT 196 12/05/2018   GLUCOSE 100 (H) 04/09/2019   CHOL 132 04/09/2019   TRIG 50 04/09/2019   HDL 54 04/09/2019   LDLCALC 67 04/09/2019   ALT 44 (H) 04/09/2019   AST 33 04/09/2019   NA 140 04/09/2019   K 4.5 04/09/2019   CL 104 04/09/2019   CREATININE 0.89 04/09/2019   BUN 19 04/09/2019   CO2 19 (L) 04/09/2019   TSH 2.740 11/30/2018   HGBA1C 6.8 (H) 04/09/2019   Ct A/P on 1/22: Vascular/Lymphatic: Left periaortic lymph node 1.2 cm in short axis on image 41/2, previously 0.8 cm on 05/27/2010.  Reproductive: 7.0 by 6.1 by 5.7 cm right adnexal mass  demonstrates low density elements as well as enhancing elements, with some of the associated venous drainage probably from the left renal vein which is interesting giving the right eccentricity of the lesion. Difficult to separately identify the left ovary, and although this lesion is eccentric to the right at may be associated with the left ovary. 3.9 by 2.1 cm structure along the posteroinferior margin of this lesion on image 85/2 probably represents right ovarian tissue.  Retroverted uterus.  Other: No appreciable ascites or nodularity along the mesentery or omentum.  Musculoskeletal: Spurring at L5-S1 contributing to considerable bilateral foraminal impingement. There is loss of disc height and endplate sclerosis at this level and posterior intervertebral spurring causes borderline central narrowing of the thecal sac.  IMPRESSION: 1. A likely cystic and solid mass along the right dorsal side of the uterus appears to have venous drainage through the left ovarian vein, and could represent a left ovarian lesion which is simply grown eccentric to the right side, or a right adnexal lesion with tumor vascularity. This lesion abuts the anterior superior margin of the right ovary and measures up to 7.0 cm. The lesion size and internal enhancement favor malignant etiology although some benign cystic and solid ovarian masses could have a similar appearance. Correlation with the prior sonographic appearance is recommended along with correlation with CA-125 level or other tumor markers. Surgical evaluation should be considered. 2. Mildly enlarged left periaortic lymph node at 1.2 cm in short axis (previously 0.8 cm on 05/27/2010). Although nonspecific this does raise the possibility of a malignant lymph node, and should at least be surveilled. 3. Other imaging findings of potential clinical significance: Hepatic steatosis. Chronic right adrenal nodule, likely benign. Spurring causes  notable impingement at L5-S1.

## 2019-07-23 ENCOUNTER — Other Ambulatory Visit: Payer: Self-pay | Admitting: Gynecologic Oncology

## 2019-07-23 ENCOUNTER — Other Ambulatory Visit: Payer: Self-pay

## 2019-07-23 ENCOUNTER — Encounter: Payer: Self-pay | Admitting: Gynecologic Oncology

## 2019-07-23 ENCOUNTER — Other Ambulatory Visit (HOSPITAL_COMMUNITY)
Admission: RE | Admit: 2019-07-23 | Discharge: 2019-07-23 | Disposition: A | Discharge status: Home / Self Care | Payer: 59 | Source: Ambulatory Visit | Attending: Gynecologic Oncology | Admitting: Gynecologic Oncology

## 2019-07-23 ENCOUNTER — Inpatient Hospital Stay: Payer: 59 | Attending: Gynecologic Oncology | Admitting: Gynecologic Oncology

## 2019-07-23 VITALS — BP 127/83 | HR 71 | Temp 98.2°F | Resp 18 | Ht 72.0 in | Wt 307.0 lb

## 2019-07-23 DIAGNOSIS — E282 Polycystic ovarian syndrome: Secondary | ICD-10-CM | POA: Diagnosis not present

## 2019-07-23 DIAGNOSIS — N939 Abnormal uterine and vaginal bleeding, unspecified: Secondary | ICD-10-CM | POA: Diagnosis not present

## 2019-07-23 DIAGNOSIS — Z124 Encounter for screening for malignant neoplasm of cervix: Secondary | ICD-10-CM | POA: Diagnosis present

## 2019-07-23 DIAGNOSIS — Z87410 Personal history of cervical dysplasia: Secondary | ICD-10-CM | POA: Diagnosis not present

## 2019-07-23 DIAGNOSIS — R1900 Intra-abdominal and pelvic swelling, mass and lump, unspecified site: Secondary | ICD-10-CM | POA: Diagnosis present

## 2019-07-23 DIAGNOSIS — R590 Localized enlarged lymph nodes: Secondary | ICD-10-CM | POA: Diagnosis not present

## 2019-07-23 MED ORDER — SENNOSIDES-DOCUSATE SODIUM 8.6-50 MG PO TABS: 2.0000 | ORAL_TABLET | Freq: Every day | ORAL | 1 refills | Status: DC

## 2019-07-23 MED ORDER — IBUPROFEN 800 MG PO TABS: 800.0000 mg | ORAL_TABLET | Freq: Three times a day (TID) | ORAL | 0 refills | Status: DC | PRN

## 2019-07-23 MED ORDER — OXYCODONE HCL 5 MG PO TABS: 5.0000 mg | ORAL_TABLET | ORAL | 0 refills | Status: DC | PRN

## 2019-07-23 NOTE — Addendum Note (Signed)
Addended by: Joylene John D on: 07/23/2019 01:17 PM   Modules accepted: Orders

## 2019-07-23 NOTE — Patient Instructions (Addendum)
Preparing for your Surgery  Plan for surgery on August 05, 2019 with Dr. Jeral Pinch at [place] [facility] will be scheduled for a robotic assisted laparoscopic unilateral oophorectomy, bilateral salpingectomy, possible total laparoscopic hysterectomy, possible staging.   We will contact you with the results of your pap smear and biopsy from today.   Pre-operative Testing -You will receive a phone call from presurgical testing at [facility] to discuss pre-operative instructions and to arrange COVID test.  -Bring your insurance card, copy of an advanced directive if applicable, medication list  -At that visit, you will be asked to sign a consent for a possible blood transfusion in case a transfusion becomes necessary during surgery.  The need for a blood transfusion is rare but having consent is a necessary part of your care.     -You should not be taking blood thinners or aspirin at least ten days prior to surgery unless instructed by your surgeon.  -Do not take supplements such as fish oil (omega 3), red yeast rice, tumeric before your surgery.   Day Before Surgery at [facility] will be asked to take in a light diet the day before surgery.  Avoid carbonated beverages.  You will be advised to have nothing to eat or drink after midnight the evening before.    Eat a light diet the day before surgery.  Examples including soups, broths, toast, yogurt, mashed potatoes.  Things to avoid include carbonated beverages (fizzy beverages), raw fruits and raw vegetables, or beans.   If your bowels are filled with gas, your surgeon will have difficulty visualizing your pelvic organs which increases your surgical risks.  Your role in recovery Your role is to become active as soon as directed by your doctor, while still giving yourself time to heal.  Rest when you feel tired. You will be asked to do the following in order to speed your recovery:  - Cough and breathe deeply. This  helps to clear and expand your lungs and can prevent pneumonia after surgery.  - [place]. Do mild physical activity. Walking or moving your legs help your circulation and body functions return to normal. Do not try to get up or walk alone the first time after surgery.   -If you develop swelling on one leg or the other, pain in the back of your leg, redness/warmth in one of your legs, please call the office or go to the Emergency Room to have a doppler to rule out a blood clot. For shortness of breath, chest pain-seek care in the Emergency Room as soon as possible. - Actively manage your pain. Managing your pain lets you move in comfort. We will ask you to rate your pain on a scale of zero to 10. It is your responsibility to tell your doctor or nurse where and how much you hurt so your pain can be treated.  Special Considerations -If you are diabetic, you may be placed on insulin after surgery to have closer control over your blood sugars to promote healing and recovery.  This does not mean that you will be discharged on insulin.  If applicable, your oral antidiabetics will be resumed when you are tolerating a solid diet.  -Your final pathology results from surgery should be available around one week after surgery and the results will be relayed to you when available.  -Dr. Lahoma Crocker is the surgeon that assists your GYN Oncologist with surgery.  If you end up  staying the night, the next day after your surgery you will either see Dr. Denman George, Dr. Berline Lopes, or Dr. Lahoma Crocker.  -FMLA forms can be faxed to [phone] and please allow 5-7 business days for completion.  Pain Management After Surgery -You have been prescribed your pain medication and bowel regimen medications before surgery so that you can have these available when you are discharged from the hospital. The pain medication is for use ONLY AFTER surgery and a new prescription will not be given.   -Make  sure that you have Tylenol and Ibuprofen at home to use on a regular basis after surgery for pain control. We recommend alternating the medications every hour to six hours since they work differently and are processed in the body differently for pain relief.  -Review the attached handout on narcotic use and their risks and side effects.   Bowel Regimen -You have been prescribed Sennakot-S to take nightly to prevent constipation especially if you are taking the narcotic pain medication intermittently.  It is important to prevent constipation and drink adequate amounts of liquids. You can stop taking this medication when you are not taking pain medication and you are back on your normal bowel routine.   Blood Transfusion Information (For the consent to be signed before surgery)  We will be checking your blood type before surgery so in case of emergencies, we will know what type of blood you would need.                                            WHAT IS A BLOOD TRANSFUSION?  A transfusion is the replacement of blood or some of its parts. Blood is made up of multiple cells which provide different functions.  Red blood cells carry oxygen and are used for blood loss replacement.  White blood cells fight against infection.  Platelets control bleeding.  Plasma helps clot blood.  Other blood products are available for specialized needs, such as hemophilia or other clotting disorders. BEFORE THE TRANSFUSION  Who gives blood for transfusions?   You may be able to donate blood to be used at a later date on yourself (autologous donation).  Relatives can be asked to donate blood. This is generally not any safer than if you have received blood from a stranger. The same precautions are taken to ensure safety when a relative's blood is donated.  Healthy volunteers who are fully evaluated to make sure their blood is safe. This is blood bank blood. Transfusion therapy is the safest it has ever been in  the practice of medicine. Before blood is taken from a donor, a complete history is taken to make sure that person has no history of diseases nor engages in risky social behavior (examples are intravenous drug use or sexual activity with multiple partners). The donor's travel history is screened to minimize risk of transmitting infections, such as malaria. The donated blood is tested for signs of infectious diseases, such as HIV and hepatitis. The blood is then tested to be sure it is compatible with you in order to minimize the chance of a transfusion reaction. If you or a relative donates blood, this is often done in anticipation of surgery and is not appropriate for emergency situations. It takes many days to process the donated blood. RISKS AND COMPLICATIONS Although transfusion therapy is very safe and saves many lives, the  main dangers of transfusion include:   Getting an infectious disease.  Developing a transfusion reaction. This is an allergic reaction to something in the blood you were given. Every precaution is taken to prevent this. The decision to have a blood transfusion has been considered carefully by your caregiver before blood is given. Blood is not given unless the benefits outweigh the risks.  AFTER SURGERY INSTRUCTIONS  Return to work: 4-6 weeks if applicable  Activity: 1. Be up and out of the bed during the day.  Take a nap if needed.  You may walk up steps but be careful and use the hand rail.  Stair climbing will tire you more than you think, you may need to stop part way and rest.   2. No lifting or straining for 6 weeks over 10 pounds. No pushing, pulling, straining for 6 weeks.  3. No driving for 1 week(s).  Do not drive if you are taking narcotic pain medicine.   4. You can shower as soon as the next day after surgery. Shower daily.  Use soap and water on your incision and pat dry; don't rub.  No tub baths or submerging your body in water until cleared by your surgeon.  If you have the soap that was given to you by pre-surgical testing that was used before surgery, you do not need to use it afterwards because this can irritate your incisions.   5. No sexual activity and nothing in the vagina for 2 weeks. IF YOU HAVE A HYSTERECTOMY, NOTHING IN THE VAGINA FOR 8 WEEKS.  6. You may experience a small amount of clear drainage from your incisions, which is normal.  If the drainage persists, increases, or changes color please call the office.  7. Do not use creams, lotions, or ointments such as neosporin on your incisions after surgery until advised by your surgeon because they can cause removal of the dermabond glue on your incisions.    8. IF YOU HAVE A HYSTERECTOMY: You may experience vaginal spotting after surgery or around the 6-8 week mark from surgery when the stitches at the top of the vagina begin to dissolve.  The spotting is normal but if you experience heavy bleeding, call our office.  9. Take Tylenol or ibuprofen first for pain and only use narcotic pain medication for severe pain not relieved by the Tylenol or Ibuprofen.  Monitor your Tylenol intake to a max of 4,000 mg.  Diet: 1. Low sodium Heart Healthy Diet is recommended.  2. It is safe to use a laxative, such as Miralax or Colace, if you have difficulty moving your bowels. You can take Sennakot at bedtime every evening to keep bowel movements regular and to prevent constipation.    Wound Care: 1. Keep clean and dry.  Shower daily.  Reasons to call the Doctor:  Fever - Oral temperature greater than 100.4 degrees Fahrenheit  Foul-smelling vaginal discharge  Difficulty urinating  Nausea and vomiting  Increased pain at the site of the incision that is unrelieved with pain medicine.  Difficulty breathing with or without chest pain  New calf pain especially if only on one side  Sudden, continuing increased vaginal bleeding with or without clots.   Contacts: For questions or concerns you  should contact:  Dr. Jeral Pinch at [phone]  Joylene John, NP at [phone]  After Hours: call [phone] and have the GYN Oncologist paged/contacted   Endometrial Biopsy, Care After  This sheet gives you information about how to care  for yourself after your procedure. Your health care provider may also give you more specific instructions. If you have problems or questions, contact your health care provider. What can I expect after the procedure? After the procedure, it is common to have:  Mild cramping.  A small amount of vaginal bleeding for a few days. This is normal. Follow these instructions at home:   Take over-the-counter and prescription medicines only as told by your health care provider.  Do not douche, use tampons, or have sexual intercourse until your health care provider approves.  Return to your normal activities as told by your health care provider. Ask your health care provider what activities are safe for you.  Follow instructions from your health care provider about any activity restrictions, such as restrictions on strenuous exercise or heavy lifting. Contact a health care provider if:  You have heavy bleeding, or bleed for longer than 2 days after the procedure.  You have bad smelling discharge from your vagina.  You have a fever or chills.  You have a burning sensation when urinating or you have difficulty urinating.  You have severe pain in your lower abdomen. Get help right away if:  You have severe cramps in your stomach or back.  You pass large blood clots.  Your bleeding increases.  You become weak or light-headed, or you pass out. Summary  After the procedure, it is common to have mild cramping and a small amount of vaginal bleeding for a few days.  Do not douche, use tampons, or have sexual intercourse until your health care provider approves.  Return to your normal activities as told by your health care provider. Ask  your health care provider what activities are safe for you. This information is not intended to replace advice given to you by your health care provider. Make sure you discuss any questions you have with your health care provider. Document Revised: 05/23/2017 Document Reviewed: 06/26/2016 Elsevier Patient Education  [place].

## 2019-07-26 DIAGNOSIS — D3911 Neoplasm of uncertain behavior of right ovary: Secondary | ICD-10-CM

## 2019-07-26 HISTORY — DX: Neoplasm of uncertain behavior of right ovary: D39.11

## 2019-07-26 LAB — SURGICAL PATHOLOGY

## 2019-07-27 LAB — CYTOLOGY - PAP
Comment: NEGATIVE
Diagnosis: NEGATIVE
High risk HPV: NEGATIVE

## 2019-07-28 NOTE — Patient Instructions (Addendum)
DUE TO COVID-19 ONLY ONE VISITOR IS ALLOWED TO COME WITH YOU AND STAY IN THE WAITING ROOM ONLY DURING PRE OP AND PROCEDURE DAY OF SURGERY. THE 1 VISITOR MAY VISIT WITH YOU AFTER SURGERY IN YOUR PRIVATE ROOM DURING VISITING HOURS ONLY!   YOU NEED TO HAVE A COVID 19 TEST ON___2/8/2021____ @___2 :55PM____, THIS TEST MUST BE DONE BEFORE SURGERY, COME  [facility] , [id number].  ([place]) ONCE YOUR COVID TEST IS COMPLETED, PLEASE BEGIN THE QUARANTINE INSTRUCTIONS AS OUTLINED IN YOUR HANDOUT.                Doris Armstrong   Your procedure is scheduled on: 08/05/2019   Report to [facility] Main  Entrance   Report to [place] [facility] at 5:30AM     Call this number if you have problems the morning of surgery [phone]    Remember: Eat a light diet the day before surgery.  Examples including soups, broths, toast, yogurt, mashed potatoes.  Things to avoid include carbonated beverages (fizzy beverages), raw fruits and raw vegetables, or beans. If your bowels are filled with gas, your surgeon will have difficulty visualizing your pelvic organs which increases your surgical risks.    BRUSH YOUR TEETH MORNING OF SURGERY AND RINSE YOUR MOUTH OUT, NO CHEWING GUM CANDY OR MINTS.      Take these medicines the morning of surgery with A SIP OF WATER: (medroxyprogesterone)PROVERA   How to Manage Your Diabetes Before and After Surgery  Why is it important to control my blood sugar before and after surgery? . Improving blood sugar levels before and after surgery helps healing and can limit problems. . A way of improving blood sugar control is eating a healthy diet by: o  Eating less sugar and carbohydrates o  Increasing activity/exercise o  Talking with your doctor about reaching your blood sugar goals . High blood sugars (greater than 180 mg/dL) can raise your risk of infections and slow your recovery, so you will need to focus on controlling your diabetes during  the weeks before surgery. . Make sure that the doctor who takes care of your diabetes knows about your planned surgery including the date and location.  How do I manage my blood sugar before surgery? . Check your blood sugar at least 4 times a day, starting 2 days before surgery, to make sure that the level is not too high or low. o Check your blood sugar the morning of your surgery when you wake up and every 2 hours until you get to the Short Stay unit. . If your blood sugar is less than 70 mg/dL, you will need to treat for low blood sugar: o Do not take insulin. o Treat a low blood sugar (less than 70 mg/dL) with  cup of clear juice (cranberry or apple), 4 glucose tablets, OR glucose gel. o Recheck blood sugar in 15 minutes after treatment (to make sure it is greater than 70 mg/dL). If your blood sugar is not greater than 70 mg/dL on recheck, call [phone] for further instructions. . Report your blood sugar to the short stay nurse when you get to Short Stay.  . If you are admitted to the hospital after surgery: o Your blood sugar will be checked by the staff and you will probably be given insulin after surgery (instead of oral diabetes medicines) to make sure you have good blood sugar levels. o The goal for blood sugar control after surgery is  80-180 mg/dL.   WHAT DO I DO ABOUT MY DIABETES MEDICATION?  DO NOT TAKE ANY DIABETIC MEDICATIONS DAY OF YOUR SURGERY    Reviewed and Endorsed by [facility] Patient Education Committee, August 2015                                You may not have any metal on your body including hair pins and              piercings  Do not wear jewelry, make-up, lotions, powders or perfumes, deodorant             Do not wear nail polish on your fingernails.  Do not shave  48 hours prior to surgery.              Do not bring valuables to the hospital. [place].  Contacts, dentures or bridgework may not be  worn into surgery.     Patients discharged the day of surgery will not be allowed to drive home. IF YOU ARE HAVING SURGERY AND GOING HOME THE SAME DAY, YOU MUST HAVE AN ADULT TO DRIVE YOU HOME AND BE WITH YOU FOR 24 HOURS. YOU MAY GO HOME BY TAXI OR UBER OR ORTHERWISE, BUT AN ADULT MUST ACCOMPANY YOU HOME AND STAY WITH YOU FOR 24 HOURS.  Name and phone number of your driver:  Special Instructions: N/A              Please read over the following fact sheets you were given: _____________________________________________________________________             [facility] - Preparing for Surgery Before surgery, you can play an important role.  Because skin is not sterile, your skin needs to be as free of germs as possible.  You can reduce the number of germs on your skin by washing with CHG (chlorahexidine gluconate) soap before surgery.  CHG is an antiseptic cleaner which kills germs and bonds with the skin to continue killing germs even after washing. Please DO NOT use if you have an allergy to CHG or antibacterial soaps.  If your skin becomes reddened/irritated stop using the CHG and inform your nurse when you arrive at Short Stay. Do not shave (including legs and underarms) for at least 48 hours prior to the first CHG shower.  You may shave your face/neck. Please follow these instructions carefully:  1.  Shower with CHG Soap the night before surgery and the  morning of Surgery.  2.  If you choose to wash your hair, wash your hair first as usual with your  normal  shampoo.  3.  After you shampoo, rinse your hair and body thoroughly to remove the  shampoo.                           4.  Use CHG as you would any other liquid soap.  You can apply chg directly  to the skin and wash                       Gently with a scrungie or clean washcloth.  5.  Apply the CHG Soap to your body ONLY FROM THE NECK DOWN.   Do not use on face/ open  Wound or open sores. Avoid contact with  eyes, ears mouth and genitals (private parts).                       Wash face,  Genitals (private parts) with your normal soap.             6.  Wash thoroughly, paying special attention to the area where your surgery  will be performed.  7.  Thoroughly rinse your body with warm water from the neck down.  8.  DO NOT shower/wash with your normal soap after using and rinsing off  the CHG Soap.                9.  Pat yourself dry with a clean towel.            10.  Wear clean pajamas.            11.  Place clean sheets on your bed the night of your first shower and do not  sleep with pets. Day of Surgery : Do not apply any lotions/deodorants the morning of surgery.  Please wear clean clothes to the hospital/surgery center.  FAILURE TO FOLLOW THESE INSTRUCTIONS MAY RESULT IN THE CANCELLATION OF YOUR SURGERY PATIENT SIGNATURE_________________________________  NURSE SIGNATURE__________________________________  ________________________________________________________________________   Adam Phenix  An incentive spirometer is a tool that can help keep your lungs clear and active. This tool measures how well you are filling your lungs with each breath. Taking long deep breaths may help reverse or decrease the chance of developing breathing (pulmonary) problems (especially infection) following:  A long period of time when you are unable to move or be active. BEFORE THE PROCEDURE   If the spirometer includes an indicator to show your best effort, your nurse or respiratory therapist will set it to a desired goal.  If possible, sit up straight or lean slightly forward. Try not to slouch.  Hold the incentive spirometer in an upright position. INSTRUCTIONS FOR USE  1. Sit on the edge of your bed if possible, or sit up as far as you can in bed or on a chair. 2. Hold the incentive spirometer in an upright position. 3. Breathe out normally. 4. Place the mouthpiece in your mouth and seal your  lips tightly around it. 5. Breathe in slowly and as deeply as possible, raising the piston or the ball toward the top of the column. 6. Hold your breath for 3-5 seconds or for as long as possible. Allow the piston or ball to fall to the bottom of the column. 7. Remove the mouthpiece from your mouth and breathe out normally. 8. Rest for a few seconds and repeat Steps 1 through 7 at least 10 times every 1-2 hours when you are awake. Take your time and take a few normal breaths between deep breaths. 9. The spirometer may include an indicator to show your best effort. Use the indicator as a goal to work toward during each repetition. 10. After each set of 10 deep breaths, practice coughing to be sure your lungs are clear. If you have an incision (the cut made at the time of surgery), support your incision when coughing by placing a pillow or rolled up towels firmly against it. Once you are able to get out of bed, walk around indoors and cough well. You may stop using the incentive spirometer when instructed by your caregiver.  RISKS AND COMPLICATIONS  Take your time so you do not  get dizzy or light-headed.  If you are in pain, you may need to take or ask for pain medication before doing incentive spirometry. It is harder to take a deep breath if you are having pain. AFTER USE  Rest and breathe slowly and easily.  It can be helpful to keep track of a log of your progress. Your caregiver can provide you with a simple table to help with this. If you are using the spirometer at home, follow these instructions: [place] IF:   You are having difficultly using the spirometer.  You have trouble using the spirometer as often as instructed.  Your pain medication is not giving enough relief while using the spirometer.  You develop fever of 100.5 F (38.1 C) or higher. SEEK IMMEDIATE MEDICAL CARE IF:   You cough up bloody sputum that had not been present before.  You develop fever of 102 F  (38.9 C) or greater.  You develop worsening pain at or near the incision site. MAKE SURE YOU:   Understand these instructions.  Will watch your condition.  Will get help right away if you are not doing well or get worse. Document Released: 10/21/2006 Document Revised: 09/02/2011 Document Reviewed: 12/22/2006 Exit[employer] Patient Information 2014 [employer], [place].   ________________________________________________________________________  WHAT IS A BLOOD TRANSFUSION? Blood Transfusion Information  A transfusion is the replacement of blood or some of its parts. Blood is made up of multiple cells which provide different functions.  Red blood cells carry oxygen and are used for blood loss replacement.  White blood cells fight against infection.  Platelets control bleeding.  Plasma helps clot blood.  Other blood products are available for specialized needs, such as hemophilia or other clotting disorders. BEFORE THE TRANSFUSION  Who gives blood for transfusions?   Healthy volunteers who are fully evaluated to make sure their blood is safe. This is blood bank blood. Transfusion therapy is the safest it has ever been in the practice of medicine. Before blood is taken from a donor, a complete history is taken to make sure that person has no history of diseases nor engages in risky social behavior (examples are intravenous drug use or sexual activity with multiple partners). The donor's travel history is screened to minimize risk of transmitting infections, such as malaria. The donated blood is tested for signs of infectious diseases, such as HIV and hepatitis. The blood is then tested to be sure it is compatible with you in order to minimize the chance of a transfusion reaction. If you or a relative donates blood, this is often done in anticipation of surgery and is not appropriate for emergency situations. It takes many days to process the donated blood. RISKS AND COMPLICATIONS Although  transfusion therapy is very safe and saves many lives, the main dangers of transfusion include:   Getting an infectious disease.  Developing a transfusion reaction. This is an allergic reaction to something in the blood you were given. Every precaution is taken to prevent this. The decision to have a blood transfusion has been considered carefully by your caregiver before blood is given. Blood is not given unless the benefits outweigh the risks. AFTER THE TRANSFUSION  Right after receiving a blood transfusion, you will usually feel much better and more energetic. This is especially true if your red blood cells have gotten low (anemic). The transfusion raises the level of the red blood cells which carry oxygen, and this usually causes an energy increase.  The nurse administering the transfusion  will monitor you carefully for complications. HOME CARE INSTRUCTIONS  No special instructions are needed after a transfusion. You may find your energy is better. Speak with your caregiver about any limitations on activity for underlying diseases you may have. SEEK MEDICAL CARE IF:   Your condition is not improving after your transfusion.  You develop redness or irritation at the intravenous (IV) site. SEEK IMMEDIATE MEDICAL CARE IF:  Any of the following symptoms occur over the next 12 hours:  Shaking chills.  You have a temperature by mouth above 102 F (38.9 C), not controlled by medicine.  Chest, back, or muscle pain.  People around you feel you are not acting correctly or are confused.  Shortness of breath or difficulty breathing.  Dizziness and fainting.  You get a rash or develop hives.  You have a decrease in urine output.  Your urine turns a dark color or changes to pink, red, or brown. Any of the following symptoms occur over the next 10 days:  You have a temperature by mouth above 102 F (38.9 C), not controlled by medicine.  Shortness of breath.  Weakness after normal  activity.  The white part of the eye turns yellow (jaundice).  You have a decrease in the amount of urine or are urinating less often.  Your urine turns a dark color or changes to pink, red, or brown. Document Released: 06/07/2000 Document Revised: 09/02/2011 Document Reviewed: 01/25/2008 [facility] Patient Information 2014 [place], [place].  _______________________________________________________________________

## 2019-07-30 ENCOUNTER — Other Ambulatory Visit: Payer: Self-pay

## 2019-07-30 ENCOUNTER — Encounter (HOSPITAL_COMMUNITY): Payer: Self-pay

## 2019-07-30 ENCOUNTER — Encounter (HOSPITAL_COMMUNITY)
Admission: RE | Admit: 2019-07-30 | Discharge: 2019-07-30 | Disposition: A | Discharge status: Home / Self Care | Payer: 59 | Source: Ambulatory Visit | Attending: Gynecologic Oncology | Admitting: Gynecologic Oncology

## 2019-07-30 DIAGNOSIS — R9431 Abnormal electrocardiogram [ECG] [EKG]: Secondary | ICD-10-CM | POA: Insufficient documentation

## 2019-07-30 DIAGNOSIS — Z01818 Encounter for other preprocedural examination: Secondary | ICD-10-CM | POA: Insufficient documentation

## 2019-07-30 LAB — COMPREHENSIVE METABOLIC PANEL
ALT: 93 U/L — ABNORMAL HIGH (ref 0–44)
AST: 55 U/L — ABNORMAL HIGH (ref 15–41)
Albumin: 4.1 g/dL (ref 3.5–5.0)
Alkaline Phosphatase: 49 U/L (ref 38–126)
Anion gap: 7 (ref 5–15)
BUN: 18 mg/dL (ref 6–20)
CO2: 25 mmol/L (ref 22–32)
Calcium: 9.1 mg/dL (ref 8.9–10.3)
Chloride: 107 mmol/L (ref 98–111)
Creatinine, Ser: 1.01 mg/dL — ABNORMAL HIGH (ref 0.44–1.00)
GFR calc Af Amer: >60 mL/min (ref >60)
GFR calc non Af Amer: >60 mL/min (ref >60)
Glucose, Bld: 123 mg/dL — ABNORMAL HIGH (ref 70–99)
Potassium: 4.6 mmol/L (ref 3.5–5.1)
Sodium: 139 mmol/L (ref 135–145)
Total Bilirubin: 0.8 mg/dL (ref 0.3–1.2)
Total Protein: 7.5 g/dL (ref 6.5–8.1)

## 2019-07-30 LAB — URINALYSIS, ROUTINE W REFLEX MICROSCOPIC
Bilirubin Urine: NEGATIVE
Glucose, UA: NEGATIVE mg/dL
Hgb urine dipstick: NEGATIVE
Ketones, ur: NEGATIVE mg/dL
Leukocytes,Ua: NEGATIVE
Nitrite: NEGATIVE
Protein, ur: NEGATIVE mg/dL
Specific Gravity, Urine: 1.027 (ref 1.005–1.030)
pH: 6 (ref 5.0–8.0)

## 2019-07-30 LAB — ABO/RH: ABO/RH(D): AB POS

## 2019-07-30 LAB — CBC
HCT: 43.2 % (ref 36.0–46.0)
Hemoglobin: 14.1 g/dL (ref 12.0–15.0)
MCH: 32.2 pg (ref 26.0–34.0)
MCHC: 32.6 g/dL (ref 30.0–36.0)
MCV: 98.6 fL (ref 80.0–100.0)
Platelets: 228 10*3/uL (ref 150–400)
RBC: 4.38 MIL/uL (ref 3.87–5.11)
RDW: 13.1 % (ref 11.5–15.5)
WBC: 6.6 10*3/uL (ref 4.0–10.5)
nRBC: 0 % (ref 0.0–0.2)

## 2019-07-30 NOTE — Progress Notes (Signed)
PCP - Forrest Moron, MD Cardiologist -   Chest x-ray -  EKG - ordered for 07/30/2019 Stress Test -  ECHO -  Cardiac Cath -   Sleep Study -  CPAP -   Fasting Blood Sugar - cannot recall Checks Blood Sugar- __seldomly___   Blood Thinner Instructions: Aspirin Instructions: Last Dose:  Anesthesia review:   Patient denies shortness of breath, fever, cough and chest pain at PAT appointment   Patient verbalized understanding of instructions that were given to them at the PAT appointment. Patient was also instructed that they will need to review over the PAT instructions again at home before surgery.

## 2019-08-02 ENCOUNTER — Other Ambulatory Visit (HOSPITAL_COMMUNITY)
Admission: RE | Admit: 2019-08-02 | Discharge: 2019-08-02 | Disposition: A | Discharge status: Home / Self Care | Payer: 59 | Source: Ambulatory Visit | Attending: Gynecologic Oncology | Admitting: Gynecologic Oncology

## 2019-08-02 DIAGNOSIS — Z01812 Encounter for preprocedural laboratory examination: Secondary | ICD-10-CM | POA: Diagnosis not present

## 2019-08-02 DIAGNOSIS — Z20822 Contact with and (suspected) exposure to covid-19: Secondary | ICD-10-CM | POA: Insufficient documentation

## 2019-08-02 LAB — SARS CORONAVIRUS 2 (TAT 6-24 HRS): SARS Coronavirus 2: NEGATIVE

## 2019-08-03 ENCOUNTER — Telehealth: Payer: Self-pay | Admitting: Family Medicine

## 2019-08-03 NOTE — Telephone Encounter (Signed)
Spoke with pt and she has noticed that her BP is running 145/83 today and she is not allowed to take her BP medication on the day of her surgery. She is not taking the carvedilol bc she noticed that her tongue was swelling. She stated that if the day of her surgery and her BP is high they will not do the surgery and wants to know what you will suggest or possible adjustmentt of her medication.  Please Advise.

## 2019-08-03 NOTE — Telephone Encounter (Signed)
Copied from [place] [phone]. Topic: General - Other >> Aug 03, 2019 10:34 AM Yvette Rack wrote: Reason for CRM: Pt stated she will be having surgery on 08/05/19 and she would like to speak with Dr. Nolon Rod' nurse to discuss possibly adjusting her BP medication. Pt requests call back

## 2019-08-04 ENCOUNTER — Telehealth: Payer: Self-pay

## 2019-08-04 NOTE — Telephone Encounter (Signed)
Doris Armstrong states that she understands her pre op instructions. She will be taking her BP meds ~7 pm tonight as she usually takes them ~12 mn. Earlier will be better as her surgery is at 0730 tomorrow.

## 2019-08-04 NOTE — Anesthesia Preprocedure Evaluation (Addendum)
Anesthesia Evaluation  Patient identified by MRN, date of birth, ID band Patient awake    Reviewed: Allergy & Precautions, NPO status , Patient's Chart, lab work & pertinent test results  History of Anesthesia Complications (+) PONV  Airway Mallampati: II  TM Distance: >3 FB Neck ROM: Full    Dental  (+) Teeth Intact, Dental Advisory Given   Pulmonary former smoker,    breath sounds clear to auscultation       Cardiovascular hypertension,  Rhythm:Regular Rate:Normal     Neuro/Psych    GI/Hepatic   Endo/Other  diabetesMorbid obesity  Renal/GU      Musculoskeletal   Abdominal (+) + obese,   Peds  Hematology   Anesthesia Other Findings   Reproductive/Obstetrics                           Anesthesia Physical  Anesthesia Plan  ASA: III  Anesthesia Plan: General   Post-op Pain Management:    Induction: Intravenous  PONV Risk Score and Plan: Ondansetron, Dexamethasone, Scopolamine patch - Pre-op and Treatment may vary due to age or medical condition  Airway Management Planned: Oral ETT  Additional Equipment:   Intra-op Plan:   Post-operative Plan: Extubation in OR  Informed Consent: I have reviewed the patients History and Physical, chart, labs and discussed the procedure including the risks, benefits and alternatives for the proposed anesthesia with the patient or authorized representative who has indicated his/her understanding and acceptance.     Dental advisory given  Plan Discussed with: CRNA and Anesthesiologist  Anesthesia Plan Comments:       Anesthesia Quick Evaluation

## 2019-08-05 ENCOUNTER — Ambulatory Visit (HOSPITAL_COMMUNITY): Payer: 59 | Admitting: Physician Assistant

## 2019-08-05 ENCOUNTER — Encounter (HOSPITAL_COMMUNITY): Payer: Self-pay | Admitting: Gynecologic Oncology

## 2019-08-05 ENCOUNTER — Encounter (HOSPITAL_BASED_OUTPATIENT_CLINIC_OR_DEPARTMENT_OTHER)
Admission: RE | Disposition: A | Discharge status: Home / Self Care | Payer: 59 | Source: Other Acute Inpatient Hospital | Attending: Gynecologic Oncology

## 2019-08-05 ENCOUNTER — Other Ambulatory Visit: Payer: Self-pay

## 2019-08-05 ENCOUNTER — Ambulatory Visit (HOSPITAL_COMMUNITY)
Admission: RE | Admit: 2019-08-05 | Discharge: 2019-08-05 | Disposition: A | Discharge status: Home / Self Care | Payer: 59 | Source: Other Acute Inpatient Hospital | Attending: Gynecologic Oncology | Admitting: Gynecologic Oncology

## 2019-08-05 ENCOUNTER — Ambulatory Visit (HOSPITAL_COMMUNITY): Payer: 59 | Admitting: Anesthesiology

## 2019-08-05 DIAGNOSIS — M2429 Disorder of ligament, other specified site: Secondary | ICD-10-CM

## 2019-08-05 DIAGNOSIS — N838 Other noninflammatory disorders of ovary, fallopian tube and broad ligament: Secondary | ICD-10-CM | POA: Insufficient documentation

## 2019-08-05 DIAGNOSIS — Z79899 Other long term (current) drug therapy: Secondary | ICD-10-CM | POA: Diagnosis not present

## 2019-08-05 DIAGNOSIS — Z7984 Long term (current) use of oral hypoglycemic drugs: Secondary | ICD-10-CM | POA: Diagnosis not present

## 2019-08-05 DIAGNOSIS — K668 Other specified disorders of peritoneum: Secondary | ICD-10-CM | POA: Insufficient documentation

## 2019-08-05 DIAGNOSIS — Z793 Long term (current) use of hormonal contraceptives: Secondary | ICD-10-CM | POA: Insufficient documentation

## 2019-08-05 DIAGNOSIS — Z888 Allergy status to other drugs, medicaments and biological substances status: Secondary | ICD-10-CM | POA: Insufficient documentation

## 2019-08-05 DIAGNOSIS — D3912 Neoplasm of uncertain behavior of left ovary: Secondary | ICD-10-CM | POA: Diagnosis not present

## 2019-08-05 DIAGNOSIS — E119 Type 2 diabetes mellitus without complications: Secondary | ICD-10-CM | POA: Diagnosis not present

## 2019-08-05 DIAGNOSIS — K760 Fatty (change of) liver, not elsewhere classified: Secondary | ICD-10-CM | POA: Diagnosis not present

## 2019-08-05 DIAGNOSIS — Z6841 Body Mass Index (BMI) 40.0 and over, adult: Secondary | ICD-10-CM | POA: Insufficient documentation

## 2019-08-05 DIAGNOSIS — I10 Essential (primary) hypertension: Secondary | ICD-10-CM | POA: Diagnosis not present

## 2019-08-05 DIAGNOSIS — R1900 Intra-abdominal and pelvic swelling, mass and lump, unspecified site: Secondary | ICD-10-CM

## 2019-08-05 DIAGNOSIS — E282 Polycystic ovarian syndrome: Secondary | ICD-10-CM | POA: Insufficient documentation

## 2019-08-05 DIAGNOSIS — D271 Benign neoplasm of left ovary: Secondary | ICD-10-CM

## 2019-08-05 HISTORY — PX: ROBOTIC ASSISTED SALPINGO OOPHERECTOMY: SHX6082

## 2019-08-05 LAB — GLUCOSE, CAPILLARY
Glucose-Capillary: 142 mg/dL — ABNORMAL HIGH (ref 70–99)
Glucose-Capillary: 172 mg/dL — ABNORMAL HIGH (ref 70–99)

## 2019-08-05 LAB — TYPE AND SCREEN
ABO/RH(D): AB POS
Antibody Screen: NEGATIVE

## 2019-08-05 LAB — PREGNANCY, URINE: Preg Test, Ur: NEGATIVE

## 2019-08-05 SURGERY — SALPINGO-OOPHORECTOMY, ROBOT-ASSISTED
Anesthesia: General

## 2019-08-05 MED ORDER — ACETAMINOPHEN 325 MG PO TABS: 325.0000 mg | ORAL_TABLET | ORAL | Status: DC | PRN

## 2019-08-05 MED ORDER — MIDAZOLAM HCL 2 MG/2ML IJ SOLN
INTRAMUSCULAR | Status: AC
  Filled 2019-08-05: qty 2

## 2019-08-05 MED ORDER — SUCCINYLCHOLINE CHLORIDE 200 MG/10ML IV SOSY
PREFILLED_SYRINGE | INTRAVENOUS | Status: DC | PRN
  Administered 2019-08-05: 200 mg via INTRAVENOUS

## 2019-08-05 MED ORDER — LIDOCAINE HCL 2 % IJ SOLN
INTRAMUSCULAR | Status: AC
  Filled 2019-08-05: qty 20

## 2019-08-05 MED ORDER — FENTANYL CITRATE (PF) 100 MCG/2ML IJ SOLN
INTRAMUSCULAR | Status: AC
  Filled 2019-08-05: qty 2

## 2019-08-05 MED ORDER — STERILE WATER FOR IRRIGATION IR SOLN
Status: DC | PRN
  Administered 2019-08-05: 1000 mL

## 2019-08-05 MED ORDER — SCOPOLAMINE 1 MG/3DAYS TD PT72
1.0000 | MEDICATED_PATCH | TRANSDERMAL | Status: DC
  Administered 2019-08-05: 1.5 mg via TRANSDERMAL
  Filled 2019-08-05: qty 1

## 2019-08-05 MED ORDER — SUGAMMADEX SODIUM 500 MG/5ML IV SOLN
INTRAVENOUS | Status: AC
  Filled 2019-08-05: qty 5

## 2019-08-05 MED ORDER — LIDOCAINE 2% (20 MG/ML) 5 ML SYRINGE
INTRAMUSCULAR | Status: AC
  Filled 2019-08-05: qty 5

## 2019-08-05 MED ORDER — SUGAMMADEX SODIUM 200 MG/2ML IV SOLN
INTRAVENOUS | Status: DC | PRN
  Administered 2019-08-05: 300 mg via INTRAVENOUS

## 2019-08-05 MED ORDER — OXYCODONE HCL 5 MG PO TABS
5.0000 mg | ORAL_TABLET | Freq: Once | ORAL | Status: AC | PRN
  Administered 2019-08-05: 12:00:00 5 mg via ORAL

## 2019-08-05 MED ORDER — KETAMINE HCL 10 MG/ML IJ SOLN
INTRAMUSCULAR | Status: DC | PRN
  Administered 2019-08-05 (×2): 15 mg via INTRAVENOUS

## 2019-08-05 MED ORDER — BUPIVACAINE HCL 0.25 % IJ SOLN
INTRAMUSCULAR | Status: AC
  Filled 2019-08-05: qty 1

## 2019-08-05 MED ORDER — FENTANYL CITRATE (PF) 100 MCG/2ML IJ SOLN: 25.0000 ug | INTRAMUSCULAR | Status: DC | PRN

## 2019-08-05 MED ORDER — OXYCODONE HCL 5 MG PO TABS
ORAL_TABLET | ORAL | Status: AC
  Filled 2019-08-05: qty 1

## 2019-08-05 MED ORDER — ROCURONIUM BROMIDE 10 MG/ML (PF) SYRINGE
PREFILLED_SYRINGE | INTRAVENOUS | Status: DC | PRN
Start: 2019-08-05 — End: 2019-08-05
  Administered 2019-08-05: 20 mg via INTRAVENOUS
  Administered 2019-08-05: 30 mg via INTRAVENOUS
  Administered 2019-08-05: 50 mg via INTRAVENOUS
  Administered 2019-08-05: 20 mg via INTRAVENOUS

## 2019-08-05 MED ORDER — ONDANSETRON HCL 4 MG/2ML IJ SOLN
INTRAMUSCULAR | Status: AC
  Filled 2019-08-05: qty 2

## 2019-08-05 MED ORDER — DEXAMETHASONE SODIUM PHOSPHATE 10 MG/ML IJ SOLN
INTRAMUSCULAR | Status: AC
  Filled 2019-08-05: qty 1

## 2019-08-05 MED ORDER — LACTATED RINGERS IR SOLN
Status: DC | PRN
  Administered 2019-08-05: 1000 mL

## 2019-08-05 MED ORDER — HEPARIN SODIUM (PORCINE) 5000 UNIT/ML IJ SOLN
5000.0000 [IU] | INTRAMUSCULAR | Status: AC
  Administered 2019-08-05: 5000 [IU] via SUBCUTANEOUS
  Filled 2019-08-05: qty 1

## 2019-08-05 MED ORDER — SUCCINYLCHOLINE CHLORIDE 200 MG/10ML IV SOSY
PREFILLED_SYRINGE | INTRAVENOUS | Status: AC
  Filled 2019-08-05: qty 10

## 2019-08-05 MED ORDER — CELECOXIB 200 MG PO CAPS
400.0000 mg | ORAL_CAPSULE | ORAL | Status: AC
  Administered 2019-08-05: 400 mg via ORAL
  Filled 2019-08-05: qty 2

## 2019-08-05 MED ORDER — GABAPENTIN 300 MG PO CAPS
300.0000 mg | ORAL_CAPSULE | ORAL | Status: AC
  Administered 2019-08-05: 06:00:00 300 mg via ORAL
  Filled 2019-08-05: qty 1

## 2019-08-05 MED ORDER — LIDOCAINE 2% (20 MG/ML) 5 ML SYRINGE
INTRAMUSCULAR | Status: DC | PRN
  Administered 2019-08-05: 100 mg via INTRAVENOUS

## 2019-08-05 MED ORDER — LACTATED RINGERS IV SOLN: INTRAVENOUS | Status: DC

## 2019-08-05 MED ORDER — FENTANYL CITRATE (PF) 100 MCG/2ML IJ SOLN
INTRAMUSCULAR | Status: DC | PRN
  Administered 2019-08-05 (×2): 50 ug via INTRAVENOUS
  Administered 2019-08-05: 100 ug via INTRAVENOUS

## 2019-08-05 MED ORDER — MIDAZOLAM HCL 2 MG/2ML IJ SOLN
INTRAMUSCULAR | Status: DC | PRN
  Administered 2019-08-05: 2 mg via INTRAVENOUS

## 2019-08-05 MED ORDER — DEXAMETHASONE SODIUM PHOSPHATE 4 MG/ML IJ SOLN: 4.0000 mg | INTRAMUSCULAR | Status: DC

## 2019-08-05 MED ORDER — ACETAMINOPHEN 160 MG/5ML PO SOLN: 325.0000 mg | ORAL | Status: DC | PRN

## 2019-08-05 MED ORDER — KETAMINE HCL 10 MG/ML IJ SOLN
INTRAMUSCULAR | Status: AC
  Filled 2019-08-05: qty 1

## 2019-08-05 MED ORDER — ACETAMINOPHEN 500 MG PO TABS
1000.0000 mg | ORAL_TABLET | ORAL | Status: AC
  Administered 2019-08-05: 06:00:00 1000 mg via ORAL
  Filled 2019-08-05: qty 2

## 2019-08-05 MED ORDER — PROPOFOL 10 MG/ML IV BOLUS
INTRAVENOUS | Status: DC | PRN
  Administered 2019-08-05: 200 mg via INTRAVENOUS

## 2019-08-05 MED ORDER — KETOROLAC TROMETHAMINE 30 MG/ML IJ SOLN
30.0000 mg | Freq: Once | INTRAMUSCULAR | Status: AC
  Administered 2019-08-05: 30 mg via INTRAVENOUS

## 2019-08-05 MED ORDER — BUPIVACAINE HCL 0.25 % IJ SOLN
INTRAMUSCULAR | Status: DC | PRN
  Administered 2019-08-05: 33 mL

## 2019-08-05 MED ORDER — DEXAMETHASONE SODIUM PHOSPHATE 10 MG/ML IJ SOLN
INTRAMUSCULAR | Status: DC | PRN
  Administered 2019-08-05: 8 mg via INTRAVENOUS

## 2019-08-05 MED ORDER — KETOROLAC TROMETHAMINE 30 MG/ML IJ SOLN
INTRAMUSCULAR | Status: AC
  Filled 2019-08-05: qty 1

## 2019-08-05 MED ORDER — OXYCODONE HCL 5 MG/5ML PO SOLN: 5.0000 mg | Freq: Once | ORAL | Status: AC | PRN

## 2019-08-05 MED ORDER — ONDANSETRON HCL 4 MG/2ML IJ SOLN: 4.0000 mg | Freq: Once | INTRAMUSCULAR | Status: DC | PRN

## 2019-08-05 MED ORDER — PROPOFOL 10 MG/ML IV BOLUS
INTRAVENOUS | Status: AC
  Filled 2019-08-05: qty 20

## 2019-08-05 MED ORDER — MEPERIDINE HCL 50 MG/ML IJ SOLN: 6.2500 mg | INTRAMUSCULAR | Status: DC | PRN

## 2019-08-05 MED ORDER — LIDOCAINE 2% (20 MG/ML) 5 ML SYRINGE
INTRAMUSCULAR | Status: DC | PRN
  Administered 2019-08-05: 1.5 mg/kg/h via INTRAVENOUS

## 2019-08-05 MED ORDER — ONDANSETRON HCL 4 MG/2ML IJ SOLN
INTRAMUSCULAR | Status: DC | PRN
  Administered 2019-08-05: 4 mg via INTRAVENOUS

## 2019-08-05 SURGICAL SUPPLY — 66 items
APPLICATOR SURGIFLO ENDO (HEMOSTASIS) IMPLANT
BAG LAPAROSCOPIC 12 15 PORT 16 (BASKET) IMPLANT
BAG RETRIEVAL 12/15 (BASKET)
BLADE SURG SZ10 CARB STEEL (BLADE) IMPLANT
COVER BACK TABLE 60X90IN (DRAPES) ×2 IMPLANT
COVER TIP SHEARS 8 DVNC (MISCELLANEOUS) ×1 IMPLANT
COVER TIP SHEARS 8MM DA VINCI (MISCELLANEOUS) ×1
COVER WAND RF STERILE (DRAPES) IMPLANT
DECANTER SPIKE VIAL GLASS SM (MISCELLANEOUS) ×1 IMPLANT
DERMABOND ADVANCED (GAUZE/BANDAGES/DRESSINGS) ×1
DERMABOND ADVANCED .7 DNX12 (GAUZE/BANDAGES/DRESSINGS) ×1 IMPLANT
DRAPE ARM DVNC X/XI (DISPOSABLE) ×4 IMPLANT
DRAPE COLUMN DVNC XI (DISPOSABLE) ×1 IMPLANT
DRAPE DA VINCI XI ARM (DISPOSABLE) ×4
DRAPE DA VINCI XI COLUMN (DISPOSABLE) ×1
DRAPE SHEET LG 3/4 BI-LAMINATE (DRAPES) ×2 IMPLANT
DRAPE SURG IRRIG POUCH 19X23 (DRAPES) ×2 IMPLANT
DRSG OPSITE POSTOP 4X6 (GAUZE/BANDAGES/DRESSINGS) IMPLANT
DRSG OPSITE POSTOP 4X8 (GAUZE/BANDAGES/DRESSINGS) IMPLANT
DRSG TEGADERM 8X12 (GAUZE/BANDAGES/DRESSINGS) ×1 IMPLANT
ELECT REM PT RETURN 15FT ADLT (MISCELLANEOUS) ×2 IMPLANT
GLOVE BIO SURGEON STRL SZ 6 (GLOVE) ×9 IMPLANT
GLOVE BIO SURGEON STRL SZ 6.5 (GLOVE) ×2 IMPLANT
GOWN STRL REUS W/ TWL LRG LVL3 (GOWN DISPOSABLE) ×4 IMPLANT
GOWN STRL REUS W/TWL LRG LVL3 (GOWN DISPOSABLE) ×4
HOLDER FOLEY CATH W/STRAP (MISCELLANEOUS) ×2 IMPLANT
IRRIG SUCT STRYKERFLOW 2 WTIP (MISCELLANEOUS) ×2
IRRIGATION SUCT STRKRFLW 2 WTP (MISCELLANEOUS) ×1 IMPLANT
KIT PROCEDURE DA VINCI SI (MISCELLANEOUS)
KIT PROCEDURE DVNC SI (MISCELLANEOUS) IMPLANT
KIT TURNOVER KIT A (KITS) IMPLANT
MANIPULATOR UTERINE 4.5 ZUMI (MISCELLANEOUS) ×2 IMPLANT
NDL HYPO 21X1.5 SAFETY (NEEDLE) ×1 IMPLANT
NDL SPNL 18GX3.5 QUINCKE PK (NEEDLE) IMPLANT
NEEDLE HYPO 21X1.5 SAFETY (NEEDLE) ×2 IMPLANT
NEEDLE SPNL 18GX3.5 QUINCKE PK (NEEDLE) IMPLANT
OBTURATOR OPTICAL STANDARD 8MM (TROCAR) ×1
OBTURATOR OPTICAL STND 8 DVNC (TROCAR) ×1
OBTURATOR OPTICALSTD 8 DVNC (TROCAR) ×1 IMPLANT
PACK ROBOT GYN CUSTOM WL (TRAY / TRAY PROCEDURE) ×2 IMPLANT
PAD POSITIONING PINK XL (MISCELLANEOUS) ×2 IMPLANT
PENCIL SMOKE EVACUATOR (MISCELLANEOUS) IMPLANT
PORT ACCESS TROCAR AIRSEAL 12 (TROCAR) ×1 IMPLANT
PORT ACCESS TROCAR AIRSEAL 5M (TROCAR) ×1
POUCH SPECIMEN RETRIEVAL 10MM (ENDOMECHANICALS) ×2 IMPLANT
SCRUB CHG 4% DYNA-HEX 4OZ (MISCELLANEOUS) ×2 IMPLANT
SEAL CANN UNIV 5-8 DVNC XI (MISCELLANEOUS) ×3 IMPLANT
SEAL XI 5MM-8MM UNIVERSAL (MISCELLANEOUS) ×4
SET TRI-LUMEN FLTR TB AIRSEAL (TUBING) ×2 IMPLANT
SPONGE LAP 18X18 RF (DISPOSABLE) IMPLANT
SURGIFLO W/THROMBIN 8M KIT (HEMOSTASIS) IMPLANT
SUT MNCRL AB 4-0 PS2 18 (SUTURE) IMPLANT
SUT PDS AB 1 TP1 96 (SUTURE) IMPLANT
SUT VIC AB 0 CT1 27 (SUTURE)
SUT VIC AB 0 CT1 27XBRD ANTBC (SUTURE) IMPLANT
SUT VIC AB 2-0 CT1 27 (SUTURE)
SUT VIC AB 2-0 CT1 TAPERPNT 27 (SUTURE) IMPLANT
SUT VICRYL 4-0 PS2 18IN ABS (SUTURE) ×4 IMPLANT
SYR 10ML LL (SYRINGE) IMPLANT
TOWEL OR NON WOVEN STRL DISP B (DISPOSABLE) ×2 IMPLANT
TRAP SPECIMEN MUCOUS 40CC (MISCELLANEOUS) ×1 IMPLANT
TRAY FOLEY MTR SLVR 16FR STAT (SET/KITS/TRAYS/PACK) ×2 IMPLANT
TROCAR XCEL NON-BLD 5MMX100MML (ENDOMECHANICALS) IMPLANT
UNDERPAD 30X36 HEAVY ABSORB (UNDERPADS AND DIAPERS) ×2 IMPLANT
WATER STERILE IRR 1000ML POUR (IV SOLUTION) ×2 IMPLANT
YANKAUER SUCT BULB TIP 10FT TU (MISCELLANEOUS) IMPLANT

## 2019-08-05 NOTE — Interval H&P Note (Signed)
History and Physical Interval Note:  08/05/2019 6:51 AM  Doris Armstrong  has presented today for surgery, with the diagnosis of OVARAIN MASS.  The various methods of treatment have been discussed with the patient and family. After consideration of risks, benefits and other options for treatment, the patient has consented to  Procedure(s): XI ROBOTIC ASSISTED UNILATERAL   OOPHORECTOMY,BILATERAL SALPINGECTOMY, POSSIBLE TOTAL LAPAROSCOPIC HYSTERECTOMY, POSSIBLE STAGING (N/A) as a surgical intervention.  The patient's history has been reviewed, patient examined, no change in status, stable for surgery.  I have reviewed the patient's chart and labs.  Questions were answered to the patient's satisfaction.     Lafonda Mosses

## 2019-08-05 NOTE — Brief Op Note (Signed)
08/05/2019  9:23 AM  PATIENT:  Doris Armstrong  44 y.o. 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  PROCEDURE:  Procedure(s): XI ROBOTIC ASSISTED UNILATERAL   OOPHORECTOMY,BILATERAL SALPINGECTOMY, POSSIBLE TOTAL LAPAROSCOPIC HYSTERECTOMY, POSSIBLE STAGING (N/A)  SURGEON:  Surgeon(s) and Role:    Lafonda Mosses, MD - Primary    * Lahoma Crocker, MD - Assisting   ANESTHESIA:   general  EBL:  25 mL   BLOOD ADMINISTERED:none  DRAINS: none   LOCAL MEDICATIONS USED:  BUPIVICAINE   SPECIMEN:  Left adnexa (for frozen), right tube for permanent  DISPOSITION OF SPECIMEN:  PATHOLOGY  COUNTS:  YES  TOURNIQUET:  * No tourniquets in log *  DICTATION: .Note written in EPIC  PLAN OF CARE: Discharge to home after PACU  PATIENT DISPOSITION:  PACU - hemodynamically stable.   Delay start of Pharmacological VTE agent (>24hrs) due to surgical blood loss or risk of bleeding: not applicable

## 2019-08-05 NOTE — Op Note (Signed)
OPERATIVE NOTE  Pre-operative Diagnosis: Adnexal mass, normal tumor markers  Post-operative Diagnosis: Left ovarian mass, frozen section suspicious for stromal tumor  Operation: Robotic-assisted laparoscopic left oophorectomy, bilateral salpingectomy  Surgeon: Jeral Pinch MD  Assistant Surgeon: Lahoma Crocker MD (an MD assistant was necessary for tissue manipulation, management of robotic instrumentation, retraction and positioning due to the complexity of the case and hospital policies).   Anesthesia: GET  Urine Output: 75 cc  Operative Findings:  On EUA, 8 cm mobile uterus.  Fullness appreciated in the cul-de-sac that is mobile.  No nodularity on rectovaginal exam.  On intra-abdominal entry, smooth and normal-appearing liver edge, diaphragm, omentum, and stomach.  Normal-appearing small and large bowel.  No gross adenopathy.  No ascites.  Right ovary normal appearing.  Left ovary enlarged measuring 5-6 cm, white and smooth.  Bilateral fallopian tubes with some small paratubal cysts and evidence of possible salpingiosis.  Upon contained morcellation of the left ovary, 1-2 cm portion fatty in appearance and orange.  Uterus 8 cm and normal-appearing.  Small calcified nodule noted on right uterosacral ligament, excised.  Estimated Blood Loss:  Minimal      Total IV Fluids: 1,000 ml         Specimens: Left ovary, bilateral fallopian tubes         Complications:  None apparent; patient tolerated the procedure well.         Disposition: PACU - hemodynamically stable.  Procedure Details  The patient was seen in the Holding Room. The risks, benefits, complications, treatment options, and expected outcomes were discussed with the patient.  The patient concurred with the proposed plan, giving informed consent.  The site of surgery properly noted/marked. The patient was identified as Noah Delaine and the procedure verified as a Robotic-assisted hysterectomy with bilateral salpingo  oophorectomy with SLN biopsy.   After induction of anesthesia, the patient was draped and prepped in the usual sterile manner. Patient was placed in supine position after anesthesia and draped and prepped in the usual sterile manner as follows: Her arms were tucked to her side with all appropriate precautions.  The shoulders were stabilized with padded shoulder blocks applied to the acromium processes.  The patient was placed in the semi-lithotomy position in [place].  The perineum and vagina were prepped with CholoraPrep. The patient was draped after the CholoraPrep had been allowed to dry for 3 minutes.  A Time Out was held and the above information confirmed.  The urethra was prepped with Betadine. Foley catheter was placed.  A sterile speculum was placed in the vagina.  The cervix was grasped with a single-tooth tenaculum. The cervix was dilated with Kennon Rounds dilators.  The ZUMI uterine manipulator with a medium colpotomizer ring was placed without difficulty.  OG tube placement was confirmed and to suction.   Next, a 10 mm skin incision was made 1 cm below the subcostal margin in the midclavicular line.  The 5 mm Optiview port and scope was used for direct entry.  Opening pressure was under 10 mm CO2.  The abdomen was insufflated and the findings were noted as above.   At this point and all points during the procedure, the patient's intra-abdominal pressure did not exceed 15 mmHg. Next, an 8 mm skin incision was made superior to the umbilicus and a right and left port were placed about 8 cm lateral to the robot port on the right and left side.  The 5 mm assist trocar was exchanged for a 10-12  mm port. All ports were placed under direct visualization.  The patient was placed in steep Trendelenburg.  Bowel was folded away into the upper abdomen.  The robot was docked in the normal manner.  Pelvic washings were collected.  The left peritoneum were opened parallel to the IP ligament to open the  retroperitoneal spaces bilaterally.  The ureter was noted to be on the medial leaf of the broad ligament.  The peritoneum above the ureter was incised and stretched and the infundibulopelvic ligament was skeletonized, cauterized and cut.  The mesosalpinx was cauterized until just lateral to the uterus at which point the fallopian tube and utero-ovarian ligament were isolated, cauterized and transected, freeing the left adnexa.  The 10 mm bag was placed through the assist trocar and brought out to the skin.  An attempt at morcellation of the ovarian mass led to tearing of the Endo Catch bag and the decision was made to replace the bag and specimen within the abdomen and a second Endo Catch bag was inserted through the assist trocar.  Within the abdomen, the remaining ovarian mass was morcellated using monopolar electrocautery within the new Endo Catch bag, assuring no spillage into the abdomen.  Once morcellated, the Endo Catch bag was closed and again brought to the skin of the left upper quadrant incision.  The morcellated pieces were then removed using ring forceps and Kochers until the Endo Catch bag was able to be removed in its entirety.  The left adnexa was then sent to pathology for frozen section.  The assist trocar was replaced under visualization and attention was then turned to the right.  The right fallopian tube was elevated and accommodation of monopolar and bipolar electrocautery was used to cauterize and transect the fallopian tube along its length, freeing it from the mesosalpinx and ovary.  Just lateral to the uterus, the fallopian tube was cauterized and transected and the right fallopian tube was removed through the assist trocar.    Pedicles were inspected and excellent hemostasis was achieved.  The pelvis was irrigated.  Frozen section revealed a benign tumor, likely stromal tumor.At this point in the procedure was completed.  Robotic instruments were removed under direct visulaization.   The robot was undocked. The fascia at the 10-12 mm port was closed with 0 Vicryl on a UR-5 needle.  The subcuticular tissue was closed with 4-0 Vicryl and the skin was closed with 4-0 Monocryl in a subcuticular manner.  Dermabond was applied.    The uterine manipulator was removed and the vagina was swabbed with  minimal bleeding noted.   The Foley catheter was also removed.  All sponge, lap and needle counts were correct x  3.   The patient was transferred to the recovery room in stable condition.  Jeral Pinch, MD

## 2019-08-05 NOTE — Discharge Instructions (Signed)
08/05/2019  Return to work: 4-6 weeks  Activity: 1. Be up and out of the bed during the day.  Take a nap if needed.  You may walk up steps but be careful and use the hand rail.  Stair climbing will tire you more than you think, you may need to stop part way and rest.   2. No lifting or straining for 6 weeks.  3. No driving for 4-7 days.  Do Not drive if you are taking narcotic pain medicine and until you feel that you can brake safely.  4. Shower daily.  Use soap and water on your incision and pat dry; don't rub.   5. No sexual activity and nothing in the vagina for 4-6 weeks.  Medications:  - Take ibuprofen and tylenol first line for pain control. Take these regularly (every 6 hours) to decrease the build up of pain.  - If necessary, for severe pain not relieved by ibuprofen, take percocet.  - While taking percocet you should take sennakot every night to reduce the likelihood of constipation. If this causes diarrhea, stop its use.  Diet: 1. Low sodium Heart Healthy Diet is recommended.  2. It is safe to use a laxative if you have difficulty moving your bowels.   Wound Care: 1. Keep clean and dry.  Shower daily.  Reasons to call the Doctor:   Fever - Oral temperature greater than 100.4 degrees Fahrenheit  Foul-smelling vaginal discharge  Difficulty urinating  Nausea and vomiting  Increased pain at the site of the incision that is unrelieved with pain medicine.  Difficulty breathing with or without chest pain  New calf pain especially if only on one side  Sudden, continuing increased vaginal bleeding with or without clots.   Follow-up: 1. See Jeral Pinch in 3 weeks. Phone visit next week to review pathology.  Contacts: For questions or concerns you should contact:  Dr. Jeral Pinch at [phone] After hours and on week-ends call [phone] and ask to speak to the physician on call for Gynecologic Oncology

## 2019-08-05 NOTE — Transfer of Care (Signed)
Immediate Anesthesia Transfer of Care Note  Patient: Doris Armstrong  Procedure(s) Performed: XI ROBOTIC ASSISTED UNILATERAL   OOPHORECTOMY,BILATERAL SALPINGECTOMY (N/A )  Patient Location: PACU  Anesthesia Type:General  Level of Consciousness: awake  Airway & Oxygen Therapy: Patient Spontanous Breathing and Patient connected to face mask oxygen  Post-op Assessment: Report given to RN, Post -op Vital signs reviewed and stable and Patient moving all extremities X 4  Post vital signs: Reviewed and stable  Last Vitals:  Vitals Value Taken Time  BP    Temp    Pulse 75 08/05/19 0937  Resp 14 08/05/19 0937  SpO2 98 % 08/05/19 0937  Vitals shown include unvalidated device data.  Last Pain:  Vitals:   08/05/19 0602  TempSrc:   PainSc: 0-No pain         Complications: No apparent anesthesia complications

## 2019-08-05 NOTE — Anesthesia Postprocedure Evaluation (Signed)
Anesthesia Post Note  Patient: Doris Armstrong  Procedure(s) Performed: XI ROBOTIC ASSISTED UNILATERAL   OOPHORECTOMY,BILATERAL SALPINGECTOMY (N/A )     Patient location during evaluation: PACU Anesthesia Type: General Level of consciousness: awake and alert Pain management: pain level controlled Vital Signs Assessment: post-procedure vital signs reviewed and stable Respiratory status: spontaneous breathing, nonlabored ventilation, respiratory function stable and patient connected to nasal cannula oxygen Cardiovascular status: blood pressure returned to baseline and stable Postop Assessment: no apparent nausea or vomiting Anesthetic complications: no    Last Vitals:  Vitals:   08/05/19 1115 08/05/19 1219  BP: (!) 141/97 (!) 145/96  Pulse: 81 83  Resp: 14 18  Temp: 36.6 C 36.6 C  SpO2: 95%     Last Pain:  Vitals:   08/05/19 1219  TempSrc:   PainSc: 6                  [name]

## 2019-08-05 NOTE — Anesthesia Procedure Notes (Signed)
Procedure Name: Intubation Date/Time: 08/05/2019 7:21 AM Performed by: Niel Hummer, CRNA Pre-anesthesia Checklist: Patient identified, Emergency Drugs available, Suction available and Patient being monitored Patient Re-evaluated:Patient Re-evaluated prior to induction Oxygen Delivery Method: Circle system utilized Preoxygenation: Pre-oxygenation with 100% oxygen Induction Type: IV induction and Rapid sequence Laryngoscope Size: Mac and 4 Grade View: Grade I Tube type: Oral Tube size: 7.0 mm Number of attempts: 1 Airway Equipment and Method: Stylet Placement Confirmation: ETT inserted through vocal cords under direct vision,  positive ETCO2 and breath sounds checked- equal and bilateral Secured at: 22 cm Tube secured with: Tape Dental Injury: Teeth and Oropharynx as per pre-operative assessment

## 2019-08-06 ENCOUNTER — Telehealth: Payer: Self-pay

## 2019-08-06 LAB — CYTOLOGY - NON PAP

## 2019-08-06 NOTE — Telephone Encounter (Signed)
Ms Mewhinney states she is doing well. She is eating, drinking , and urinating well. Abdomen sore but the ibuprofen effective. Suggested adding tylenol 500 mg alt with Ibuprofen. Passing gas. Suggested that she increase the senokot-s to 2 tabs bid. If no BM by 2-13-21miday, she can use Miralax 1 capful bid in addition to the senokot-s. Incisions D&I. The upper port incision was oozing a little last night. She applied a bandaid  To the corner and the oozing stooped. She is aware of her appointments  And knows to call [phone] if any questions or concerns.

## 2019-08-09 LAB — SURGICAL PATHOLOGY

## 2019-08-11 ENCOUNTER — Inpatient Hospital Stay: Payer: 59 | Attending: Gynecologic Oncology | Admitting: Gynecologic Oncology

## 2019-08-11 ENCOUNTER — Encounter: Payer: Self-pay | Admitting: Gynecologic Oncology

## 2019-08-11 ENCOUNTER — Inpatient Hospital Stay: Payer: 59

## 2019-08-11 ENCOUNTER — Other Ambulatory Visit: Payer: Self-pay

## 2019-08-11 DIAGNOSIS — Z791 Long term (current) use of non-steroidal anti-inflammatories (NSAID): Secondary | ICD-10-CM | POA: Diagnosis not present

## 2019-08-11 DIAGNOSIS — Z801 Family history of malignant neoplasm of trachea, bronchus and lung: Secondary | ICD-10-CM | POA: Insufficient documentation

## 2019-08-11 DIAGNOSIS — Z793 Long term (current) use of hormonal contraceptives: Secondary | ICD-10-CM | POA: Insufficient documentation

## 2019-08-11 DIAGNOSIS — Z803 Family history of malignant neoplasm of breast: Secondary | ICD-10-CM | POA: Diagnosis not present

## 2019-08-11 DIAGNOSIS — Z7984 Long term (current) use of oral hypoglycemic drugs: Secondary | ICD-10-CM | POA: Diagnosis not present

## 2019-08-11 DIAGNOSIS — E282 Polycystic ovarian syndrome: Secondary | ICD-10-CM | POA: Diagnosis not present

## 2019-08-11 DIAGNOSIS — D3912 Neoplasm of uncertain behavior of left ovary: Secondary | ICD-10-CM

## 2019-08-11 DIAGNOSIS — Z87891 Personal history of nicotine dependence: Secondary | ICD-10-CM | POA: Diagnosis not present

## 2019-08-11 DIAGNOSIS — E119 Type 2 diabetes mellitus without complications: Secondary | ICD-10-CM | POA: Insufficient documentation

## 2019-08-11 DIAGNOSIS — I10 Essential (primary) hypertension: Secondary | ICD-10-CM | POA: Insufficient documentation

## 2019-08-11 DIAGNOSIS — Z9079 Acquired absence of other genital organ(s): Secondary | ICD-10-CM | POA: Diagnosis not present

## 2019-08-11 DIAGNOSIS — Z79899 Other long term (current) drug therapy: Secondary | ICD-10-CM | POA: Insufficient documentation

## 2019-08-11 LAB — CORTISOL: Cortisol, Plasma: 7.6 ug/dL

## 2019-08-11 LAB — LACTATE DEHYDROGENASE: LDH: 182 U/L (ref 98–192)

## 2019-08-11 NOTE — Progress Notes (Addendum)
Gynecologic Oncology Telehealth Consult Note: Gyn-Onc  I connected with Doris Armstrong on 08/11/19 at 13:40 EST by telephone and verified that I am speaking with the correct person using two identifiers.  I discussed the limitations, risks, security and privacy concerns of performing an evaluation and management service by telemedicine and the availability of in-person appointments. I also discussed with the patient that there may be a patient responsible charge related to this service. The patient expressed understanding and agreed to proceed.  Other persons participating in the visit and their role in the encounter: none.  Patient's location: Her car Provider's location: [facility] [place]  Reason for Visit: Review of pathology after recent adnexal surgery and treatment planning  Interval History: Patient reports overall healing well from surgery.  She endorses normal bowel and bladder function.  She still has a mildly decreased appetite but denies any nausea or emesis.  She has had some abdominal pain related to movement and feeling like she has overdone it.  She still has some "swelling".  She has had minimal drainage from her left upper incision for which she uses a bandage during the day.  She denies any fevers or chills.  Past Medical/Surgical History: Past Medical History:  Diagnosis Date  . Adnexal mass   . Diabetes mellitus    type 2  . Diabetes mellitus without complication ([place])   . Gall bladder stones   . History of infection due to human papilloma virus (HPV) 2019   per patient , now resolved   . Hypertension   . Obesity   . PCOS (polycystic ovarian syndrome)   . Personal history of cervical dysplasia     Past Surgical History:  Procedure Laterality Date  . CHOLECYSTECTOMY N/A 12/05/2018   Procedure: LAPAROSCOPIC CHOLECYSTECTOMY WITH INTRAOPERATIVE CHOLANGIOGRAM;  Surgeon: Alphonsa Overall, MD;  Location: [facility];  Service: General;  Laterality: N/A;  . KNEE  ARTHROSCOPY Right 1993  . ROBOTIC ASSISTED SALPINGO OOPHERECTOMY N/A 08/05/2019   Procedure: XI ROBOTIC ASSISTED UNILATERAL   OOPHORECTOMY,BILATERAL SALPINGECTOMY;  Surgeon: Lafonda Mosses, MD;  Location: [facility];  Service: Gynecology;  Laterality: N/A;    Family History  Problem Relation Age of Onset  . Breast cancer Paternal Grandmother   . Lung cancer Paternal Grandfather   . Ovarian cancer Neg Hx   . Endometrial cancer Neg Hx     Social History   Socioeconomic History  . Marital status: Single    Spouse name: Not on file  . Number of children: 0  . Years of education: Not on file  . Highest education level: Not on file  Occupational History  . Not on file  Tobacco Use  . Smoking status: Former Smoker    Quit date: 07/29/2017    Years since quitting: 2.0  . Smokeless tobacco: Never Used  Substance and Sexual Activity  . Alcohol use: Yes    Comment: 1 bottle of wine each night   . Drug use: No  . Sexual activity: Yes    Birth control/protection: None  Other Topics Concern  . Not on file  Social History Narrative   ** Merged History Encounter **       Social Determinants of Health   Financial Resource Strain:   . Difficulty of Paying Living Expenses: Not on file  Food Insecurity:   . Worried About Charity fundraiser in the Last Year: Not on file  . Ran Out of Food in the Last Year: Not on file  Transportation Needs:   . Film/video editor (Medical): Not on file  . Lack of Transportation (Non-Medical): Not on file  Physical Activity:   . Days of Exercise per Week: Not on file  . Minutes of Exercise per Session: Not on file  Stress:   . Feeling of Stress : Not on file  Social Connections:   . Frequency of Communication with Friends and Family: Not on file  . Frequency of Social Gatherings with Friends and Family: Not on file  . Attends Religious Services: Not on file  . Active Member of Clubs or Organizations: Not on file  . Attends Theatre manager Meetings: Not on file  . Marital Status: Not on file    Current Medications:  Current Outpatient Medications:  .  BLACK ELDERBERRY PO, Take 2 tablets by mouth 2 (two) times daily., Disp: , Rfl:  .  ibuprofen (ADVIL) 800 MG tablet, Take 1 tablet (800 mg total) by mouth every 8 (eight) hours as needed. For AFTER surgery only, Disp: 30 tablet, Rfl: 0 .  losartan-hydrochlorothiazide (HYZAAR) 50-12.5 MG tablet, Take 1 tablet by mouth daily., Disp: 90 tablet, Rfl: 1 .  medroxyPROGESTERone (PROVERA) 10 MG tablet, Take 10 mg by mouth daily., Disp: , Rfl:  .  metFORMIN (GLUCOPHAGE) 500 MG tablet, Take one tablet with lunch and 2 tablets with dinner (Patient taking differently: Take 500-1,000 mg by mouth See admin instructions. Take 500 mg by mouth with lunch and 1000 mg with dinner), Disp: 180 tablet, Rfl: 1 .  Multiple Vitamin (MULTIVITAMIN WITH MINERALS) TABS tablet, Take 1 tablet by mouth daily., Disp: , Rfl:  .  oxyCODONE (OXY IR/ROXICODONE) 5 MG immediate release tablet, Take 1 tablet (5 mg total) by mouth every 4 (four) hours as needed for severe pain. For AFTER surgery, do not take and drive, Disp: 15 tablet, Rfl: 0 .  senna-docusate (SENOKOT-S) 8.6-50 MG tablet, Take 2 tablets by mouth at bedtime. For AFTER surgery, do not take if having diarrhea, Disp: 30 tablet, Rfl: 1  Review of Symptoms: Pertinent positives as per HPI.  Otherwise review of systems negative.  Physical Exam: LMP 07/23/2019  Not performed given limitations of telephone visit  Laboratory & Radiologic Studies: FINAL MICROSCOPIC DIAGNOSIS:   A. 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  - See comment.  - Benign Fallopian tube with benign paratubal cyst.   B. FALLOPIAN TUBE, RIGHT, SALPINGECTOMY:  - Benign Fallopian tube with benign paratubal cyst.  - No endometriosis or malignancy.   C. URETEROSACRAL LIGAMENT  IMPLANT, RIGHT, BIOPSY:  - Peritoneum with stromal calcifications.  - See comment.  - No evidence of malignancy.   COMMENT:  A. The fragments of left ovary are extensively involved by sheets of  cells with abundant clear to foamy cytoplasm and small round to oval,  uniform nuclei. There is minimal atypia and mitotic activity is not  identified. Immunohistochemistry shows the lesion is positive with  inhibin and negative with cytokeratin AE1/AE3,EMA, CD10 and PAX 8. The  morphology and immunophenotype are most consistent with steroid cell  tumor, not otherwise specified.  Assessment & Plan: Doris Armstrong is a 44 y.o. 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  Patient is overall healing well from surgery.  We discussed final pathology results showing a rare ovarian tumor within the sex cord stromal cell tumor group.  I  spoke with one of the 2 pathologist who reviewed the case this morning and the tumor lacks most of the defined malignant features including atypia, mitotic activity, necrosis and hemorrhage.  The entire ovary itself was at most 6 cm and the portion that appeared consistent with the cyst steroid cell tumor was at most 4 cm.  5 pathologic features that have been highly associated with malignancy described in the literature for this rare ovarian tumor include the 4 previously mentioned features and tumor diameter more than 7 cm.  Given that the patient lacks any of the 5 of these pathologic characteristics and that her tumor is bland appearing, it is favored that this is not a malignant tumor.  Given the tumor cell type, I recommend that we get some lab work including steroids and hormone levels.  The patient is able to come in today to have these drawn.  We will plan to get a CT scan once the patient heals from surgery to rule out any evidence of metastatic disease.  As long as her CT is normal, then we will plan for  close surveillance.  I discussed the assessment and treatment plan with the patient. The patient was provided with an opportunity to ask questions and all were answered. The patient agreed with the plan and demonstrated an understanding of the instructions.   The patient was advised to call back or see an in-person evaluation if the symptoms worsen or if the condition fails to improve as anticipated.   15 minutes of total time was spent for this patient encounter, including preparation, face-to-face counseling with the patient and coordination of care, and documentation of the encounter.   Jeral Pinch, MD  Division of Gynecologic Oncology  Department of Obstetrics and Gynecology  [facility]

## 2019-08-12 LAB — TESTOSTERONE: Testosterone: 13 ng/dL (ref 8–48)

## 2019-08-12 LAB — FOLLICLE STIMULATING HORMONE: FSH: 11.1 m[IU]/mL

## 2019-08-12 LAB — LUTEINIZING HORMONE: LH: 6.1 m[IU]/mL

## 2019-08-12 LAB — ESTRADIOL: Estradiol: 32.9 pg/mL

## 2019-08-16 ENCOUNTER — Other Ambulatory Visit: Payer: Self-pay | Admitting: Oncology

## 2019-08-16 ENCOUNTER — Telehealth: Payer: Self-pay | Admitting: Oncology

## 2019-08-16 DIAGNOSIS — D3912 Neoplasm of uncertain behavior of left ovary: Secondary | ICD-10-CM

## 2019-08-16 NOTE — Progress Notes (Signed)
Gynecologic Oncology Multi-Disciplinary Disposition Conference Note  Date of the Conference: 08/16/2019  Patient Name: Doris Armstrong  Referring Provider: Dr. Nolon Rod Primary GYN Oncologist: Dr. 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  Disposition is to imaging and close surveillance.   This Multidisciplinary conference took place involving physicians from [place], [place], Radiation Oncology, Pathology, Radiology along with the Gynecologic Oncology Nurse Practitioner and RN.  Comprehensive assessment of the patient's malignancy, staging, need for surgery, chemotherapy, radiation therapy, and need for further testing were reviewed. Supportive measures, both inpatient and following discharge were also discussed. The recommended plan of care is documented. Greater than 35 minutes were spent correlating and coordinating this patient's care.

## 2019-08-16 NOTE — Telephone Encounter (Signed)
Called Doris Armstrong and discussed recommendation for genetic counseling.  She said she would be interested.  Scheduled an appointment for 08/26/19 at 11 am.

## 2019-08-17 NOTE — Telephone Encounter (Signed)
Please let the patient know that I saw that her blood pressure was good during the time she was getting surgery.  I would like to set up a visit to see if her blood pressure has just overall improved.  Can we make her a visit for hypertension and medication adjustment?

## 2019-08-18 NOTE — Telephone Encounter (Signed)
Called patient and scheduled them

## 2019-08-19 ENCOUNTER — Telehealth: Payer: Self-pay | Admitting: *Deleted

## 2019-08-19 NOTE — Telephone Encounter (Signed)
Patient called and left a message asking "when can I take a bath and/or submerge in water. My surgery was 2/11. Message forwarded to [facility] APP

## 2019-08-20 ENCOUNTER — Telehealth: Payer: Self-pay

## 2019-08-20 NOTE — Telephone Encounter (Signed)
Told Ms Dowler that she can take a bath if her incisions are well healed starting March 14 th per Joylene John, NP. Pt verbalized understanding.

## 2019-08-22 ENCOUNTER — Other Ambulatory Visit: Payer: Self-pay | Admitting: Family Medicine

## 2019-08-23 ENCOUNTER — Telehealth: Payer: Self-pay | Admitting: Licensed Clinical Social Worker

## 2019-08-23 NOTE — Telephone Encounter (Signed)
Called patient regarding upcoming virtual visit, patient has been called and aware.

## 2019-08-23 NOTE — Progress Notes (Signed)
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   Overall, she continues to do well since surgery.  She occasionally has some soreness and what she describes as "swelling" in her abdomen.  This is infrequently associated with nausea.  She denies any emesis.  She uses ibuprofen when she gets the abdominal soreness, which helps alleviate this symptom.  She endorses normal bowel and bladder function.  She denies any fevers or chills.  Denies any issues with her incisions.  Past Medical/Surgical History: Past Medical History:  Diagnosis Date  . Adnexal mass   . Diabetes mellitus    type 2  . Diabetes mellitus without complication ([place])   . Family history of breast cancer   . Family history of colon cancer   . Family history of lung cancer   . Family history of pancreatic cancer   . Gall bladder stones   . History of infection due to human papilloma virus (HPV) 2019   per patient , now resolved   . Hypertension   . Obesity   . PCOS (polycystic ovarian syndrome)   . Personal history of cervical dysplasia     Past Surgical History:  Procedure Laterality Date  . CHOLECYSTECTOMY N/A 12/05/2018   Procedure: LAPAROSCOPIC CHOLECYSTECTOMY WITH INTRAOPERATIVE CHOLANGIOGRAM;  Surgeon: Alphonsa Overall, MD;  Location: [facility];  Service: General;  Laterality: N/A;  . KNEE ARTHROSCOPY Right 1993  . ROBOTIC ASSISTED SALPINGO OOPHERECTOMY N/A 08/05/2019   Procedure: XI ROBOTIC ASSISTED UNILATERAL   OOPHORECTOMY,BILATERAL SALPINGECTOMY;  Surgeon: Lafonda Mosses, MD;  Location: [facility];  Service: Gynecology;  Laterality: N/A;    Family History  Problem Relation Age of Onset  . Breast cancer Paternal Grandmother 78  .  Lung cancer Paternal Grandfather 18  . Pancreatic cancer Maternal Aunt 67  . Colon cancer Paternal Aunt 79  . Breast cancer Maternal Aunt 70  . Cancer Paternal Aunt        unk type  . Ovarian cancer Neg Hx   . Endometrial cancer Neg Hx     Social History   Socioeconomic History  . Marital status: Single    Spouse name: Not on file  . Number of children: 0  . Years of education: Not on file  . Highest education level: Not on file  Occupational History  . Not on file  Tobacco Use  . Smoking status: Former Smoker    Quit date: 07/29/2017    Years since quitting: 2.0  . Smokeless tobacco: Never Used  Substance and Sexual Activity  . Alcohol use: Yes    Comment: 1 bottle of wine each night   . Drug use: No  . Sexual activity: Yes    Birth control/protection: None  Other Topics Concern  . Not on file  Social History Narrative   ** Merged History Encounter **       Social Determinants of Health   Financial Resource Strain:   . Difficulty of Paying Living Expenses: Not on file  Food Insecurity:   . Worried About Charity fundraiser in the Last Year: Not on file  . Ran Out of Food in the Last Year: Not on file  Transportation Needs:   . Lack of Transportation (Medical): Not on file  .  Lack of Transportation (Non-Medical): Not on file  Physical Activity:   . Days of Exercise per Week: Not on file  . Minutes of Exercise per Session: Not on file  Stress:   . Feeling of Stress : Not on file  Social Connections:   . Frequency of Communication with Friends and Family: Not on file  . Frequency of Social Gatherings with Friends and Family: Not on file  . Attends Religious Services: Not on file  . Active Member of Clubs or Organizations: Not on file  . Attends Archivist Meetings: Not on file  . Marital Status: Not on file    Current Medications:  Current Outpatient Medications:  .  BLACK ELDERBERRY PO, Take 2 tablets by mouth 2 (two) times daily., Disp: , Rfl:   .  losartan-hydrochlorothiazide (HYZAAR) 50-12.5 MG tablet, Take 1 tablet by mouth daily., Disp: 90 tablet, Rfl: 1 .  metFORMIN (GLUCOPHAGE) 500 MG tablet, TAKE 1 TABLET BY MOUTH WITH LUNCH AND 2 TABLETS WITH  DINNER, Disp: 270 tablet, Rfl: 0  Review of Systems: Pertinent positives as per HPI Denies appetite changes, fevers, chills, fatigue, unexplained weight changes. Denies hearing loss, neck lumps or masses, mouth sores, ringing in ears or voice changes. Denies cough or wheezing.  Denies shortness of breath. Denies chest pain or palpitations. Denies leg swelling. Denies abdominal distention, pain, blood in stools, constipation, diarrhea, nausea, vomiting, or early satiety. Denies pain with intercourse, dysuria, frequency, hematuria or incontinence. Denies hot flashes, pelvic pain, vaginal bleeding or vaginal discharge.   Denies joint pain, back pain or muscle pain/cramps. Denies itching, rash, or wounds. Denies dizziness, headaches, numbness or seizures. Denies swollen lymph nodes or glands, denies easy bruising or bleeding. Denies anxiety, depression, confusion, or decreased concentration.  Physical Exam: BP 131/83 (BP Location: Right Arm, Patient Position: Sitting)   Pulse 88   Temp 98.3 F (36.8 C) (Temporal)   Wt 300 lb (136.1 kg)   SpO2 98%   BMI 40.69 kg/m  General: Alert, oriented, no acute distress. HEENT: Normocephalic, atraumatic, sclera anicteric. Chest: Unlabored breathing on room air. Abdomen: Obese, soft, nontender.  Normoactive bowel sounds.  No masses or hepatosplenomegaly appreciated.  Well-healed laparoscopic incisions, with area of scar tissue versus hematoma (both measuring less than 2 cm) near the left lateral and supraumbilical incisions, mildly painful with palpation. Extremities: Grossly normal range of motion.  Warm, well perfused.  No edema bilaterally. GU: Deferred.  Laboratory & Radiologic Studies: A. OVARY AND FALLOPIAN TUBE, LEFT,  SLPINGECTOMY-OOPHORECTOMY:  - Stromal tumor consistent with steroid cell tumor, not otherwise  specified.  - See comment.  - Benign Fallopian tube with benign paratubal cyst.   B. FALLOPIAN TUBE, RIGHT, SALPINGECTOMY:  - Benign Fallopian tube with benign paratubal cyst.  - No endometriosis or malignancy.   C. URETEROSACRAL LIGAMENT IMPLANT, RIGHT, BIOPSY:  - Peritoneum with stromal calcifications.  - See comment.  - No evidence of malignancy.   COMMENT:  A. The fragments of left ovary are extensively involved by sheets of  cells with abundant clear to foamy cytoplasm and small round to oval,  uniform nuclei. There is minimal atypia and mitotic activity is not  identified. Immunohistochemistry shows the lesion is positive with  inhibin and negative with cytokeratin AE1/AE3,EMA, CD10 and PAX 8. The  morphology and immunophenotype are most consistent with steroid cell  tumor, not otherwise specified.  Assessment & Plan: Doris Armstrong is a 44 y.o. 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  Patient is overall healing well from surgery.    I suspect that her to periincisional areas of tenderness are either scar tissue or small resolving hematomas.    We discussed final pathology results again.  I reviewed that based on the review by pathology, they felt that this was a bland tumor with low malignant potential.  Even so, I recommend that we continue with surveillance as if it were a cancer diagnosis.    We got a number of lab tests about a week after surgery including estradiol, LDH, FSH, LH, testosterone and cortisol.  All of these were normal.  We discussed that she may have some changes if the steroid cell tumor, NOS was producing hormones and these could include change in her blood pressure, diabetes, hair growth patterns.  Have asked her to keep a diary of any such changes she notices between now and her next  appointment.  In terms of the borderline enlarged para-aortic lymph node noted on CT scan in January, I recommend that we follow this with surveillance imaging.  I had like to get a CT scan when I see her back in 3 months.  If the lymph node is stable or normal-appearing, we will not pursue further surveillance imaging.  If it appears to be growing in size, then we will continue with surveillance or discuss whether the patient needs to have this area biopsied or resected.  15 minutes of total time was spent for this patient encounter, including preparation, face-to-face counseling with the patient and coordination of care, and documentation of the encounter.  Jeral Pinch, MD  Division of Gynecologic Oncology  Department of Obstetrics and Gynecology  [facility]

## 2019-08-25 ENCOUNTER — Ambulatory Visit: Payer: 59 | Admitting: Family Medicine

## 2019-08-25 ENCOUNTER — Other Ambulatory Visit: Payer: Self-pay

## 2019-08-25 VITALS — BP 120/80 | HR 82 | Temp 98.2°F | Resp 16 | Ht 72.0 in | Wt 309.0 lb

## 2019-08-25 DIAGNOSIS — Z135 Encounter for screening for eye and ear disorders: Secondary | ICD-10-CM | POA: Diagnosis not present

## 2019-08-25 DIAGNOSIS — I10 Essential (primary) hypertension: Secondary | ICD-10-CM | POA: Diagnosis not present

## 2019-08-25 DIAGNOSIS — Z23 Encounter for immunization: Secondary | ICD-10-CM | POA: Diagnosis not present

## 2019-08-25 DIAGNOSIS — E1165 Type 2 diabetes mellitus with hyperglycemia: Secondary | ICD-10-CM

## 2019-08-25 NOTE — Patient Instructions (Addendum)
If your blood pressure is staying above 150/90 then add an evening dose of the losartan/hctz 50-12.5mg .  If the blood pressure is more consistently elevated above 150/90 consider increasing your dose to two pills in the morning and see if the blood pressure improves.  Notify our office of the results.     If you have lab work done today you will be contacted with your lab results within the next 2 weeks.  If you have not heard from [place] then please contact us. The fastest way to get your results is to register for My Chart.   IF you received an x-ray today, you will receive an invoice from [facility]. Please contact [facility] at [phone] with questions or concerns regarding your invoice.   IF you received labwork today, you will receive an invoice from [place]. Please contact [facility] at [phone] with questions or concerns regarding your invoice.   Our billing staff will not be able to assist you with questions regarding bills from these companies.  You will be contacted with the lab results as soon as they are available. The fastest way to get your results is to activate your My Chart account. Instructions are located on the last page of this paperwork. If you have not heard from [place] regarding the results in 2 weeks, please contact this office.     Managing Your Hypertension Hypertension is commonly called high blood pressure. This is when the force of your blood pressing against the walls of your arteries is too strong. Arteries are blood vessels that carry blood from your heart throughout your body. Hypertension forces the heart to work harder to pump blood, and may cause the arteries to become narrow or stiff. Having untreated or uncontrolled hypertension can cause heart attack, stroke, kidney disease, and other problems. What are blood pressure readings? A blood pressure reading consists of a higher number over a lower number. Ideally, your blood pressure should  be below 120/80. The first ("top") number is called the systolic pressure. It is a measure of the pressure in your arteries as your heart beats. The second ("bottom") number is called the diastolic pressure. It is a measure of the pressure in your arteries as the heart relaxes. What does my blood pressure reading mean? Blood pressure is classified into four stages. Based on your blood pressure reading, your health care provider may use the following stages to determine what type of treatment you need, if any. Systolic pressure and diastolic pressure are measured in a unit called mm Hg. Normal  Systolic pressure: below 123456.  Diastolic pressure: below 80. Elevated  Systolic pressure: [id number].  Diastolic pressure: below 80. Hypertension stage 1  Systolic pressure: 0000000.  Diastolic pressure: [id number]. Hypertension stage 2  Systolic pressure: [id number] or above.  Diastolic pressure: 90 or above. What health risks are associated with hypertension? Managing your hypertension is an important responsibility. Uncontrolled hypertension can lead to:  A heart attack.  A stroke.  A weakened blood vessel (aneurysm).  Heart failure.  Kidney damage.  Eye damage.  Metabolic syndrome.  Memory and concentration problems. What changes can I make to manage my hypertension? Hypertension can be managed by making lifestyle changes and possibly by taking medicines. Your health care provider will help you make a plan to bring your blood pressure within a normal range. Eating and drinking   Eat a diet that is high in fiber and potassium, and low in salt (sodium), added sugar, and fat. An example eating  plan is called the DASH (Dietary Approaches to Stop Hypertension) diet. To eat this way: ? Eat plenty of fresh fruits and vegetables. Try to fill half of your plate at each meal with fruits and vegetables. ? Eat whole grains, such as whole wheat pasta, brown rice, or whole grain bread. Fill about one  quarter of your plate with whole grains. ? Eat low-fat diary products. ? Avoid fatty cuts of meat, processed or cured meats, and poultry with skin. Fill about one quarter of your plate with lean proteins such as fish, chicken without skin, beans, eggs, and tofu. ? Avoid premade and processed foods. These tend to be higher in sodium, added sugar, and fat.  Reduce your daily sodium intake. Most people with hypertension should eat less than 1,500 mg of sodium a day.  Limit alcohol intake to no more than 1 drink a day for nonpregnant women and 2 drinks a day for men. One drink equals 12 oz of beer, 5 oz of wine, or 1 oz of hard liquor. Lifestyle  Work with your health care provider to maintain a healthy body weight, or to lose weight. Ask what an ideal weight is for you.  Get at least 30 minutes of exercise that causes your heart to beat faster (aerobic exercise) most days of the week. Activities may include walking, swimming, or biking.  Include exercise to strengthen your muscles (resistance exercise), such as weight lifting, as part of your weekly exercise routine. Try to do these types of exercises for 30 minutes at least 3 days a week.  Do not use any products that contain nicotine or tobacco, such as cigarettes and e-cigarettes. If you need help quitting, ask your health care provider.  Control any long-term (chronic) conditions you have, such as high cholesterol or diabetes. Monitoring  Monitor your blood pressure at home as told by your health care provider. Your personal target blood pressure may vary depending on your medical conditions, your age, and other factors.  Have your blood pressure checked regularly, as often as told by your health care provider. Working with your health care provider  Review all the medicines you take with your health care provider because there may be side effects or interactions.  Talk with your health care provider about your diet, exercise habits,  and other lifestyle factors that may be contributing to hypertension.  Visit your health care provider regularly. Your health care provider can help you create and adjust your plan for managing hypertension. Will I need medicine to control my blood pressure? Your health care provider may prescribe medicine if lifestyle changes are not enough to get your blood pressure under control, and if:  Your systolic blood pressure is 130 or higher.  Your diastolic blood pressure is 80 or higher. Take medicines only as told by your health care provider. Follow the directions carefully. Blood pressure medicines must be taken as prescribed. The medicine does not work as well when you skip doses. Skipping doses also puts you at risk for problems. Contact a health care provider if:  You think you are having a reaction to medicines you have taken.  You have repeated (recurrent) headaches.  You feel dizzy.  You have swelling in your ankles.  You have trouble with your vision. Get help right away if:  You develop a severe headache or confusion.  You have unusual weakness or numbness, or you feel faint.  You have severe pain in your chest or abdomen.  You vomit repeatedly.  You have trouble breathing. Summary  Hypertension is when the force of blood pumping through your arteries is too strong. If this condition is not controlled, it may put you at risk for serious complications.  Your personal target blood pressure may vary depending on your medical conditions, your age, and other factors. For most people, a normal blood pressure is less than 120/80.  Hypertension is managed by lifestyle changes, medicines, or both. Lifestyle changes include weight loss, eating a healthy, low-sodium diet, exercising more, and limiting alcohol. This information is not intended to replace advice given to you by your health care provider. Make sure you discuss any questions you have with your health care  provider. Document Revised: 10/02/2018 Document Reviewed: 05/08/2016 Elsevier Patient Education  [place].

## 2019-08-25 NOTE — Progress Notes (Signed)
Established Patient Office Visit  Subjective:  Patient ID: Doris Armstrong, female    DOB: 1975/08/25  Age: 44 y.o. MRN: [id number]  CC:  Chief Complaint  Patient presents with  . Hypertension    pt denies physical symptoms, pt does not check BP regularly only when feels it may be high    HPI Doris Armstrong presents for   Hypertension: Patient here for follow-up of elevated blood pressure. She is not exercising and is adherent to low salt diet.  Blood pressure is not well controlled at home. Cardiac symptoms none. Patient denies chest pain, claudication, exertional chest pressure/discomfort and irregular heart beat.  Cardiovascular risk factors: diabetes mellitus and hypertension. Use of agents associated with hypertension: none. History of target organ damage: none. BP Readings from Last 3 Encounters:  08/25/19 120/80  08/05/19 (!) 145/96  07/30/19 (!) 132/99   Patient reports that sometimes her bp is all over the place.    Diabetes Mellitus: Patient presents for follow up of diabetes. Symptoms: none. Symptoms have stabilized. Patient denies foot ulcerations, hyperglycemia, hypoglycemia , increase appetite and paresthesia of the feet.  Evaluation to date has been included: hemoglobin A1C.  Home sugars: patient does not check sugars. Treatment to date: Continued metformin which has been effective.  Lab Results  Component Value Date   HGBA1C 6.8 (H) 04/09/2019     Past Medical History:  Diagnosis Date  . Adnexal mass   . Diabetes mellitus    type 2  . Diabetes mellitus without complication (HCC)   . Gall bladder stones   . History of infection due to human papilloma virus (HPV) 2019   per patient , now resolved   . Hypertension   . Obesity   . PCOS (polycystic ovarian syndrome)   . Personal history of cervical dysplasia     Past Surgical History:  Procedure Laterality Date  . CHOLECYSTECTOMY N/A 12/05/2018   Procedure: LAPAROSCOPIC CHOLECYSTECTOMY WITH INTRAOPERATIVE  CHOLANGIOGRAM;  Surgeon: Ovidio Kin, MD;  Location: [facility];  Service: General;  Laterality: N/A;  . KNEE ARTHROSCOPY Right 1993  . ROBOTIC ASSISTED SALPINGO OOPHERECTOMY N/A 08/05/2019   Procedure: XI ROBOTIC ASSISTED UNILATERAL   OOPHORECTOMY,BILATERAL SALPINGECTOMY;  Surgeon: Carver Fila, MD;  Location: [facility];  Service: Gynecology;  Laterality: N/A;    Family History  Problem Relation Age of Onset  . Breast cancer Paternal Grandmother   . Lung cancer Paternal Grandfather   . Ovarian cancer Neg Hx   . Endometrial cancer Neg Hx     Social History   Socioeconomic History  . Marital status: Single    Spouse name: Not on file  . Number of children: 0  . Years of education: Not on file  . Highest education level: Not on file  Occupational History  . Not on file  Tobacco Use  . Smoking status: Former Smoker    Quit date: 07/29/2017    Years since quitting: 2.0  . Smokeless tobacco: Never Used  Substance and Sexual Activity  . Alcohol use: Yes    Comment: 1 bottle of wine each night   . Drug use: No  . Sexual activity: Yes    Birth control/protection: None  Other Topics Concern  . Not on file  Social History Narrative   ** Merged History Encounter **       Social Determinants of Health   Financial Resource Strain:   . Difficulty of Paying Living Expenses: Not on file  Food Insecurity:   .  Worried About Programme researcher, broadcasting/film/video in the Last Year: Not on file  . Ran Out of Food in the Last Year: Not on file  Transportation Needs:   . Lack of Transportation (Medical): Not on file  . Lack of Transportation (Non-Medical): Not on file  Physical Activity:   . Days of Exercise per Week: Not on file  . Minutes of Exercise per Session: Not on file  Stress:   . Feeling of Stress : Not on file  Social Connections:   . Frequency of Communication with Friends and Family: Not on file  . Frequency of Social Gatherings with Friends and Family: Not on file  . Attends Religious  Services: Not on file  . Active Member of Clubs or Organizations: Not on file  . Attends Banker Meetings: Not on file  . Marital Status: Not on file  Intimate Partner Violence:   . Fear of Current or Ex-Partner: Not on file  . Emotionally Abused: Not on file  . Physically Abused: Not on file  . Sexually Abused: Not on file    Outpatient Medications Prior to Visit  Medication Sig Dispense Refill  . BLACK ELDERBERRY PO Take 2 tablets by mouth 2 (two) times daily.    Marland Kitchen losartan-hydrochlorothiazide (HYZAAR) 50-12.5 MG tablet Take 1 tablet by mouth daily. 90 tablet 1  . metFORMIN (GLUCOPHAGE) 500 MG tablet TAKE 1 TABLET BY MOUTH WITH LUNCH AND 2 TABLETS WITH  DINNER 270 tablet 0  . ibuprofen (ADVIL) 800 MG tablet Take 1 tablet (800 mg total) by mouth every 8 (eight) hours as needed. For AFTER surgery only (Patient not taking: Reported on 08/25/2019) 30 tablet 0  . medroxyPROGESTERone (PROVERA) 10 MG tablet Take 10 mg by mouth daily.    . Multiple Vitamin (MULTIVITAMIN WITH MINERALS) TABS tablet Take 1 tablet by mouth daily.    Marland Kitchen oxyCODONE (OXY IR/ROXICODONE) 5 MG immediate release tablet Take 1 tablet (5 mg total) by mouth every 4 (four) hours as needed for severe pain. For AFTER surgery, do not take and drive (Patient not taking: Reported on 08/25/2019) 15 tablet 0  . senna-docusate (SENOKOT-S) 8.6-50 MG tablet Take 2 tablets by mouth at bedtime. For AFTER surgery, do not take if having diarrhea (Patient not taking: Reported on 08/25/2019) 30 tablet 1   No facility-administered medications prior to visit.    Allergies  Allergen Reactions  . Lisinopril Swelling    ROS Review of Systems Review of Systems  Constitutional: Negative for activity change, appetite change, chills and fever.  HENT: Negative for congestion, nosebleeds, trouble swallowing and voice change.   Respiratory: Negative for cough, shortness of breath and wheezing.   Gastrointestinal: Negative for diarrhea,  nausea and vomiting.  Genitourinary: Negative for difficulty urinating, dysuria, flank pain and hematuria.  Musculoskeletal: Negative for back pain, joint swelling and neck pain.  Neurological: Negative for dizziness, speech difficulty, light-headedness and numbness.  See HPI. All other review of systems negative.     Objective:    Physical Exam  BP 120/80   Pulse 82   Temp 98.2 F (36.8 C) (Temporal)   Resp 16   Ht 6' (1.829 m)   Wt (!) 309 lb (140.2 kg)   SpO2 98%   BMI 41.91 kg/m  Wt Readings from Last 3 Encounters:  08/25/19 (!) 309 lb (140.2 kg)  08/05/19 (!) 310 lb 8 oz (140.8 kg)  07/30/19 (!) 310 lb 8 oz (140.8 kg)   Diabetic Foot Form - Detailed  No data filed      Physical Exam  Constitutional: Oriented to person, place, and time. Appears well-developed and well-nourished.  HENT:  Head: Normocephalic and atraumatic.  Eyes: Conjunctivae and EOM are normal.  Cardiovascular: Normal rate, regular rhythm, normal heart sounds and intact distal pulses.  No murmur heard. Pulmonary/Chest: Effort normal and breath sounds normal. No stridor. No respiratory distress. Has no wheezes.  Neurological: Is alert and oriented to person, place, and time.  Skin: Skin is warm. Capillary refill takes less than 2 seconds.  Psychiatric: Has a normal mood and affect. Behavior is normal. Judgment and thought content normal.    Health Maintenance Due  Topic Date Due  . OPHTHALMOLOGY EXAM  02/02/1985    There are no preventive care reminders to display for this patient.  Lab Results  Component Value Date   TSH 2.740 11/30/2018   Lab Results  Component Value Date   WBC 6.6 07/30/2019   HGB 14.1 07/30/2019   HCT 43.2 07/30/2019   MCV 98.6 07/30/2019   PLT 228 07/30/2019   Lab Results  Component Value Date   NA 139 07/30/2019   K 4.6 07/30/2019   CO2 25 07/30/2019   GLUCOSE 123 (H) 07/30/2019   BUN 18 07/30/2019   CREATININE 1.01 (H) 07/30/2019   BILITOT 0.8  07/30/2019   ALKPHOS 49 07/30/2019   AST 55 (H) 07/30/2019   ALT 93 (H) 07/30/2019   PROT 7.5 07/30/2019   ALBUMIN 4.1 07/30/2019   CALCIUM 9.1 07/30/2019   ANIONGAP 7 07/30/2019   Lab Results  Component Value Date   CHOL 132 04/09/2019   Lab Results  Component Value Date   HDL 54 04/09/2019   Lab Results  Component Value Date   LDLCALC 67 04/09/2019   Lab Results  Component Value Date   TRIG 50 04/09/2019   Lab Results  Component Value Date   CHOLHDL 2.4 04/09/2019   Lab Results  Component Value Date   HGBA1C 6.8 (H) 04/09/2019      Assessment & Plan:   Problem List Items Addressed This Visit      Cardiovascular and Mediastinum   Essential hypertension  - Patient's blood pressure is at goal of 139/89 or less. Condition is stable. Continue current medications and treatment plan. I recommend that you exercise for 30-45 minutes 5 days a week. I also recommend a balanced diet with fruits and vegetables every day, lean meats, and little fried foods. The DASH diet (you can find this online) is a good example of this.     Other Visit Diagnoses    Type 2 diabetes mellitus with hyperglycemia, without long-term current use of insulin (HCC)    -   well controlled hemoglobin a1c is at goal Continue exercise Lipids monitored and renal function in range On metformin On arb Reviewed diabetic foot care Emphasized importance of eye and dental exam    Primary   Relevant Orders   Pneumococcal polysaccharide vaccine 23-valent greater than or equal to 2yo subcutaneous/IM (Completed)   HM Diabetes Foot Exam (Completed)   Tdap vaccine greater than or equal to 7yo IM (Completed)   Hemoglobin A1c   Lipid panel   Ambulatory referral to Ophthalmology   Need for Tdap vaccination       Relevant Orders   Tdap vaccine greater than or equal to 7yo IM (Completed)   Screening for diabetic retinopathy       Need for prophylactic vaccination against Streptococcus pneumoniae  (pneumococcus)  Relevant Orders   Pneumococcal polysaccharide vaccine 23-valent greater than or equal to 2yo subcutaneous/IM (Completed)      No orders of the defined types were placed in this encounter.   Follow-up: Return in about 6 months (around 02/25/2020).    Doristine Bosworth, MD

## 2019-08-26 ENCOUNTER — Encounter: Payer: Self-pay | Admitting: Licensed Clinical Social Worker

## 2019-08-26 ENCOUNTER — Inpatient Hospital Stay: Payer: 59 | Attending: Gynecologic Oncology | Admitting: Licensed Clinical Social Worker

## 2019-08-26 DIAGNOSIS — Z800 Family history of malignant neoplasm of digestive organs: Secondary | ICD-10-CM

## 2019-08-26 DIAGNOSIS — D3912 Neoplasm of uncertain behavior of left ovary: Secondary | ICD-10-CM

## 2019-08-26 DIAGNOSIS — Z801 Family history of malignant neoplasm of trachea, bronchus and lung: Secondary | ICD-10-CM | POA: Diagnosis not present

## 2019-08-26 DIAGNOSIS — Z90721 Acquired absence of ovaries, unilateral: Secondary | ICD-10-CM | POA: Insufficient documentation

## 2019-08-26 DIAGNOSIS — Z803 Family history of malignant neoplasm of breast: Secondary | ICD-10-CM

## 2019-08-26 DIAGNOSIS — D271 Benign neoplasm of left ovary: Secondary | ICD-10-CM | POA: Insufficient documentation

## 2019-08-26 LAB — HEMOGLOBIN A1C
Est. average glucose Bld gHb Est-mCnc: 169 mg/dL
Hgb A1c MFr Bld: 7.5 % — ABNORMAL HIGH (ref 4.8–5.6)

## 2019-08-26 LAB — LIPID PANEL
Chol/HDL Ratio: 2 ratio (ref 0.0–4.4)
Cholesterol, Total: 139 mg/dL (ref 100–199)
HDL: 70 mg/dL (ref >39)
LDL Chol Calc (NIH): 57 mg/dL (ref 0–99)
Triglycerides: 59 mg/dL (ref 0–149)
VLDL Cholesterol Cal: 12 mg/dL (ref 5–40)

## 2019-08-26 NOTE — Progress Notes (Signed)
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 Family history of breast cancer   3. Family history of pancreatic cancer   4. Family history of lung cancer   5. Family history of colon cancer    I connected with Doris Armstrong on 08/26/2019 at 11:00 AM EDT by MyChart video conference and verified that I am speaking with the correct person using two identifiers.    Patient location: home Provider location: office  HISTORY OF PRESENT ILLNESS:   Doris Armstrong, a 44 y.o. female, was seen for a [place] cancer genetics consultation at the request of Joylene John, NP due to her recent ovarian sex cord tumor and family history of cancer.  Doris Armstrong presents to clinic today to discuss the possibility of a hereditary predisposition to cancer, genetic testing, and to further clarify her future cancer risks, as well as potential cancer risks for family members.   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 This was treated with left oopherectomy.   RISK FACTORS:  Menarche was at age 44. Marland Kitchen  OCP use for approximately 1 years.  Ovaries intact: right ovary intact.  Hysterectomy: no.  Menopausal status: premenopausal.  HRT use: 0 years. Colonoscopy: no; not examined. Mammogram within the last year: yes. Number of breast biopsies: 0. Up to date with pelvic exams: yes. Any excessive radiation exposure in the past: no  Past Medical History:  Diagnosis Date  . Adnexal mass   . Diabetes mellitus    type 2  . Diabetes mellitus without complication ([place])   . Family history of breast cancer   . Family history of colon cancer   . Family history of lung cancer   . Family history of pancreatic cancer   . Gall bladder stones   . History of infection due to human papilloma virus (HPV)  2019   per patient , now resolved   . Hypertension   . Obesity   . PCOS (polycystic ovarian syndrome)   . Personal history of cervical dysplasia     Past Surgical History:  Procedure Laterality Date  . CHOLECYSTECTOMY N/A 12/05/2018   Procedure: LAPAROSCOPIC CHOLECYSTECTOMY WITH INTRAOPERATIVE CHOLANGIOGRAM;  Surgeon: Alphonsa Overall, MD;  Location: [facility];  Service: General;  Laterality: N/A;  . KNEE ARTHROSCOPY Right 1993  . ROBOTIC ASSISTED SALPINGO OOPHERECTOMY N/A 08/05/2019   Procedure: XI ROBOTIC ASSISTED UNILATERAL   OOPHORECTOMY,BILATERAL SALPINGECTOMY;  Surgeon: Lafonda Mosses, MD;  Location: [facility];  Service: Gynecology;  Laterality: N/A;    Social History   Socioeconomic History  . Marital status: Single    Spouse name: Not on file  . Number of children: 0  . Years of education: Not on file  . Highest education level: Not on file  Occupational History  . Not on file  Tobacco Use  . Smoking status: Former Smoker    Quit date: 07/29/2017    Years since quitting: 2.0  . Smokeless tobacco: Never Used  Substance and Sexual Activity  . Alcohol use: Yes    Comment: 1 bottle of wine each night   . Drug use: No  . Sexual activity: Yes    Birth control/protection: None  Other Topics Concern  . Not on file  Social History Narrative   ** Merged History Encounter **       Social Determinants of Health   Financial Resource Strain:   . Difficulty of Paying Living Expenses: Not on file  Food Insecurity:   . Worried About Charity fundraiser in the Last Year: Not on file  . Ran Out of Food in the Last Year: Not on file  Transportation Needs:   . Lack of Transportation (Medical): Not on file  . Lack of Transportation (Non-Medical): Not on file  Physical Activity:   . Days of Exercise per Week: Not on file  . Minutes of Exercise per Session: Not on file  Stress:   . Feeling of Stress : Not on file  Social Connections:   . Frequency of Communication with Friends and  Family: Not on file  . Frequency of Social Gatherings with Friends and Family: Not on file  . Attends Religious Services: Not on file  . Active Member of Clubs or Organizations: Not on file  . Attends Archivist Meetings: Not on file  . Marital Status: Not on file     FAMILY HISTORY:  We obtained a detailed, 4-generation family history.  Significant diagnoses are listed below: Family History  Problem Relation Age of Onset  . Breast cancer Paternal Grandmother 41  . Lung cancer Paternal Grandfather 41  . Pancreatic cancer Maternal Aunt 67  . Colon cancer Paternal Aunt 79  . Breast cancer Maternal Aunt 70  . Cancer Paternal Aunt        unk type  . Ovarian cancer Neg Hx   . Endometrial cancer Neg Hx    Doris Armstrong has 2 adopted sons, ages 49 and 63. She has one paternal half brother age 94 with no history of cancer.  Doris Armstrong mother died at age 4 in a car accident. Patient has 4 maternal uncles and 2 maternal aunts. One of her aunts had pancreatic cancer at 92 and the other aunt had breast cancer at 48. No cancers in her maternal grandparents, grandmother died in her 32s and grandfather died in his 17s.  Doris Armstrong father is living at 3 with no history of cancer. Patient has 2 paternal aunts. One aunt died from cancer at 2 but is unsure type. The other aunt currently has colon cancer. No known cancers in paternal cousins. Paternal grandmother had breast cancer at 30 and died at 83, grandfather had lung and mouth cancer at 92 and died at 55.   Doris Armstrong is unaware of previous family history of genetic testing for hereditary cancer risks. Patient's maternal ancestors are of African American descent, and paternal ancestors are of [place]/African American descent. There is no reported Ashkenazi Jewish ancestry. There is no known consanguinity.  GENETIC COUNSELING ASSESSMENT: Doris Armstrong is a 44 y.o. female with a personal history of an ovarian sex cord tumor and family history of  breast/pancreatic cancer which is somewhat suggestive of a hereditary cancer syndrome and predisposition to cancer. We, therefore, discussed and recommended the following at today's visit.   DISCUSSION:  We discussed that there are some hereditary conditions associated with ovarian sex cord tumors such as Peutz Jeghers syndrome and DICER1. Doris Armstrong does not have any of the other features of PJS such as mucocutaneous hyperpigmentation of mouth, lips, nose, etc, hamartomatous colon polyps or history of other PJS cancers. Given her family history, we also discussed that 5 - 10% of breast/pancreatic cancer is hereditary, with most cases associated  with BRCA1/BRCA2 mutations.  There are other genes that can be associated with hereditary breast and pancreatic cancer syndromes.   We discussed that testing is beneficial for several reasons including knowing how to follow individuals for cancer screenings and understand if other family members could be at risk for cancer and allow them to undergo genetic testing.   We reviewed the characteristics, features and inheritance patterns of hereditary cancer syndromes. We also discussed genetic testing, including the appropriate family members to test, the process of testing, insurance coverage and turn-around-time for results. We discussed the implications of a negative, positive and/or variant of uncertain significant result. We recommended Doris Armstrong pursue genetic testing for the Multi-Cancer gene panel.   The Multi-Cancer Panel offered by [facility] includes sequencing and/or deletion duplication testing of the following 85 genes: AIP, ALK, APC, ATM, AXIN2,BAP1,  BARD1, BLM, BMPR1A, BRCA1, BRCA2, BRIP1, CASR, CDC73, CDH1, CDK4, CDKN1B, CDKN1C, CDKN2A (p14ARF), CDKN2A (p16INK4a), CEBPA, CHEK2, CTNNA1, DICER1, DIS3L2, EGFR (c.2369C>T, p.Thr790Met variant only), EPCAM (Deletion/duplication testing only), FH, FLCN, GATA2, GPC3, GREM1 (Promoter region deletion/duplication  testing only), HOXB13 (c.251G>A, p.Gly84Glu), HRAS, KIT, MAX, MEN1, MET, MITF (c.952G>A, p.Glu318Lys variant only), MLH1, MSH2, MSH3, MSH6, MUTYH, NBN, NF1, NF2, NTHL1, PALB2, PDGFRA, PHOX2B, PMS2, POLD1, POLE, POT1, PRKAR1A, PTCH1, PTEN, RAD50, RAD51C, RAD51D, RB1, RECQL4, RET, RNF43, RUNX1, SDHAF2, SDHA (sequence changes only), SDHB, SDHC, SDHD, SMAD4, SMARCA4, SMARCB1, SMARCE1, STK11, SUFU, TERC, TERT, TMEM127, TP53, TSC1, TSC2, VHL, WRN and WT1.   Based on Doris Armstrong's personal history of an ovarian sex cord tumor and family history of pancreatic and breast cancer, she meets medical criteria for genetic testing. Despite that she meets criteria, she may still have an out of pocket cost.   PLAN: After considering the risks, benefits, and limitations, Doris Armstrong provided informed consent to pursue genetic testing and the blood sample was sent to [facility] for analysis of the Multi-Cancer Panel. Results should be available within approximately 2-3 weeks' time, at which point they will be disclosed by telephone to Doris Armstrong, as will any additional recommendations warranted by these results. Doris Armstrong will receive a summary of her genetic counseling visit and a copy of her results once available. This information will also be available in Epic.   Lastly, we encouraged Doris Armstrong to remain in contact with cancer genetics annually so that we can continuously update the family history and inform her of any changes in cancer genetics and testing that may be of benefit for this family.   Doris Armstrong questions were answered to her satisfaction today. Our contact information was provided should additional questions or concerns arise. Thank you for the referral and allowing [place] to share in the care of your patient.   Faith Rogue, MS, [facility] Genetic Counselor [email or web address] Phone: [phone]  The patient was seen for a total of 35 minutes in face-to-face genetic counseling.  Drs.  Magrinat, Lindi Adie and/or Burr Medico were available for discussion regarding this case.   _______________________________________________________________________ For Office Staff:  Number of people involved in session: 1 Was an Intern/ student involved with case: no

## 2019-08-27 ENCOUNTER — Inpatient Hospital Stay: Payer: 59

## 2019-08-27 ENCOUNTER — Inpatient Hospital Stay (HOSPITAL_BASED_OUTPATIENT_CLINIC_OR_DEPARTMENT_OTHER): Payer: 59 | Admitting: Gynecologic Oncology

## 2019-08-27 ENCOUNTER — Encounter: Payer: Self-pay | Admitting: Gynecologic Oncology

## 2019-08-27 ENCOUNTER — Other Ambulatory Visit: Payer: Self-pay

## 2019-08-27 VITALS — BP 131/83 | HR 88 | Temp 98.3°F | Wt 300.0 lb

## 2019-08-27 DIAGNOSIS — Z90721 Acquired absence of ovaries, unilateral: Secondary | ICD-10-CM

## 2019-08-27 DIAGNOSIS — D3912 Neoplasm of uncertain behavior of left ovary: Secondary | ICD-10-CM

## 2019-08-27 DIAGNOSIS — Z803 Family history of malignant neoplasm of breast: Secondary | ICD-10-CM

## 2019-08-27 DIAGNOSIS — Z801 Family history of malignant neoplasm of trachea, bronchus and lung: Secondary | ICD-10-CM

## 2019-08-27 DIAGNOSIS — R1900 Intra-abdominal and pelvic swelling, mass and lump, unspecified site: Secondary | ICD-10-CM

## 2019-08-27 DIAGNOSIS — Z800 Family history of malignant neoplasm of digestive organs: Secondary | ICD-10-CM

## 2019-08-27 NOTE — Patient Instructions (Signed)
You are healing well from surgery!  I will see back in 3 months.  Please call the clinic if anything changes before then at [phone].

## 2019-08-31 LAB — HM DIABETES EYE EXAM

## 2019-09-03 ENCOUNTER — Encounter: Payer: Self-pay | Admitting: Internal Medicine

## 2019-09-03 ENCOUNTER — Ambulatory Visit: Payer: 59 | Admitting: Internal Medicine

## 2019-09-03 VITALS — BP 120/84 | HR 80 | Temp 98.0°F | Ht 70.5 in | Wt 313.1 lb

## 2019-09-03 DIAGNOSIS — R12 Heartburn: Secondary | ICD-10-CM | POA: Diagnosis not present

## 2019-09-03 DIAGNOSIS — R748 Abnormal levels of other serum enzymes: Secondary | ICD-10-CM

## 2019-09-03 DIAGNOSIS — K529 Noninfective gastroenteritis and colitis, unspecified: Secondary | ICD-10-CM

## 2019-09-03 DIAGNOSIS — R1013 Epigastric pain: Secondary | ICD-10-CM | POA: Diagnosis not present

## 2019-09-03 NOTE — Progress Notes (Signed)
Doris Armstrong 44 y.o. 12-07-1975 [id number]  Assessment & Plan:   Encounter Diagnoses  Name Primary?  . Abdominal pain, epigastric Yes  . Heartburn   . Chronic diarrhea   . Abnormal transaminases     Evaluate with EGD and colonoscopy.  May very well be functional issues but the possibility of inflammatory bowel disease exists.  Abnormal transaminases are probably nonalcoholic fatty liver disease though she has admitted to drinking wine at least 2 a day and that is cutting down.  Will review further at follow-up.  The risks and benefits as well as alternatives of endoscopic procedure(s) have been discussed and reviewed. All questions answered. The patient agrees to proceed.  CC: Doristine Bosworth, MD     Subjective:   Chief Complaint: Epigastric and upper abdominal pain, diarrhea and nausea  HPI 44 year old African-American woman self-referred with several years or more of intermittent explosive diarrhea that occurs usually about 1 to 2 hours after eating breakfast in the morning and within 4 hours if she does not eat.  She thought it was related to her gallbladder, she had a cholecystectomy but the symptoms are the same.  She also has intermittent heartburn which is relieved by Zegerid OTC but also an epigastric pressure pain that comes and goes which may or may not be relieved by.  Though she is obese she says she does eat well and eats only fish at this point i.e. seafood, avoids fatty or fried foods.  "I have always been big".  She has gained some weight recently, she did have an ovarian tumor that was benign removed as it was picked up on an ultrasound and then CT scan, without symptoms but she had had some changes in her menstrual bleeding which led to the ultrasound.  No dysphagia.  Uses Zegerid about 3 times a week.  75% of the time her stools are loose or watery.  Has been reducing alcohol drinking about 2 glasses of wine a day was drinking somewhat more but does not think  that is really related to the symptoms based upon history.  No other clear triggers either.  No caffeine no drug use or tobacco.  She has been on Metformin a number of years, she says she is really only prediabetic but she still has the symptoms whether she takes the Metformin or not. Allergies  Allergen Reactions  . Lisinopril Swelling   Current Meds  Medication Sig  . BLACK ELDERBERRY PO Take 2 tablets by mouth 2 (two) times daily.  Marland Kitchen losartan-hydrochlorothiazide (HYZAAR) 50-12.5 MG tablet Take 1 tablet by mouth daily.  . metFORMIN (GLUCOPHAGE) 500 MG tablet TAKE 1 TABLET BY MOUTH WITH LUNCH AND 2 TABLETS WITH  DINNER   Past Medical History:  Diagnosis Date  . Adnexal mass   . Diabetes mellitus    type 2  . Diabetes mellitus without complication (HCC)   . Family history of breast cancer   . Family history of colon cancer   . Family history of lung cancer   . Family history of pancreatic cancer   . Gall bladder stones   . History of infection due to human papilloma virus (HPV) 2019   per patient , now resolved   . Hypertension   . Obesity   . PCOS (polycystic ovarian syndrome)   . Personal history of cervical dysplasia   . 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 CHOLECYSTECTOMY N/A 12/05/2018  Procedure: LAPAROSCOPIC CHOLECYSTECTOMY WITH INTRAOPERATIVE CHOLANGIOGRAM;  Surgeon: Ovidio Kin, MD;  Location: [facility];  Service: General;  Laterality: N/A;  . KNEE ARTHROSCOPY Right 1993  . ROBOTIC ASSISTED SALPINGO OOPHERECTOMY N/A 08/05/2019   Procedure: XI ROBOTIC ASSISTED UNILATERAL   OOPHORECTOMY,BILATERAL SALPINGECTOMY;  Surgeon: Carver Fila, MD;  Location: [facility];  Service: Gynecology;  Laterality: N/A;   Social History   Social History Narrative   She is single, she has 2 adopted sons born 2003 in 2014.  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       family history includes Breast cancer (age of onset: 20) in her paternal grandmother; Breast cancer (age of onset: 27) in her maternal aunt; Cancer in her paternal aunt; Colon cancer (age of onset: 68) in her paternal aunt; Diabetes in her maternal aunt and maternal grandmother; Hypertension in her father; Lung cancer (age of onset: 58) in her paternal grandfather; Pancreatic cancer (age of onset: 64) in her maternal aunt.   Review of Systems Seasonal allergies otherwise negative  Objective:   Physical Exam @BP  120/84 (BP Location: Left Arm, Patient Position: Sitting, Cuff Size: Large)   Pulse 80   Temp 98 F (36.7 C)   Ht 5' 10.5" (1.791 m) Comment: height measured without shoes  Wt (!) 313 lb 2 oz (142 kg)   LMP 09/01/2019   BMI 44.29 kg/m @  General:  Well-developed, obese well-nourished and in no acute distress Eyes:  anicteric. Lungs: Clear to auscultation bilaterally. Heart:  S1S2, no rubs, murmurs, gallops. Abdomen:  soft, mildly tender epigastrium, no hepatosplenomegaly, hernia, or mass and BS+.  Rectal:  deferred Lymph:  no cervical or supraclavicular adenopathy. Extremities:   no edema, cyanosis or clubbing Neuro:  A&O x 3.  Psych:  appropriate mood and  Affect.   Data Reviewed:  As above  Lab Results  Component Value Date   WBC 6.6 07/30/2019   HGB 14.1 07/30/2019   HCT 43.2 07/30/2019   MCV 98.6 07/30/2019   PLT 228 07/30/2019   Lab Results  Component Value Date   CREATININE 1.01 (H) 07/30/2019   BUN 18 07/30/2019   NA 139 07/30/2019   K 4.6 07/30/2019   CL 107 07/30/2019   CO2 25 07/30/2019   Lab Results  Component Value Date   ALT 93 (H) 07/30/2019   AST 55 (H) 07/30/2019   ALKPHOS 49 07/30/2019   BILITOT 0.8 07/30/2019

## 2019-09-03 NOTE — Patient Instructions (Signed)
If you are age 45 or older, your body mass index should be between 23-30. Your Body mass index is 44.29 kg/m. If this is out of the aforementioned range listed, please consider follow up with your Primary Care Provider.  If you are age 45 or younger, your body mass index should be between 19-25. Your Body mass index is 44.29 kg/m. If this is out of the aformentioned range listed, please consider follow up with your Primary Care Provider.   It has been recommended to you by your physician that you have a(n) endoscopy and colonoscopy completed. Per your request, we did not schedule the procedure(s) today. Please contact our office at [phone] should you decide to have the procedure completed. You will be scheduled for a pre-visit and procedure at that time.  Thank you,  Dr Silvano Rusk, MD

## 2019-09-23 ENCOUNTER — Telehealth: Payer: Self-pay | Admitting: Licensed Clinical Social Worker

## 2019-10-04 ENCOUNTER — Encounter: Payer: Self-pay | Admitting: Licensed Clinical Social Worker

## 2019-10-04 NOTE — Telephone Encounter (Signed)
Attempted x3 to reach Doris Armstrong with genetic test results, was able to leave 1 voicemail but her mailbox is now full. Will send a letter with our contact information.

## 2019-10-05 ENCOUNTER — Telehealth: Payer: Self-pay | Admitting: Family Medicine

## 2019-10-26 ENCOUNTER — Other Ambulatory Visit: Payer: Self-pay | Admitting: Family Medicine

## 2019-10-26 NOTE — Telephone Encounter (Signed)
Requested Prescriptions  Pending Prescriptions Disp Refills  . losartan-hydrochlorothiazide (HYZAAR) 50-12.5 MG tablet [Pharmacy Med Name: LOSARTAN/HCTZ 50-12.5MG  TABLET] 90 tablet 1    Sig: TAKE 1 TABLET BY MOUTH  DAILY     Cardiovascular: ARB + Diuretic Combos Failed - 10/26/2019 10:14 PM      Failed - Cr in normal range and within 180 days    Creat  Date Value Ref Range Status  02/24/2016 1.03 0.50 - 1.10 mg/dL Final   Creatinine, Ser  Date Value Ref Range Status  07/30/2019 1.01 (H) 0.44 - 1.00 mg/dL Final         Passed - K in normal range and within 180 days    Potassium  Date Value Ref Range Status  07/30/2019 4.6 3.5 - 5.1 mmol/L Final         Passed - Na in normal range and within 180 days    Sodium  Date Value Ref Range Status  07/30/2019 139 135 - 145 mmol/L Final  04/09/2019 140 134 - 144 mmol/L Final         Passed - Ca in normal range and within 180 days    Calcium  Date Value Ref Range Status  07/30/2019 9.1 8.9 - 10.3 mg/dL Final         Passed - Patient is not pregnant      Passed - Last BP in normal range    BP Readings from Last 1 Encounters:  09/03/19 120/84         Passed - Valid encounter within last 6 months    Recent Outpatient Visits          2 months ago Type 2 diabetes mellitus with hyperglycemia, without long-term current use of insulin ([place])   Primary Care at [facility], Zoe A, MD   6 months ago Need for prophylactic vaccination and inoculation against influenza   Primary Care at [facility], Arlie Solomons, MD   11 months ago Essential hypertension   Primary Care at [facility], [place] A, MD   11 months ago Type 2 diabetes mellitus with hyperglycemia, without long-term current use of insulin ([place])   Primary Care at [facility], Arlie Solomons, MD   1 year ago Essential hypertension   Primary Care at [facility], Arlie Solomons, MD

## 2019-11-19 ENCOUNTER — Other Ambulatory Visit: Payer: Self-pay | Admitting: Gynecologic Oncology

## 2019-11-19 DIAGNOSIS — D3912 Neoplasm of uncertain behavior of left ovary: Secondary | ICD-10-CM

## 2019-11-23 ENCOUNTER — Inpatient Hospital Stay: Payer: 59 | Attending: Gynecologic Oncology

## 2019-11-23 NOTE — Progress Notes (Deleted)
Gynecologic Oncology Return Clinic Visit  @DATE @  Reason for Visit: ***  Treatment History: Oncology History   No history exists.    Interval History: ***  Met with genetics, had genetic testing  Past Medical/Surgical History: Past Medical History:  Diagnosis Date   Adnexal mass    Diabetes mellitus    type 2   Diabetes mellitus without complication ([place])    Family history of breast cancer    Family history of colon cancer    Family history of lung cancer    Family history of pancreatic cancer    Gall bladder stones    History of infection due to human papilloma virus (HPV) 2019   per patient , now resolved    Hypertension    Obesity    PCOS (polycystic ovarian syndrome)    Personal history of cervical dysplasia    Sex cord tumor of ovary, 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    Past Surgical History:  Procedure Laterality Date   CHOLECYSTECTOMY N/A 12/05/2018   Procedure: LAPAROSCOPIC CHOLECYSTECTOMY WITH INTRAOPERATIVE CHOLANGIOGRAM;  Surgeon: Alphonsa Overall, MD;  [facility]: [facility];  Service: General;  Laterality: N/A;   KNEE ARTHROSCOPY Right [place] N/A 08/05/2019   Procedure: XI ROBOTIC ASSISTED UNILATERAL   OOPHORECTOMY,BILATERAL SALPINGECTOMY;  Surgeon: Lafonda Mosses, MD;  Location: [facility];  Service: Gynecology;  Laterality: N/A;    Family History  Problem Relation Age of Onset   Hypertension Father    Breast cancer Paternal Grandmother 89   Lung cancer Paternal Grandfather 89   Pancreatic cancer Maternal Aunt 67   Colon cancer Paternal Aunt 79   Diabetes Maternal Grandmother    Breast cancer Maternal Aunt 34   Cancer Paternal Aunt        unk type   Diabetes Maternal Aunt    Ovarian cancer Neg Hx    Endometrial cancer Neg Hx     Social History   Socioeconomic History   Marital status: Single    Spouse name: Not on file   Number of children: 0   Years of education: Not on file   Highest  education level: Not on file  Occupational History   Occupation: [employer]  Tobacco Use   Smoking status: Former Smoker    Types: Cigarettes    Quit date: 07/29/2017    Years since quitting: 2.3   Smokeless tobacco: Never Used  Substance and Sexual Activity   Alcohol use: Yes    Comment: 1 bottle of wine each night    Drug use: No   Sexual activity: Yes    Birth control/protection: None  Other Topics Concern   Not on file  Social History Narrative   She is single, she has 2 adopted sons born 2003 in 2014.  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       Social Determinants of Health   Financial Resource Strain:    Difficulty of Paying Living Expenses:   Food Insecurity:    Worried About Charity fundraiser in the Last Year:    Arboriculturist in the Last Year:   Transportation Needs:    Film/video editor (Medical):    Lack of Transportation (Non-Medical):   Physical Activity:    Days of Exercise per Week:    Minutes of Exercise per Session:   Stress:    Feeling of Stress :  Social Connections:    Frequency of Communication with Friends and Family:    Frequency of Social Gatherings with Friends and Family:    Attends Religious Services:    Active Member of Clubs or Organizations:    Attends Archivist Meetings:    Marital Status:     Current Medications:  Current Outpatient Medications:    BLACK ELDERBERRY PO, Take 2 tablets by mouth 2 (two) times daily., Disp: , Rfl:    losartan-hydrochlorothiazide (HYZAAR) 50-12.5 MG tablet, TAKE 1 TABLET BY MOUTH  DAILY, Disp: 90 tablet, Rfl: 1   metFORMIN (GLUCOPHAGE) 500 MG tablet, TAKE 1 TABLET BY MOUTH WITH LUNCH AND 2 TABLETS WITH  DINNER, Disp: 270 tablet, Rfl: 0  Review of Systems: Denies appetite changes, fevers, chills, fatigue, unexplained weight changes. Denies hearing loss, neck lumps or masses, mouth  sores, ringing in ears or voice changes. Denies cough or wheezing.  Denies shortness of breath. Denies chest pain or palpitations. Denies leg swelling. Denies abdominal distention, pain, blood in stools, constipation, diarrhea, nausea, vomiting, or early satiety. Denies pain with intercourse, dysuria, frequency, hematuria or incontinence. Denies hot flashes, pelvic pain, vaginal bleeding or vaginal discharge.   Denies joint pain, back pain or muscle pain/cramps. Denies itching, rash, or wounds. Denies dizziness, headaches, numbness or seizures. Denies swollen lymph nodes or glands, denies easy bruising or bleeding. Denies anxiety, depression, confusion, or decreased concentration.  Physical Exam: There were no vitals taken for this visit. General: ***Alert, oriented, no acute distress. HEENT: ***Posterior oropharynx clear, sclera anicteric. Chest: ***Clear to auscultation bilaterally.  ***Port site clean. Cardiovascular: ***Regular rate and rhythm, no murmurs. Abdomen: ***Obese, soft, nontender.  Normoactive bowel sounds.  No masses or hepatosplenomegaly appreciated.  ***Well-healed scar. Extremities: ***Grossly normal range of motion.  Warm, well perfused.  No edema bilaterally. Skin: ***No rashes or lesions noted. Lymphatics: ***No cervical, supraclavicular, or inguinal adenopathy. GU: Normal appearing external genitalia without erythema, excoriation, or lesions.  Speculum exam reveals ***.  Bimanual exam reveals ***.  ***Rectovaginal exam  confirms ___.  Laboratory & Radiologic Studies: *** CT A/P on 6/2: ***  Assessment & Plan: Doris Armstrong is a 44 y.o. woman with Stage *** who presents for ***.  ***  *** minutes of total time was spent for this patient encounter, including preparation, face-to-face counseling with the patient and coordination of care, and documentation of the encounter.  Jeral Pinch, MD  Division of Gynecologic Oncology  Department of Obstetrics and  Gynecology  [facility]

## 2019-11-24 ENCOUNTER — Ambulatory Visit (HOSPITAL_COMMUNITY): Admission: RE | Admit: 2019-11-24 | Payer: 59 | Source: Ambulatory Visit

## 2019-11-25 ENCOUNTER — Telehealth: Payer: Self-pay | Admitting: *Deleted

## 2019-11-25 NOTE — Telephone Encounter (Signed)
Attempted to reach the patient regarding her appt for tomorrow with Dr Berline Lopes. Need to cancel about until the CT scan is rescheduled and performed.

## 2019-11-26 ENCOUNTER — Inpatient Hospital Stay: Payer: 59 | Admitting: Gynecologic Oncology

## 2019-12-15 ENCOUNTER — Other Ambulatory Visit: Payer: Self-pay

## 2019-12-15 DIAGNOSIS — E1165 Type 2 diabetes mellitus with hyperglycemia: Secondary | ICD-10-CM

## 2019-12-15 MED ORDER — METFORMIN HCL 500 MG PO TABS: ORAL_TABLET | ORAL | 1 refills | Status: DC

## 2020-01-07 ENCOUNTER — Encounter: Payer: Self-pay | Admitting: Internal Medicine

## 2020-01-07 ENCOUNTER — Other Ambulatory Visit: Payer: Self-pay

## 2020-01-07 ENCOUNTER — Encounter: Payer: Self-pay | Admitting: Family Medicine

## 2020-01-07 ENCOUNTER — Ambulatory Visit: Payer: 59 | Admitting: Family Medicine

## 2020-01-07 VITALS — BP 133/90 | HR 73 | Temp 97.6°F | Ht 72.0 in | Wt 314.0 lb

## 2020-01-07 DIAGNOSIS — Z6835 Body mass index (BMI) 35.0-35.9, adult: Secondary | ICD-10-CM

## 2020-01-07 DIAGNOSIS — I10 Essential (primary) hypertension: Secondary | ICD-10-CM | POA: Diagnosis not present

## 2020-01-07 DIAGNOSIS — E6609 Other obesity due to excess calories: Secondary | ICD-10-CM | POA: Diagnosis not present

## 2020-01-07 DIAGNOSIS — E1165 Type 2 diabetes mellitus with hyperglycemia: Secondary | ICD-10-CM

## 2020-01-07 MED ORDER — METFORMIN HCL ER (OSM) 500 MG PO TB24: ORAL_TABLET | ORAL | 3 refills | Status: DC

## 2020-01-07 MED ORDER — LOSARTAN POTASSIUM-HCTZ 100-12.5 MG PO TABS: 1.0000 | ORAL_TABLET | Freq: Every day | ORAL | 3 refills | Status: DC

## 2020-01-07 NOTE — Patient Instructions (Addendum)
  Increase the Metformin to 2 pills twice daily.  Increasing blood pressure medicine to losartan HCT 100/12.5  Drink plenty of water.  Exercise more.  Decrease your carbohydrate intake.  Try to eat less food and see if you can lose some weight.  Return in about 3 or 4 months to get rechecked.   If you have lab work done today you will be contacted with your lab results within the next 2 weeks.  If you have not heard from [place] then please contact us. The fastest way to get your results is to register for My Chart.   IF you received an x-ray today, you will receive an invoice from [facility]. Please contact [facility] at [phone] with questions or concerns regarding your invoice.   IF you received labwork today, you will receive an invoice from [place] [facility]. Please contact [facility] at [phone] with questions or concerns regarding your invoice.   Our billing staff will not be able to assist you with questions regarding bills from these companies.  You will be contacted with the lab results as soon as they are available. The fastest way to get your results is to activate your My Chart account. Instructions are located on the last page of this paperwork. If you have not heard from [place] regarding the results in 2 weeks, please contact this office.

## 2020-01-07 NOTE — Progress Notes (Signed)
Patient ID: Doris Armstrong, female    DOB: [birth year]  Age: 44 y.o. MRN: [id number]  Chief Complaint  Patient presents with  . Follow-up    BP check    Subjective:   Previously a patient of Dr. Creta Levin.  Patient is here for her blood pressure medicine.  She feels well.  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  She knows her weight is an issue is.  She was drinking a little more wine earlier this year which raised her sugars but she says she has cut back on that.  No major acute complaints.    Current allergies, medications, problem list, past/family and social histories reviewed.  Objective:  BP 133/90 (BP Location: Left Arm, Patient Position: Sitting, Cuff Size: Large)   Pulse 73   Temp 97.6 F (36.4 C) (Temporal)   Ht 6' (1.829 m)   Wt (!) 314 lb (142.4 kg)   LMP 11/09/2019   SpO2 98%   BMI 42.59 kg/m   Obese pleasant lady no acute distress.  Neck supple without thyromegaly.  Chest clear.  Heart rate without murmur.  Assessment & Plan:   Assessment: 1. Type 2 diabetes mellitus with hyperglycemia, without long-term current use of insulin (HCC)   2. Essential hypertension   3. Class 2 obesity due to excess calories without serious comorbidity with body mass index (BMI) of 35.0 to 35.9 in adult       Plan: See instructions  Orders Placed This Encounter  Procedures  . Comprehensive metabolic panel  . Lipid panel  . Hemoglobin A1c    Meds ordered this encounter  Medications  . metformin (FORTAMET) 500 MG (OSM) 24 hr tablet    Sig: Take 2 tablets twice daily for diabetes.    Dispense:  360 tablet    Refill:  3  . losartan-hydrochlorothiazide (HYZAAR) 100-12.5 MG tablet    Sig: Take 1 tablet by mouth daily.    Dispense:  90 tablet    Refill:  3         Patient Instructions    Increase the Metformin to 2 pills twice daily.  Increasing blood pressure medicine to losartan HCT 100/12.5  Drink plenty of water.  Exercise more.  Decrease  your carbohydrate intake.  Try to eat less food and see if you can lose some weight.  Return in about 3 or 4 months to get rechecked.   If you have lab work done today you will be contacted with your lab results within the next 2 weeks.  If you have not heard from [place] then please contact us. The fastest way to get your results is to register for My Chart.   IF you received an x-ray today, you will receive an invoice from [facility]. Please contact [facility] at [phone] with questions or concerns regarding your invoice.   IF you received labwork today, you will receive an invoice from [place]. Please contact [facility] at [phone] with questions or concerns regarding your invoice.   Our billing staff will not be able to assist you with questions regarding bills from these companies.  You will be contacted with the lab results as soon as they are available. The fastest way to get your results is to activate your My Chart account. Instructions are located on the last page of this paperwork. If you have not heard from [place] regarding the results in 2 weeks, please contact this office.  Return in about 3 months (around 04/08/2020) for Diabetes and hypertension follow-up.   Janace Hoard, MD 01/07/2020

## 2020-01-08 LAB — LIPID PANEL
Chol/HDL Ratio: 2.4 ratio (ref 0.0–4.4)
Cholesterol, Total: 144 mg/dL (ref 100–199)
HDL: 60 mg/dL (ref >39)
LDL Chol Calc (NIH): 72 mg/dL (ref 0–99)
Triglycerides: 55 mg/dL (ref 0–149)
VLDL Cholesterol Cal: 12 mg/dL (ref 5–40)

## 2020-01-08 LAB — COMPREHENSIVE METABOLIC PANEL
ALT: 278 IU/L — ABNORMAL HIGH (ref 0–32)
AST: 208 IU/L — ABNORMAL HIGH (ref 0–40)
Albumin/Globulin Ratio: 1.5 (ref 1.2–2.2)
Albumin: 4.4 g/dL (ref 3.8–4.8)
Alkaline Phosphatase: 71 IU/L (ref 48–121)
BUN/Creatinine Ratio: 21 (ref 9–23)
BUN: 15 mg/dL (ref 6–24)
Bilirubin Total: 0.4 mg/dL (ref 0.0–1.2)
CO2: 24 mmol/L (ref 20–29)
Calcium: 9.6 mg/dL (ref 8.7–10.2)
Chloride: 102 mmol/L (ref 96–106)
Creatinine, Ser: 0.71 mg/dL (ref 0.57–1.00)
GFR calc Af Amer: 120 mL/min/{1.73_m2} (ref >59)
GFR calc non Af Amer: 104 mL/min/{1.73_m2} (ref >59)
Globulin, Total: 2.9 g/dL (ref 1.5–4.5)
Glucose: 182 mg/dL — ABNORMAL HIGH (ref 65–99)
Potassium: 4.5 mmol/L (ref 3.5–5.2)
Sodium: 139 mmol/L (ref 134–144)
Total Protein: 7.3 g/dL (ref 6.0–8.5)

## 2020-01-08 LAB — HEMOGLOBIN A1C
Est. average glucose Bld gHb Est-mCnc: 189 mg/dL
Hgb A1c MFr Bld: 8.2 % — ABNORMAL HIGH (ref 4.8–5.6)

## 2020-02-03 ENCOUNTER — Other Ambulatory Visit: Payer: Self-pay | Admitting: Family Medicine

## 2020-02-03 DIAGNOSIS — E1165 Type 2 diabetes mellitus with hyperglycemia: Secondary | ICD-10-CM

## 2020-02-03 DIAGNOSIS — R748 Abnormal levels of other serum enzymes: Secondary | ICD-10-CM

## 2020-02-03 NOTE — Progress Notes (Signed)
Call patient: Your recent lab tests showed a little elevation of your liver enzymes.  I would like you to come back by and get a repeat of the blood work done during the next week.Thank you, I apologize that I was overseas and did not get back to you sooner.Doris Reason, MD

## 2020-02-14 ENCOUNTER — Telehealth: Payer: Self-pay | Admitting: *Deleted

## 2020-02-14 NOTE — Telephone Encounter (Signed)
Missed pre-visit today.  Rescheduled pv for 02/21/2020 @ 4:30 room 52.

## 2020-02-21 ENCOUNTER — Other Ambulatory Visit: Payer: Self-pay

## 2020-02-21 ENCOUNTER — Ambulatory Visit (AMBULATORY_SURGERY_CENTER): Payer: Self-pay | Admitting: *Deleted

## 2020-02-21 VITALS — Ht 72.0 in | Wt 313.0 lb

## 2020-02-21 DIAGNOSIS — R1013 Epigastric pain: Secondary | ICD-10-CM

## 2020-02-21 DIAGNOSIS — R12 Heartburn: Secondary | ICD-10-CM

## 2020-02-21 DIAGNOSIS — K529 Noninfective gastroenteritis and colitis, unspecified: Secondary | ICD-10-CM

## 2020-02-21 NOTE — Progress Notes (Signed)

## 2020-02-22 ENCOUNTER — Encounter: Payer: Self-pay | Admitting: Internal Medicine

## 2020-03-10 ENCOUNTER — Encounter: Payer: Self-pay | Admitting: Gastroenterology

## 2020-03-10 ENCOUNTER — Other Ambulatory Visit: Payer: Self-pay

## 2020-03-10 ENCOUNTER — Ambulatory Visit (AMBULATORY_SURGERY_CENTER): Payer: 59 | Admitting: Gastroenterology

## 2020-03-10 VITALS — BP 128/74 | HR 75 | Temp 98.7°F | Resp 16 | Ht 72.0 in | Wt 313.0 lb

## 2020-03-10 DIAGNOSIS — K2970 Gastritis, unspecified, without bleeding: Secondary | ICD-10-CM | POA: Diagnosis not present

## 2020-03-10 DIAGNOSIS — D124 Benign neoplasm of descending colon: Secondary | ICD-10-CM

## 2020-03-10 DIAGNOSIS — K2980 Duodenitis without bleeding: Secondary | ICD-10-CM

## 2020-03-10 DIAGNOSIS — K5730 Diverticulosis of large intestine without perforation or abscess without bleeding: Secondary | ICD-10-CM

## 2020-03-10 DIAGNOSIS — D120 Benign neoplasm of cecum: Secondary | ICD-10-CM

## 2020-03-10 DIAGNOSIS — K319 Disease of stomach and duodenum, unspecified: Secondary | ICD-10-CM

## 2020-03-10 DIAGNOSIS — K635 Polyp of colon: Secondary | ICD-10-CM

## 2020-03-10 DIAGNOSIS — D123 Benign neoplasm of transverse colon: Secondary | ICD-10-CM

## 2020-03-10 DIAGNOSIS — K529 Noninfective gastroenteritis and colitis, unspecified: Secondary | ICD-10-CM

## 2020-03-10 DIAGNOSIS — R12 Heartburn: Secondary | ICD-10-CM

## 2020-03-10 DIAGNOSIS — R1013 Epigastric pain: Secondary | ICD-10-CM

## 2020-03-10 MED ORDER — PANTOPRAZOLE SODIUM 40 MG PO TBEC: 40.0000 mg | DELAYED_RELEASE_TABLET | Freq: Every day | ORAL | 3 refills | Status: DC

## 2020-03-10 MED ORDER — SODIUM CHLORIDE 0.9 % IV SOLN: 500.0000 mL | Freq: Once | INTRAVENOUS | Status: DC

## 2020-03-10 NOTE — Patient Instructions (Signed)
HANDOUTS PROVIDED ON:   The polyps removed/biopsies taken today have been sent for pathology.  The results can take 1-3 weeks to receive.  When your next colonoscopy should occur will be based on the pathology results.    You may resume your previous diet and medication schedule.  A prescription has been sent to your pharmacy to pick up.  Take the pantoprazole daily about 30 minutes before your 1st meal.  Thank you for allowing [place] to care for you today!!!   YOU HAD AN ENDOSCOPIC PROCEDURE TODAY AT [place]:   Refer to the procedure report that was given to you for any specific questions about what was found during the examination.  If the procedure report does not answer your questions, please call your gastroenterologist to clarify.  If you requested that your care partner not be given the details of your procedure findings, then the procedure report has been included in a sealed envelope for you to review at your convenience later.  YOU SHOULD EXPECT: Some feelings of bloating in the abdomen. Passage of more gas than usual.  Walking can help get rid of the air that was put into your GI tract during the procedure and reduce the bloating. If you had a lower endoscopy (such as a colonoscopy or flexible sigmoidoscopy) you may notice spotting of blood in your stool or on the toilet paper. If you underwent a bowel prep for your procedure, you may not have a normal bowel movement for a few days.  Please Note:  You might notice some irritation and congestion in your nose or some drainage.  This is from the oxygen used during your procedure.  There is no need for concern and it should clear up in a day or so.  SYMPTOMS TO REPORT IMMEDIATELY:   Following lower endoscopy (colonoscopy or flexible sigmoidoscopy):  Excessive amounts of blood in the stool  Significant tenderness or worsening of abdominal pains  Swelling of the abdomen that is new, acute  Fever of 100F or  higher   Following upper endoscopy (EGD)  Vomiting of blood or coffee ground material  New chest pain or pain under the shoulder blades  Painful or persistently difficult swallowing  New shortness of breath  Fever of 100F or higher  Black, tarry-looking stools  For urgent or emergent issues, a gastroenterologist can be reached at any hour by calling [phone]. Do not use MyChart messaging for urgent concerns.    DIET:  We do recommend a small meal at first, but then you may proceed to your regular diet.  Drink plenty of fluids but you should avoid alcoholic beverages for 24 hours.  ACTIVITY:  You should plan to take it easy for the rest of today and you should NOT DRIVE or use heavy machinery until tomorrow (because of the sedation medicines used during the test).    FOLLOW UP: Our staff will call the number listed on your records 48-72 hours following your procedure to check on you and address any questions or concerns that you may have regarding the information given to you following your procedure. If we do not reach you, we will leave a message.  We will attempt to reach you two times.  During this call, we will ask if you have developed any symptoms of COVID 19. If you develop any symptoms (ie: fever, flu-like symptoms, shortness of breath, cough etc.) before then, please call [phone].  If you test positive for Covid 19 in  the 2 weeks post procedure, please call and report this information to [place].    If any biopsies were taken you will be contacted by phone or by letter within the next 1-3 weeks.  Please call us at [phone] if you have not heard about the biopsies in 3 weeks.    SIGNATURES/CONFIDENTIALITY: You and/or your care partner have signed paperwork which will be entered into your electronic medical record.  These signatures attest to the fact that that the information above on your After Visit Summary has been reviewed and is understood.  Full responsibility of  the confidentiality of this discharge information lies with you and/or your care-partner.

## 2020-03-10 NOTE — Op Note (Signed)
[facility] Patient Name: Doris Armstrong Procedure Date: 03/10/2020 11:42 AM MRN: [id number] Endoscopist: Meryl Dare , MD Age: 44 Referring MD:  Date of Birth: [birth year] Gender: Female Account #: [id number] Procedure:                Colonoscopy Indications:              Chronic diarrhea Medicines:                Monitored Anesthesia Care Procedure:                Pre-Anesthesia Assessment:                           - Prior to the procedure, a History and Physical                            was performed, and patient medications and                            allergies were reviewed. The patient's tolerance of                            previous anesthesia was also reviewed. The risks                            and benefits of the procedure and the sedation                            options and risks were discussed with the patient.                            All questions were answered, and informed consent                            was obtained. Prior Anticoagulants: The patient has                            taken no previous anticoagulant or antiplatelet                            agents. ASA Grade Assessment: II - A patient with                            mild systemic disease. After reviewing the risks                            and benefits, the patient was deemed in                            satisfactory condition to undergo the procedure.                           After obtaining informed consent, the colonoscope  was passed under direct vision. Throughout the                            procedure, the patient's blood pressure, pulse, and                            oxygen saturations were monitored continuously. The                            Colonoscope was introduced through the anus and                            advanced to the the terminal ileum, with                            identification of the appendiceal orifice and IC                             valve. The terminal ileum, ileocecal valve,                            appendiceal orifice, and rectum were photographed.                            The quality of the bowel preparation was good. The                            colonoscopy was performed without difficulty. The                            patient tolerated the procedure well. Scope In: 11:44:26 AM Scope Out: 12:03:50 PM Scope Withdrawal Time: 0 hours 17 minutes 17 seconds  Total Procedure Duration: 0 hours 19 minutes 24 seconds  Findings:                 The perianal and digital rectal examinations were                            normal.                           Four sessile polyps were found in the descending                            colon (1), transverse colon (2) and cecum (1). The                            polyps were 5 to 8 mm in size. These polyps were                            removed with a cold snare. Resection and retrieval                            were complete.  The terminal ileum appeared normal.                           Multiple small-mouthed diverticula were found in                            the left colon. There was no evidence of                            diverticular bleeding.                           The exam was otherwise without abnormality on                            direct and retroflexion views. Random biopsies                            obtained throughout. Complications:            No immediate complications. Estimated blood loss:                            None. Estimated Blood Loss:     Estimated blood loss: none. Impression:               - Four 5 to 8 mm polyps in the descending colon, in                            the transverse colon and in the cecum, removed with                            a cold snare. Resected and retrieved.                           - Mild left colon diverticulosis.                           - The examined  portion of the ileum was normal.                           - The examination was otherwise normal on direct                            and retroflexion views. Random biopsies obtained. Recommendation:           - Repeat colonoscopy after studies are complete for                            surveillance based on pathology results with Dr.                            Leone Payor.                           - Patient has a contact number available for  emergencies. The signs and symptoms of potential                            delayed complications were discussed with the                            patient. Return to normal activities tomorrow.                            Written discharge instructions were provided to the                            patient.                           - Resume previous diet.                           - Continue present medications.                           - Await pathology results.                           - Return to GI office in 6 weeks with Dr. Leone Payor                            or an APP. Meryl Dare, MD 03/10/2020 12:16:40 PM This report has been signed electronically.

## 2020-03-10 NOTE — Progress Notes (Signed)
Called to room to assist during endoscopic procedure.  Patient ID and intended procedure confirmed with present staff. Received instructions for my participation in the procedure from the performing physician.  

## 2020-03-10 NOTE — Op Note (Signed)
[facility] Patient Name: Doris Armstrong Procedure Date: 03/10/2020 11:41 AM MRN: [id number] Endoscopist: Meryl Dare , MD Age: 44 Referring MD:  Date of Birth: 01/03/1976 Gender: Female Account #: [id number] Procedure:                Upper GI endoscopy Indications:              Epigastric abdominal pain, Heartburn Medicines:                Monitored Anesthesia Care Procedure:                Pre-Anesthesia Assessment:                           - Prior to the procedure, a History and Physical                            was performed, and patient medications and                            allergies were reviewed. The patient's tolerance of                            previous anesthesia was also reviewed. The risks                            and benefits of the procedure and the sedation                            options and risks were discussed with the patient.                            All questions were answered, and informed consent                            was obtained. Prior Anticoagulants: The patient has                            taken no previous anticoagulant or antiplatelet                            agents. ASA Grade Assessment: III - A patient with                            severe systemic disease. After reviewing the risks                            and benefits, the patient was deemed in                            satisfactory condition to undergo the procedure.                           After obtaining informed consent, the endoscope was  passed under direct vision. Throughout the                            procedure, the patient's blood pressure, pulse, and                            oxygen saturations were monitored continuously. The                            Endoscope was introduced through the mouth, and                            advanced to the second part of duodenum. The upper                            GI endoscopy  was accomplished without difficulty.                            The patient tolerated the procedure well. Scope In: Scope Out: Findings:                 The examined esophagus was normal.                           Localized mild inflammation characterized by                            erythema and granularity was found in the gastric                            body. Biopsies were taken with a cold forceps for                            histology.                           The exam of the stomach was otherwise normal.                           A single 5 mm mucosal nodule with a localized                            distribution was found in the duodenal bulb.                            Biopsies were taken with a cold forceps for                            histology.                           The exam of the duodenum was otherwise normal. Complications:            No immediate complications. Estimated Blood Loss:     Estimated blood loss was minimal. Impression:               -  Normal esophagus.                           - Gastritis. Biopsied.                           - Mucosal nodule found in the duodenum. Biopsied. Recommendation:           - Patient has a contact number available for                            emergencies. The signs and symptoms of potential                            delayed complications were discussed with the                            patient. Return to normal activities tomorrow.                            Written discharge instructions were provided to the                            patient.                           - Resume previous diet.                           - Antireflux measures.                           - Continue present medications.                           - Pantoprazole 40 mg po qd, 1 year of refills.                           - Await pathology results.                           - Return to GI office in 6 weeks with Dr. Leone Payor                             or an APP. Meryl Dare, MD 03/10/2020 12:20:48 PM This report has been signed electronically.

## 2020-03-10 NOTE — Progress Notes (Signed)
Report to PACU, RN, vss, BBS= Clear.  

## 2020-03-10 NOTE — Progress Notes (Signed)
Pt. Reports no change in her medical or surgical history since her pre-visit 02/21/2020.

## 2020-03-14 ENCOUNTER — Telehealth: Payer: Self-pay

## 2020-03-14 NOTE — Telephone Encounter (Signed)
1st follow up call made.  NALM 

## 2020-03-20 ENCOUNTER — Encounter: Payer: Self-pay | Admitting: Gastroenterology

## 2020-07-04 ENCOUNTER — Telehealth: Payer: Self-pay | Admitting: *Deleted

## 2020-07-04 NOTE — Telephone Encounter (Signed)
Attempted to call the patient to schedule a follow up appt. Voice mail was full

## 2020-07-05 ENCOUNTER — Telehealth: Payer: Self-pay | Admitting: *Deleted

## 2020-07-05 NOTE — Telephone Encounter (Signed)
Attempted to reach the patient to schedule a follow up appt; unable to leave a message patient's voicemail is full.

## 2020-07-07 ENCOUNTER — Encounter: Payer: Self-pay | Admitting: Family Medicine

## 2020-07-07 ENCOUNTER — Encounter: Payer: Self-pay | Admitting: *Deleted

## 2020-07-07 ENCOUNTER — Other Ambulatory Visit: Payer: Self-pay

## 2020-07-07 ENCOUNTER — Ambulatory Visit: Payer: 59 | Admitting: Family Medicine

## 2020-07-07 ENCOUNTER — Ambulatory Visit: Payer: BLUE CROSS/BLUE SHIELD | Admitting: Family Medicine

## 2020-07-07 VITALS — BP 129/80 | HR 84 | Temp 98.0°F | Ht 72.0 in | Wt 318.6 lb

## 2020-07-07 DIAGNOSIS — S39012A Strain of muscle, fascia and tendon of lower back, initial encounter: Secondary | ICD-10-CM

## 2020-07-07 MED ORDER — METHOCARBAMOL 500 MG PO TABS: ORAL_TABLET | ORAL | 0 refills | Status: DC

## 2020-07-07 MED ORDER — DICLOFENAC SODIUM 75 MG PO TBEC: 75.0000 mg | DELAYED_RELEASE_TABLET | Freq: Two times a day (BID) | ORAL | 0 refills | Status: DC

## 2020-07-07 NOTE — Telephone Encounter (Addendum)
Called and left the patient a message to call the office back. Patient need to be scheduled for a follow up appt. Mailed letter

## 2020-07-07 NOTE — Patient Instructions (Addendum)
   Take diclofenac 75 mg 1 twice daily at breakfast and dinner.  (While you are on diclofenac you should not take ibuprofen or Aleve)  In addition to that you can take acetaminophen (Tylenol) 500 mg 2 pills 3 times daily for pain.  If your muscles seem tight you can go ahead and take methocarbamol (Robaxin) 500 mg 1 pill 3 times daily for muscle relaxant.  Move around some to stretch it, but avoid straining type activities that might make it worse.  If you are not improving considerably over the next week you may need to be followed up with someone.  If you decide at that point that you are following it on Worker's Compensation, you will need to be referred to the [place] clinic since we no longer do [employer] here.    If you have lab work done today you will be contacted with your lab results within the next 2 weeks.  If you have not heard from [place] then please contact us. The fastest way to get your results is to register for My Chart.   IF you received an x-ray today, you will receive an invoice from [facility]. Please contact [facility] at [phone] with questions or concerns regarding your invoice.   IF you received labwork today, you will receive an invoice from [place]. Please contact [facility] at [phone] with questions or concerns regarding your invoice.   Our billing staff will not be able to assist you with questions regarding bills from these companies.  You will be contacted with the lab results as soon as they are available. The fastest way to get your results is to activate your My Chart account. Instructions are located on the last page of this paperwork. If you have not heard from [place] regarding the results in 2 weeks, please contact this office.

## 2020-07-07 NOTE — Progress Notes (Signed)
Patient ID: Doris Armstrong, female    DOB: [birth year]  Age: 45 y.o. MRN: [id number]  Chief Complaint  Patient presents with  . Hip Pain    Hip post tripped with a package. Patient did not fall    Subjective:   Patient works for the [employer].  She was walking outdoors, on the job, and Production manager.  She somehow twisted and staggered.  She staggered to catch her balance and never fell.  However by the next day she was having a lot of pain in the right hip which is continued to persist.  She informed her supervisor who told her to go get checked but she was not past through [employer] is my understanding.  Current allergies, medications, problem list, past/family and social histories reviewed.  Objective:  BP 129/80 (BP Location: Right Arm, Patient Position: Sitting, Cuff Size: Large)   Pulse 84   Temp 98 F (36.7 C) (Temporal)   Ht 6' (1.829 m)   Wt (!) 318 lb 9.6 oz (144.5 kg)   LMP 06/22/2020   SpO2 98%   BMI 43.21 kg/m   Mildly tender over the right SI joint, but really not much tenderness anywhere.  Straight leg raising test negative.  Rotation of the hip laterally causes some pain in the back.  Straight leg raising of the left leg causes a little bit of right SI area pain.  Gait is fairly satisfactory, may be a minimal limp.  Assessment & Plan:   Assessment: 1. Sacroiliac strain, initial encounter       Plan: The nature of the injury and exam do not indicate a need for x-rays at this time.  See instructions.  No orders of the defined types were placed in this encounter.   Meds ordered this encounter  Medications  . diclofenac (VOLTAREN) 75 MG EC tablet    Sig: Take 1 tablet (75 mg total) by mouth 2 (two) times daily.    Dispense:  30 tablet    Refill:  0  . methocarbamol (ROBAXIN) 500 MG tablet    Sig: Take 1 pill 4 times daily at breakfast, lunch, supper, and bedtime for muscle relaxant as needed.    Dispense:  40 tablet    Refill:  0          Patient Instructions     Take diclofenac 75 mg 1 twice daily at breakfast and dinner.  (While you are on diclofenac you should not take ibuprofen or Aleve)  In addition to that you can take acetaminophen (Tylenol) 500 mg 2 pills 3 times daily for pain.  If your muscles seem tight you can go ahead and take methocarbamol (Robaxin) 500 mg 1 pill 3 times daily for muscle relaxant.  Move around some to stretch it, but avoid straining type activities that might make it worse.  If you are not improving considerably over the next week you may need to be followed up with someone.  If you decide at that point that you are following it on Worker's Compensation, you will need to be referred to the [facility] clinic since we no longer do [employer] here.    If you have lab work done today you will be contacted with your lab results within the next 2 weeks.  If you have not heard from [place] then please contact us. The fastest way to get your results is to register for My Chart.   IF you received an x-ray today,  you will receive an invoice from [facility]. Please contact [facility] at [phone] with questions or concerns regarding your invoice.   IF you received labwork today, you will receive an invoice from [place]. Please contact [facility] at [phone] with questions or concerns regarding your invoice.   Our billing staff will not be able to assist you with questions regarding bills from these companies.  You will be contacted with the lab results as soon as they are available. The fastest way to get your results is to activate your My Chart account. Instructions are located on the last page of this paperwork. If you have not heard from [place] regarding the results in 2 weeks, please contact this office.         Return if symptoms worsen or fail to improve.   Janace Hoard, MD 07/07/2020

## 2020-07-14 ENCOUNTER — Telehealth: Payer: Self-pay | Admitting: Family Medicine

## 2020-07-14 ENCOUNTER — Other Ambulatory Visit: Payer: Self-pay

## 2020-07-14 DIAGNOSIS — I10 Essential (primary) hypertension: Secondary | ICD-10-CM

## 2020-07-14 DIAGNOSIS — E1165 Type 2 diabetes mellitus with hyperglycemia: Secondary | ICD-10-CM

## 2020-07-14 MED ORDER — LOSARTAN POTASSIUM-HCTZ 100-12.5 MG PO TABS: 1.0000 | ORAL_TABLET | Freq: Every day | ORAL | 3 refills | Status: AC

## 2020-07-14 MED ORDER — METFORMIN HCL ER (OSM) 500 MG PO TB24: ORAL_TABLET | ORAL | 3 refills | Status: DC

## 2020-07-14 NOTE — Telephone Encounter (Signed)
Done

## 2020-07-14 NOTE — Telephone Encounter (Signed)
What is the name of the medication? losartan-hydrochlorothiazide (HYZAAR) 100-12.5 MG tablet [[id number]] and metformin (FORTAMET) 500 MG (OSM) 24 hr tablet [[id number]    Have you contacted your pharmacy to request a refill? Yes, she needs these scripts filled.   Which pharmacy would you like this sent to? Pharmacy  [facility], [address], [address], [address], [place] [address]  Phone:  [phone] Fax:  [phone]       Patient notified that their request is being sent to the clinical staff for review and that they should receive a call once it is complete. If they do not receive a call within 72 hours they can check with their pharmacy or our office.

## 2020-07-19 ENCOUNTER — Telehealth: Payer: Self-pay | Admitting: *Deleted

## 2020-07-19 NOTE — Telephone Encounter (Signed)
Patient called and schedule a follow up appt for February with Dr Berline Lopes

## 2020-07-28 ENCOUNTER — Other Ambulatory Visit: Payer: Self-pay

## 2020-07-28 ENCOUNTER — Ambulatory Visit: Payer: BLUE CROSS/BLUE SHIELD | Admitting: Registered Nurse

## 2020-07-28 DIAGNOSIS — E1165 Type 2 diabetes mellitus with hyperglycemia: Secondary | ICD-10-CM

## 2020-07-28 DIAGNOSIS — R748 Abnormal levels of other serum enzymes: Secondary | ICD-10-CM

## 2020-07-29 ENCOUNTER — Other Ambulatory Visit: Payer: Self-pay | Admitting: Family Medicine

## 2020-07-29 DIAGNOSIS — E1165 Type 2 diabetes mellitus with hyperglycemia: Secondary | ICD-10-CM

## 2020-07-29 LAB — COMPREHENSIVE METABOLIC PANEL
ALT: 171 IU/L — ABNORMAL HIGH (ref 0–32)
AST: 93 IU/L — ABNORMAL HIGH (ref 0–40)
Albumin/Globulin Ratio: 1.5 (ref 1.2–2.2)
Albumin: 4.3 g/dL (ref 3.8–4.8)
Alkaline Phosphatase: 88 IU/L (ref 44–121)
BUN/Creatinine Ratio: 16 (ref 9–23)
BUN: 15 mg/dL (ref 6–24)
Bilirubin Total: 0.2 mg/dL (ref 0.0–1.2)
CO2: 26 mmol/L (ref 20–29)
Calcium: 9.6 mg/dL (ref 8.7–10.2)
Chloride: 96 mmol/L (ref 96–106)
Creatinine, Ser: 0.94 mg/dL (ref 0.57–1.00)
GFR calc Af Amer: 85 mL/min/{1.73_m2} (ref >59)
GFR calc non Af Amer: 74 mL/min/{1.73_m2} (ref >59)
Globulin, Total: 2.9 g/dL (ref 1.5–4.5)
Glucose: 429 mg/dL — ABNORMAL HIGH (ref 65–99)
Potassium: 4.6 mmol/L (ref 3.5–5.2)
Sodium: 134 mmol/L (ref 134–144)
Total Protein: 7.2 g/dL (ref 6.0–8.5)

## 2020-07-29 LAB — HEMOGLOBIN A1C
Est. average glucose Bld gHb Est-mCnc: 235 mg/dL
Hgb A1c MFr Bld: 9.8 % — ABNORMAL HIGH (ref 4.8–5.6)

## 2020-07-29 MED ORDER — SITAGLIPTIN PHOSPHATE 100 MG PO TABS: 100.0000 mg | ORAL_TABLET | Freq: Every day | ORAL | 3 refills | Status: DC

## 2020-07-29 NOTE — Progress Notes (Signed)
Call patient:  The liver enzymes are still high but improved from before.  They probably are from a fatty liver which diabetes can cause.  Your labs yesterday show your diabetes has gotten much worse since last summer.  Did you increase the metformin to 500 mg 2 pills twice daily as recommended in July?  If not, you should do so.    If you are taking the metformin as above, then we need to add another medication.    I am prescribing januvia 100 mg to take one daily.  You need to make an appointment to follow up with a primary care doctor soon.  Primary Care at [facility] is closing late March (per [facility]) and I recommend you find a regular family physician or internist to care for you elsewhere.  You should be seen in the next month or so, sooner if problems.  Ruben Reason MD

## 2020-07-29 NOTE — Progress Notes (Signed)
RX sent to pharmacy for Annetta Maw MD

## 2020-07-31 ENCOUNTER — Other Ambulatory Visit: Payer: Self-pay

## 2020-07-31 ENCOUNTER — Other Ambulatory Visit: Payer: Self-pay | Admitting: Family Medicine

## 2020-07-31 DIAGNOSIS — E1165 Type 2 diabetes mellitus with hyperglycemia: Secondary | ICD-10-CM

## 2020-07-31 MED ORDER — METFORMIN HCL ER (OSM) 500 MG PO TB24: ORAL_TABLET | ORAL | 3 refills | Status: DC

## 2020-07-31 NOTE — Telephone Encounter (Signed)
Pharmacy requesting an alternative.  This is not covered by their insurance.

## 2020-08-04 ENCOUNTER — Encounter: Payer: Self-pay | Admitting: Gynecologic Oncology

## 2020-08-04 ENCOUNTER — Other Ambulatory Visit: Payer: Self-pay

## 2020-08-04 ENCOUNTER — Inpatient Hospital Stay: Payer: BLUE CROSS/BLUE SHIELD | Attending: Gynecologic Oncology | Admitting: Gynecologic Oncology

## 2020-08-04 VITALS — BP 105/73 | HR 88 | Temp 97.7°F | Resp 20 | Ht 72.0 in | Wt 306.0 lb

## 2020-08-04 DIAGNOSIS — S39012A Strain of muscle, fascia and tendon of lower back, initial encounter: Secondary | ICD-10-CM

## 2020-08-04 DIAGNOSIS — Z90721 Acquired absence of ovaries, unilateral: Secondary | ICD-10-CM | POA: Diagnosis not present

## 2020-08-04 DIAGNOSIS — D3912 Neoplasm of uncertain behavior of left ovary: Secondary | ICD-10-CM | POA: Diagnosis not present

## 2020-08-04 NOTE — Progress Notes (Signed)
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   Interval History: The patient is overall doing well.  She describes having symptoms in the morning while she is at work that include hot flashes, stomachache, and dizziness.  These have been present for months and she denies any change in frequency or severity.  She endorses a good appetite without nausea or emesis.  She reports regular bowel and bladder function.  She denies any abdominal or pelvic pain.  She continues to have irregular menses that occur about every 3 months if she forgets to take progesterone.  She tries to remember to take this monthly to induce a menses.  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   She had an endoscopy and colonoscopy in September 2021.  Past Medical/Surgical History: Past Medical History:  Diagnosis Date  . Adnexal mass   . Allergy   . Diabetes mellitus    type 2  . Diabetes mellitus without complication ([place])   . Family history of breast cancer   . Family history of colon cancer   . Family history of lung cancer   . Family history of pancreatic cancer   . Gall bladder stones   . GERD (gastroesophageal reflux disease)   . History of infection due to human papilloma virus (HPV) 2019   per patient , now resolved   . Hypertension   . Obesity   . PCOS (polycystic ovarian syndrome)   . Personal history of cervical dysplasia   . 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 CHOLECYSTECTOMY N/A 12/05/2018   Procedure: LAPAROSCOPIC CHOLECYSTECTOMY WITH INTRAOPERATIVE CHOLANGIOGRAM;  Surgeon:  Alphonsa Overall, MD;  Location: [facility];  Service: General;  Laterality: N/A;  . KNEE ARTHROSCOPY Right 1993  . ROBOTIC ASSISTED SALPINGO OOPHERECTOMY N/A 08/05/2019   Procedure: XI ROBOTIC ASSISTED UNILATERAL   OOPHORECTOMY,BILATERAL SALPINGECTOMY;  Surgeon: Lafonda Mosses, MD;  Location: [facility];  Service: Gynecology;  Laterality: N/A;    Family History  Problem Relation Age of Onset  . Hypertension Father   . Breast cancer Paternal Grandmother 85  . Lung cancer Paternal Grandfather 59  . Esophageal cancer Paternal Grandfather   . Pancreatic cancer Maternal Aunt 67  . Colon cancer Paternal Aunt 79  . Diabetes Maternal Grandmother   . Breast cancer Maternal Aunt 70  . Cancer Paternal Aunt        unk type  . Diabetes Maternal Aunt   . Ovarian cancer Neg Hx   . Endometrial cancer Neg Hx   . Colon polyps Neg Hx   . Rectal cancer Neg Hx   . Stomach cancer Neg Hx     Social History   Socioeconomic History  . Marital status: Single    Spouse name: Not on file  . Number of children: 0  . Years of education: Not on file  . Highest education level: Not on file  Occupational History  . Occupation: psot office  Tobacco Use  . Smoking status: Former Smoker    Types: Cigarettes    Quit date: 07/29/2017    Years  since quitting: 3.0  . Smokeless tobacco: Never Used  Vaping Use  . Vaping Use: Never used  Substance and Sexual Activity  . Alcohol use: Yes    Comment: 1 bottle of wine each night   . Drug use: No  . Sexual activity: Yes    Birth control/protection: None  Other Topics Concern  . Not on file  Social History Narrative   She is single, she has 2 adopted sons born 2003 in 2014.  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       Social Determinants of Health   Financial Resource Strain: Not on file  Food Insecurity: Not on file  Transportation Needs: Not on file  Physical Activity: Not on file   Stress: Not on file  Social Connections: Not on file    Current Medications:  Current Outpatient Medications:  .  losartan-hydrochlorothiazide (HYZAAR) 100-12.5 MG tablet, Take 1 tablet by mouth daily., Disp: 90 tablet, Rfl: 3 .  metformin (FORTAMET) 500 MG (OSM) 24 hr tablet, Take 2 tablets twice daily for diabetes., Disp: 360 tablet, Rfl: 3 .  BLACK ELDERBERRY PO, Take 2 tablets by mouth 2 (two) times daily. (Patient not taking: Reported on 08/04/2020), Disp: , Rfl:  .  diclofenac (VOLTAREN) 75 MG EC tablet, Take 1 tablet (75 mg total) by mouth 2 (two) times daily. (Patient not taking: Reported on 08/04/2020), Disp: 30 tablet, Rfl: 0 .  methocarbamol (ROBAXIN) 500 MG tablet, Take 1 pill 4 times daily at breakfast, lunch, supper, and bedtime for muscle relaxant as needed. (Patient not taking: Reported on 08/04/2020), Disp: 40 tablet, Rfl: 0 .  sitaGLIPtin (JANUVIA) 100 MG tablet, Take 1 tablet (100 mg total) by mouth daily. (Patient not taking: Reported on 08/04/2020), Disp: 30 tablet, Rfl: 3  Review of Systems: Pertinent positives as per HPI Denies appetite changes, fevers, chills, fatigue, unexplained weight changes. Denies hearing loss, neck lumps or masses, mouth sores, ringing in ears or voice changes. Denies cough or wheezing.  Denies shortness of breath. Denies chest pain or palpitations. Denies leg swelling. Denies abdominal distention, pain, blood in stools, constipation, diarrhea, nausea, vomiting, or early satiety. Denies pain with intercourse, dysuria, frequency, hematuria or incontinence. Denies pelvic pain, vaginal bleeding or vaginal discharge.   Denies joint pain, back pain or muscle pain/cramps. Denies itching, rash, or wounds. Denies headaches, numbness or seizures. Denies swollen lymph nodes or glands, denies easy bruising or bleeding. Denies anxiety, depression, confusion, or decreased concentration.  Physical Exam: BP 105/73 (BP Location: Left Arm, Patient Position:  Sitting)   Pulse 88   Temp 97.7 F (36.5 C) (Tympanic)   Resp 20   Ht 6' (1.829 m)   Wt (!) 306 lb (138.8 kg)   SpO2 100% Comment: RA  BMI 41.50 kg/m  General: Alert, oriented, no acute distress. HEENT: Atraumatic, normocephalic, sclera anicteric. Chest: Unlabored breathing on room air. Abdomen: Obese, soft, nontender.  Normoactive bowel sounds.  No masses or hepatosplenomegaly appreciated.  Well-healed incisions. Extremities: Grossly normal range of motion.  Warm, well perfused.  No edema bilaterally. Skin: No rashes or lesions noted. Lymphatics: No cervical, supraclavicular, or inguinal adenopathy. GU: Normal appearing external genitalia without erythema, excoriation, or lesions.  Speculum exam reveals normal-appearing cervix, no lesions.  Bimanual exam reveals mobile uterus, no adnexal masses, no nodularity.   Laboratory & Radiologic Studies: Last hemoglobin A1c earlier this month was 9.8%  Assessment &  Plan: Doris Armstrong is a 45 y.o. 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  The patient is overall doing well.  We discussed that her symptoms may be related to perimenopause although this will be somewhat difficult to discern given her oligomenorrhea at baseline.  I have asked her to contact me if her hot flashes become any worse or more frequent.  Given borderline enlarged para-aortic lymph nodes around the time of her surgery on imaging, I recommend again that we repeat imaging.  This was planned for the summer of last year but not performed.  Patient was amenable to having a schedule a repeat CT scan.  I reviewed that based on the review by pathology, they felt that this was a bland tumor with low malignant potential.  Even so, I continue to recommend that we perform surveillance as if it were a cancer diagnosis.  I will plan to see her back in 6 months.  28 minutes of total time was spent for this patient encounter,  including preparation, face-to-face counseling with the patient and coordination of care, and documentation of the encounter.  Jeral Pinch, MD  Division of Gynecologic Oncology  Department of Obstetrics and Gynecology  [facility]

## 2020-08-04 NOTE — Patient Instructions (Signed)
Great to see you! I don't see or feel anything concerning on your CT.  I will call you with the results of your CT scan once I get them.  Again, this is to evaluate the mildly enlarged lymph nodes that we saw around the time of surgery.  As long as they are stable in size or if they have decreased, I think this is overall very reassuring.  I would like to see you for follow-up in 6 months.  My schedule is not out past June, so I did asked that you call back over the summer to get a follow-up scheduled for August or September.  If you develop any new symptoms, such as pelvic pain or bloating before then, please call to see me sooner.

## 2020-08-18 ENCOUNTER — Encounter (HOSPITAL_COMMUNITY): Payer: Self-pay

## 2020-08-18 ENCOUNTER — Ambulatory Visit (HOSPITAL_COMMUNITY)
Admission: RE | Admit: 2020-08-18 | Discharge: 2020-08-18 | Disposition: A | Discharge status: Home / Self Care | Payer: BLUE CROSS/BLUE SHIELD | Source: Ambulatory Visit | Attending: Gynecologic Oncology | Admitting: Gynecologic Oncology

## 2020-08-18 ENCOUNTER — Other Ambulatory Visit: Payer: Self-pay

## 2020-08-18 DIAGNOSIS — D3912 Neoplasm of uncertain behavior of left ovary: Secondary | ICD-10-CM | POA: Diagnosis not present

## 2020-08-18 MED ORDER — IOHEXOL 300 MG/ML  SOLN
100.0000 mL | Freq: Once | INTRAMUSCULAR | Status: AC | PRN
  Administered 2020-08-18: 100 mL via INTRAVENOUS

## 2020-08-21 ENCOUNTER — Telehealth: Payer: Self-pay | Admitting: Oncology

## 2020-08-21 ENCOUNTER — Other Ambulatory Visit: Payer: Self-pay | Admitting: Gynecologic Oncology

## 2020-08-21 DIAGNOSIS — R1900 Intra-abdominal and pelvic swelling, mass and lump, unspecified site: Secondary | ICD-10-CM

## 2020-08-21 NOTE — Telephone Encounter (Signed)
Called Doris Armstrong and advised her of [place] appointment on 08/25/20 (arrive at 2:45 to [facility] admitting, needs a full bladder) to evaluate the right adnexal cyst seen on CT.  She verbalized understanding and agreement of appointment time and instructions.

## 2020-08-21 NOTE — Progress Notes (Signed)
Sent message with CT report to patient regarding recommendation that we get an ultrasound to better characterize right pelvic mass. Jeral Pinch MD Gynecologic Oncology

## 2020-08-25 ENCOUNTER — Ambulatory Visit (HOSPITAL_COMMUNITY): Payer: BLUE CROSS/BLUE SHIELD

## 2020-09-01 ENCOUNTER — Ambulatory Visit (HOSPITAL_COMMUNITY)
Admission: RE | Admit: 2020-09-01 | Discharge: 2020-09-01 | Disposition: A | Discharge status: Home / Self Care | Payer: BLUE CROSS/BLUE SHIELD | Source: Ambulatory Visit | Attending: Gynecologic Oncology | Admitting: Gynecologic Oncology

## 2020-09-01 ENCOUNTER — Other Ambulatory Visit: Payer: Self-pay

## 2020-09-01 DIAGNOSIS — R1900 Intra-abdominal and pelvic swelling, mass and lump, unspecified site: Secondary | ICD-10-CM

## 2020-09-04 ENCOUNTER — Telehealth: Payer: Self-pay | Admitting: Oncology

## 2020-09-04 NOTE — Telephone Encounter (Signed)
Called Doris Armstrong and advised her of [place] results and that the plan will be for visit and repeat ultrasound in 6 months. Discussed that the cyst is very benign in appearance per Dr. Berline Lopes.  She verbalized understanding and agreement.

## 2020-09-08 ENCOUNTER — Ambulatory Visit: Payer: Self-pay | Admitting: Internal Medicine

## 2020-09-15 ENCOUNTER — Encounter: Payer: Self-pay | Admitting: Orthopaedic Surgery

## 2020-09-15 ENCOUNTER — Ambulatory Visit: Payer: Self-pay

## 2020-09-15 ENCOUNTER — Ambulatory Visit: Payer: BLUE CROSS/BLUE SHIELD | Admitting: Orthopaedic Surgery

## 2020-09-15 VITALS — Ht 72.0 in | Wt 280.0 lb

## 2020-09-15 DIAGNOSIS — M25561 Pain in right knee: Secondary | ICD-10-CM | POA: Diagnosis not present

## 2020-09-15 DIAGNOSIS — G8929 Other chronic pain: Secondary | ICD-10-CM | POA: Diagnosis not present

## 2020-09-15 MED ORDER — DICLOFENAC SODIUM 75 MG PO TBEC: 75.0000 mg | DELAYED_RELEASE_TABLET | Freq: Two times a day (BID) | ORAL | 2 refills | Status: DC

## 2020-09-15 NOTE — Progress Notes (Signed)
Office Visit Note   Patient: Doris Armstrong           Date of Birth: 06/17/1976           MRN: [id number] Visit Date: 09/15/2020              Requested by: Peyton Najjar, MD [address] [place],  [place] [address] PCP: Peyton Najjar, MD   Assessment & Plan: Visit Diagnoses:  1. Chronic pain of right knee     Plan: Impression is right knee OA exacerbation versus degenerative medial meniscal tear.  Based on options of schedule NSAIDs versus cortisone injection she opted for scheduled NSAIDs for now.  Her diabetes has not been under great control with recent A1c of 9.8.  She will continue to wear the knee brace and take it easy from the exercise program.  She will follow up with [place] if she does not notice any improvement in about a month or so.  Follow-Up Instructions: Return if symptoms worsen or fail to improve.   Orders:  Orders Placed This Encounter  Procedures  . XR KNEE 3 VIEW RIGHT   Meds ordered this encounter  Medications  . diclofenac (VOLTAREN) 75 MG EC tablet    Sig: Take 1 tablet (75 mg total) by mouth 2 (two) times daily.    Dispense:  30 tablet    Refill:  2      Procedures: No procedures performed   Clinical Data: No additional findings.   Subjective: Chief Complaint  Patient presents with  . Right Knee - Pain    Doris Armstrong is a very pleasant 45 year old female comes in for evaluation of chronic right knee pain for a month that started after a new exercise routine.  Denies any injuries.  She has had a prior knee scope back in high school and it sounds like it was just a cleanout.  She endorses medial knee pain with swelling and popping giving way and questionable catching.  Ibuprofen gives temporary relief.  She has been using ice at the end of the day.  She works for the post [employer].   Review of Systems  Constitutional: Negative.   HENT: Negative.   Eyes: Negative.   Respiratory: Negative.   Cardiovascular: Negative.   Endocrine: Negative.    Musculoskeletal: Negative.   Neurological: Negative.   Hematological: Negative.   Psychiatric/Behavioral: Negative.   All other systems reviewed and are negative.    Objective: Vital Signs: Ht 6' (1.829 m)   Wt 280 lb (127 kg)   BMI 37.97 kg/m   Physical Exam Vitals and nursing note reviewed.  Constitutional:      Appearance: She is well-developed.  HENT:     Head: Normocephalic and atraumatic.  Pulmonary:     Effort: Pulmonary effort is normal.  Abdominal:     Palpations: Abdomen is soft.  Musculoskeletal:     Cervical back: Neck supple.  Skin:    General: Skin is warm.     Capillary Refill: Capillary refill takes less than 2 seconds.  Neurological:     Mental Status: She is alert and oriented to person, place, and time.  Psychiatric:        Behavior: Behavior normal.        Thought Content: Thought content normal.        Judgment: Judgment normal.     Ortho Exam Right knee shows medial joint line tenderness.  Moderate pain with range of motion and patellofemoral crepitus.  Collaterals and cruciates are stable.  Slight valgus alignment.  Negative McMurray.  Specialty Comments:  No specialty comments available.  Imaging: XR KNEE 3 VIEW RIGHT  Result Date: 09/15/2020 Tricompartmental degenerative changes severe in the patellofemoral compartment    PMFS History: Patient Active Problem List   Diagnosis Date Noted  . Family history of breast cancer   . Family history of pancreatic cancer   . Family history of lung cancer   . Family history of colon cancer   . 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  . Pelvic mass in female 07/23/2019  . Abnormal uterine bleeding 07/23/2019  . Acute cholecystitis 12/04/2018  . Essential hypertension 10/06/2015  . Hyperglycemia 10/06/2015  . Elevated LFTs 07/21/2014   Past Medical History:  Diagnosis Date  . Adnexal mass   . Allergy   . Diabetes mellitus    type 2  . Diabetes mellitus without complication ([facility])   .  Family history of breast cancer   . Family history of colon cancer   . Family history of lung cancer   . Family history of pancreatic cancer   . Gall bladder stones   . GERD (gastroesophageal reflux disease)   . History of infection due to human papilloma virus (HPV) 2019   per patient , now resolved   . Hypertension   . Obesity   . PCOS (polycystic ovarian syndrome)   . Personal history of cervical dysplasia   . 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 Hypertension Father   . Breast cancer Paternal Grandmother 42  . Lung cancer Paternal Grandfather 80  . Esophageal cancer Paternal Grandfather   . Pancreatic cancer Maternal Aunt 34  . Colon cancer Paternal Aunt 39  . Diabetes Maternal Grandmother   . Breast cancer Maternal Aunt 70  . Cancer Paternal Aunt        unk type  . Diabetes Maternal Aunt   . Ovarian cancer Neg Hx   . Endometrial cancer Neg Hx   . Colon polyps Neg Hx   . Rectal cancer Neg Hx   . Stomach cancer Neg Hx     Past Surgical History:  Procedure Laterality Date  . CHOLECYSTECTOMY N/A 12/05/2018   Procedure: LAPAROSCOPIC CHOLECYSTECTOMY WITH INTRAOPERATIVE CHOLANGIOGRAM;  Surgeon: Ovidio Kin, MD;  Location: [facility];  Service: General;  Laterality: N/A;  . KNEE ARTHROSCOPY Right 1993  . ROBOTIC ASSISTED SALPINGO OOPHERECTOMY N/A 08/05/2019   Procedure: XI ROBOTIC ASSISTED UNILATERAL   OOPHORECTOMY,BILATERAL SALPINGECTOMY;  Surgeon: Carver Fila, MD;  Location: [facility];  Service: Gynecology;  Laterality: N/A;   Social History   Occupational History  . Occupation: psot office  Tobacco Use  . Smoking status: Former Smoker    Types: Cigarettes    Quit date: 07/29/2017    Years since quitting: 3.1  . Smokeless tobacco: Never Used  Vaping Use  . Vaping Use: Never used  Substance and Sexual Activity  . Alcohol use: Yes    Comment: 1 bottle of wine each night   . Drug use: No  . Sexual activity: Yes     Birth control/protection: None

## 2020-11-17 ENCOUNTER — Ambulatory Visit (HOSPITAL_COMMUNITY)
Admission: RE | Admit: 2020-11-17 | Discharge: 2020-11-17 | Disposition: A | Discharge status: Home / Self Care | Payer: BLUE CROSS/BLUE SHIELD | Source: Ambulatory Visit | Attending: Emergency Medicine | Admitting: Emergency Medicine

## 2020-11-17 ENCOUNTER — Other Ambulatory Visit: Payer: Self-pay

## 2020-11-17 ENCOUNTER — Ambulatory Visit (INDEPENDENT_AMBULATORY_CARE_PROVIDER_SITE_OTHER): Payer: BLUE CROSS/BLUE SHIELD

## 2020-11-17 ENCOUNTER — Encounter (HOSPITAL_COMMUNITY): Payer: Self-pay

## 2020-11-17 VITALS — BP 141/90 | HR 78 | Temp 98.8°F | Resp 18

## 2020-11-17 DIAGNOSIS — S92515A Nondisplaced fracture of proximal phalanx of left lesser toe(s), initial encounter for closed fracture: Secondary | ICD-10-CM

## 2020-11-17 DIAGNOSIS — S92502A Displaced unspecified fracture of left lesser toe(s), initial encounter for closed fracture: Secondary | ICD-10-CM

## 2020-11-17 NOTE — ED Provider Notes (Signed)
[facility]    CSN: [id number] Arrival date & time: 11/17/20  1502      History   Chief Complaint Chief Complaint  Patient presents with  . Toe Pain  . Appointment    HPI Doris Armstrong is a 45 y.o. female.   Patient here for evaluation of left fourth toe.  Reports hitting toe on furniture yesterday with continued pain and swelling.  Reports icing it with minimal relief.  Reports pain worse when attempting to wear shoes.  Has not taken any OTC medications. Denies any fevers, chest pain, shortness of breath, N/V/D, numbness, tingling, weakness, abdominal pain, or headaches.    The history is provided by the patient.  Toe Pain    Past Medical History:  Diagnosis Date  . Adnexal mass   . Allergy   . Diabetes mellitus    type 2  . Diabetes mellitus without complication ([place])   . Family history of breast cancer   . Family history of colon cancer   . Family history of lung cancer   . Family history of pancreatic cancer   . Gall bladder stones   . GERD (gastroesophageal reflux disease)   . History of infection due to human papilloma virus (HPV) 2019   per patient , now resolved   . Hypertension   . Obesity   . PCOS (polycystic ovarian syndrome)   . Personal history of cervical dysplasia   . 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 Family history of breast cancer   . Family history of pancreatic cancer   . Family history of lung cancer   . Family history of colon cancer   . 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  . Pelvic mass in female 07/23/2019  . Abnormal uterine bleeding 07/23/2019  . Acute cholecystitis 12/04/2018  . Essential hypertension 10/06/2015  . Hyperglycemia 10/06/2015  . Elevated LFTs 07/21/2014    Past Surgical History:  Procedure Laterality Date  . CHOLECYSTECTOMY N/A 12/05/2018   Procedure: LAPAROSCOPIC CHOLECYSTECTOMY WITH INTRAOPERATIVE CHOLANGIOGRAM;  Surgeon: Alphonsa Overall, MD;  Location: [facility];  Service: General;  Laterality: N/A;  . KNEE ARTHROSCOPY Right 1993  . ROBOTIC ASSISTED SALPINGO OOPHERECTOMY N/A 08/05/2019   Procedure: XI ROBOTIC ASSISTED UNILATERAL   OOPHORECTOMY,BILATERAL SALPINGECTOMY;  Surgeon: Lafonda Mosses, MD;  Location: [facility];  Service: Gynecology;  Laterality: N/A;    OB History    Gravida  2   Para  1   Term      Preterm      AB  1   Living  0     SAB      IAB      Ectopic      Multiple      Live Births           Obstetric Comments  Adopted 1 child         Home Medications    Prior to Admission medications   Medication Sig Start Date End Date Taking? Authorizing Provider  BLACK ELDERBERRY PO Take 2 tablets by mouth 2 (two) times daily.    [provider]  diclofenac (VOLTAREN) 75 MG EC tablet Take 1 tablet (75 mg total) by mouth 2 (two) times daily. 09/15/20   Leandrew Koyanagi, MD  losartan-hydrochlorothiazide (HYZAAR) 100-12.5 MG tablet Take 1 tablet by mouth daily. 07/14/20   Wendie Agreste, MD  metformin Lenoard Aden)  500 MG (OSM) 24 hr tablet Take 2 tablets twice daily for diabetes. 07/31/20   Posey Boyer, MD    Family History Family History  Problem Relation Age of Onset  . Hypertension Father   . Breast cancer Paternal Grandmother 40  . Lung cancer Paternal Grandfather 37  . Esophageal cancer Paternal Grandfather   . Pancreatic cancer Maternal Aunt 67  . Colon cancer Paternal Aunt 79  . Diabetes Maternal Grandmother   . Breast cancer Maternal Aunt 70  . Cancer Paternal Aunt        unk type  . Diabetes Maternal Aunt   . Ovarian cancer Neg Hx   . Endometrial cancer Neg Hx   . Colon polyps Neg Hx   . Rectal cancer Neg Hx   . Stomach cancer Neg Hx     Social History Social History   Tobacco Use  . Smoking status: Former Smoker    Types: Cigarettes    Quit date: 07/29/2017    Years since quitting: 3.3  . Smokeless tobacco: Never Used  Vaping Use  . Vaping Use: Never  used  Substance Use Topics  . Alcohol use: Yes    Comment: 1 bottle of wine each night   . Drug use: No     Allergies   Lisinopril   Review of Systems Review of Systems  Musculoskeletal: Positive for arthralgias and joint swelling.  All other systems reviewed and are negative.    Physical Exam Triage Vital Signs ED Triage Vitals  Enc Vitals Group     BP 11/17/20 1545 (!) 141/90     Pulse Rate 11/17/20 1545 78     Resp 11/17/20 1545 18     Temp 11/17/20 1545 98.8 F (37.1 C)     Temp Source 11/17/20 1545 Oral     SpO2 11/17/20 1545 97 %     Weight --      Height --      Head Circumference --      Peak Flow --      Pain Score 11/17/20 1539 5     Pain Loc --      Pain Edu? --      Excl. in [place]? --    No data found.  Updated Vital Signs BP (!) 141/90 (BP Location: Right Arm)   Pulse 78   Temp 98.8 F (37.1 C) (Oral)   Resp 18   LMP 10/08/2020 (Exact Date)   SpO2 97%   Visual Acuity Right Eye Distance:   Left Eye Distance:   Bilateral Distance:    Right Eye Near:   Left Eye Near:    Bilateral Near:     Physical Exam Vitals and nursing note reviewed.  Constitutional:      General: She is not in acute distress.    Appearance: Normal appearance. She is not ill-appearing, toxic-appearing or diaphoretic.  HENT:     Head: Normocephalic and atraumatic.  Eyes:     Conjunctiva/sclera: Conjunctivae normal.  Cardiovascular:     Rate and Rhythm: Normal rate.     Pulses: Normal pulses.  Pulmonary:     Effort: Pulmonary effort is normal.  Abdominal:     General: Abdomen is flat.  Musculoskeletal:        General: Normal range of motion.     Cervical back: Normal range of motion.     Right foot: Normal.     Left foot: Normal capillary refill. Swelling (fourth toe), tenderness and bony tenderness  present. No crepitus. Normal pulse.  Skin:    General: Skin is warm and dry.  Neurological:     General: No focal deficit present.     Mental Status: She is alert  and oriented to person, place, and time.  Psychiatric:        Mood and Affect: Mood normal.      UC Treatments / Results  Labs (all labs ordered are listed, but only abnormal results are displayed) Labs Reviewed - No data to display  EKG   Radiology DG Toe 4th Left  Result Date: 11/17/2020 CLINICAL DATA:  45 year old female with trauma to the left fourth toe. EXAM: LEFT FOURTH TOE COMPARISON:  None. FINDINGS: Mildly displaced oblique fracture of the proximal phalanx of the fourth toe with mild lateral subluxation of the distal fracture fragment. No dislocation. No arthritic changes. The soft tissue swelling of the fourth toe. No radiopaque foreign object or soft tissue gas. IMPRESSION: Mildly displaced oblique fracture of the proximal phalanx of the fourth toe. Electronically Signed   By: Anner Crete M.D.   On: 11/17/2020 16:22    Procedures Procedures (including critical care time)  Medications Ordered in UC Medications - No data to display  Initial Impression / Assessment and Plan / UC Course  I have reviewed the triage vital signs and the nursing notes.  Pertinent labs & imaging results that were available during my care of the patient were reviewed by me and considered in my medical decision making (see chart for details).    Xray shoes mildly displaced oblique fracture of the proximal phalanx of the fourth toe.  Discussed case with Elsie Ra PA and ok for patient to follow up with ortho outpatient in the next week.  Toes buddy taped and post op shoe applied in office.  Patient may bear weight.  Ibuprofen and/or Tylenol as needed for pain.  Rest, ice, and elevate foot when sitting or laying down.  Follow up with [facility].   Final Clinical Impressions(s) / UC Diagnoses   Final diagnoses:  Closed fracture of phalanx of left fourth toe, initial encounter     Discharge Instructions     Wear the post op shoe and keep your toes taped.    You can take Ibuprofen  and/or Tylenol as needed for pain.    Follow up with [facility] [phone]) next week.    RICE: Rest as much as possible Ice for 10-15 minutes every 4-6 hours as needed for pain and swelling Compression- use an ace bandage or splint for comfort Elevate above your hip/heart when sitting and laying down       ED Prescriptions    None     PDMP not reviewed this encounter.   Pearson Forster, NP 11/17/20 [phone]

## 2020-11-17 NOTE — ED Triage Notes (Signed)
Pt c/o pain to the left fourth toe. Pt states she hit her toe yesterday and states it might be broken.

## 2020-11-17 NOTE — Discharge Instructions (Addendum)
Wear the post op shoe and keep your toes taped.    You can take Ibuprofen and/or Tylenol as needed for pain.    Follow up with [facility] [phone]) next week.    RICE: Rest as much as possible Ice for 10-15 minutes every 4-6 hours as needed for pain and swelling Compression- use an ace bandage or splint for comfort Elevate above your hip/heart when sitting and laying down

## 2020-12-01 ENCOUNTER — Ambulatory Visit: Payer: BLUE CROSS/BLUE SHIELD | Admitting: Orthopaedic Surgery

## 2020-12-01 ENCOUNTER — Ambulatory Visit: Payer: Self-pay

## 2020-12-01 ENCOUNTER — Encounter: Payer: Self-pay | Admitting: Orthopaedic Surgery

## 2020-12-01 DIAGNOSIS — S92503A Displaced unspecified fracture of unspecified lesser toe(s), initial encounter for closed fracture: Secondary | ICD-10-CM

## 2020-12-01 DIAGNOSIS — M79672 Pain in left foot: Secondary | ICD-10-CM

## 2020-12-01 NOTE — Progress Notes (Signed)
Office Visit Note   Patient: Doris Armstrong           Date of Birth: 18-Oct-1975           MRN: [id number] Visit Date: 12/01/2020              Requested by: Peyton Najjar, MD No address on file PCP: Peyton Najjar, MD (Inactive)   Assessment & Plan: Visit Diagnoses:  1. Pain in left foot   2. Closed fracture of phalanx of fourth toe     Plan: Impression is 2-week status post left foot fourth toe proximal phalanx fracture.  At this point, radiographically she has not displaced the fracture further compared to imaging from 1 week ago.  There is evidence of callus formation today.  Her pain is improved and there is no longer deformity to the fourth toe.  At this point, we will continue to treat this conservatively.  We will continue with buddy tape and postoperative shoe.  She will follow-up with [place] in 2 to 3 weeks time for repeat evaluation and three-view x-rays of the left foot.  Call with concerns or questions in the meantime.  Follow-Up Instructions: Return in about 2 weeks (around 12/15/2020).   Orders:  Orders Placed This Encounter  Procedures   XR Foot Complete Left   No orders of the defined types were placed in this encounter.     Procedures: No procedures performed   Clinical Data: No additional findings.   Subjective: Chief Complaint  Patient presents with   Left Foot - Pain    HPI patient is a pleasant 45 year old female who comes in today following an injury to her left fourth toe.  On 11/16/2020, she accidentally kicked a couch causing pain to the left fourth toe.  She was seen the following day where x-rays were obtained.  X-rays demonstrated a displaced fracture through the fourth toe.  She was placed in a postoperative shoe and has been using buddy tape since.  Her pain has improved.  She has not requiring any over-the-counter pain medication.  She is working and does note some swelling and discomfort at the end of the day.  Review of Systems as detailed  in HPI.  All others reviewed and are negative.   Objective: Vital Signs: There were no vitals taken for this visit.  Physical Exam well-developed well-nourished female no acute distress.  Alert and oriented x3.  Ortho Exam left fourth toe exam shows no obvious deformity.  Mild swelling.  No ecchymosis.  She does have mild tenderness to the fracture site but does have even more tenderness to the metatarsal head.  She is neurovascularly intact distally.  Specialty Comments:  No specialty comments available.  Imaging: XR Foot Complete Left  Result Date: 12/01/2020 X-rays reveal a stable displaced fracture through the proximal phalanx of the fourth toe.    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   Pelvic mass in female 07/23/2019   Abnormal uterine bleeding 07/23/2019   Acute cholecystitis 12/04/2018   Essential hypertension 10/06/2015   Hyperglycemia 10/06/2015   Elevated LFTs 07/21/2014   Past Medical History:  Diagnosis Date   Adnexal mass    Allergy    Diabetes  mellitus    type 2   Diabetes mellitus without complication (HCC)    Family history of breast cancer    Family history of colon cancer    Family history of lung cancer    Family history of pancreatic cancer    Gall bladder stones    GERD (gastroesophageal reflux disease)    History of infection due to human papilloma virus (HPV) 2019   per patient , now resolved    Hypertension    Obesity    PCOS (polycystic ovarian syndrome)    Personal history of cervical dysplasia    Sex cord tumor of ovary, 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    Family History  Problem Relation Age of Onset   Hypertension Father    Breast cancer Paternal Grandmother 69   Lung cancer Paternal Grandfather 38   Esophageal cancer Paternal Grandfather     Pancreatic cancer Maternal Aunt 67   Colon cancer Paternal Aunt 15   Diabetes Maternal Grandmother    Breast cancer Maternal Aunt 74   Cancer Paternal Aunt        unk type   Diabetes Maternal Aunt    Ovarian cancer Neg Hx    Endometrial cancer Neg Hx    Colon polyps Neg Hx    Rectal cancer Neg Hx    Stomach cancer Neg Hx     Past Surgical History:  Procedure Laterality Date   CHOLECYSTECTOMY N/A 12/05/2018   Procedure: LAPAROSCOPIC CHOLECYSTECTOMY WITH INTRAOPERATIVE CHOLANGIOGRAM;  Surgeon: Ovidio Kin, MD;  Location: [facility];  Service: General;  Laterality: N/A;   KNEE ARTHROSCOPY Right 1993   ROBOTIC ASSISTED SALPINGO OOPHERECTOMY N/A 08/05/2019   Procedure: XI ROBOTIC ASSISTED UNILATERAL   OOPHORECTOMY,BILATERAL SALPINGECTOMY;  Surgeon: Carver Fila, MD;  Location: [facility];  Service: Gynecology;  Laterality: N/A;   Social History   Occupational History   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

## 2021-02-02 ENCOUNTER — Other Ambulatory Visit: Payer: Self-pay

## 2021-02-02 ENCOUNTER — Ambulatory Visit: Payer: BLUE CROSS/BLUE SHIELD | Admitting: Podiatry

## 2021-02-02 DIAGNOSIS — B351 Tinea unguium: Secondary | ICD-10-CM | POA: Diagnosis not present

## 2021-02-02 MED ORDER — CICLOPIROX 8 % EX SOLN: Freq: Every day | CUTANEOUS | 0 refills | Status: DC

## 2021-02-02 NOTE — Progress Notes (Signed)
Subjective:  Patient ID: Doris Armstrong, female    DOB: 1975/09/07,  MRN: [id number]  Chief Complaint  Patient presents with   Nail Problem    Left foot great toe ingrown. Pt states she thinks she may have dug it all out. Pt also is requesting topical medication for nail fungus.     45 y.o. female presents with the above complaint.  Patient presents with complaint of bilateral hallux onychomycosis.  Patient states is painful to touch.  Patient states that she has a high liver enzyme as well as she is diabetic with last A1c of 9.8.  She would like to discuss topical medication for this.  She does not want to try anything else.  She denies any other acute complaints.  She has not seen anyone else prior to seeing me.   Review of Systems: Negative except as noted in the HPI. Denies N/V/F/Ch.  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 Apply over nail and surrounding skin. Apply daily over previous coat. After seven (7) days, may remove with alcohol and continue cycle., Disp: 6.6 mL, Rfl: 0   BLACK ELDERBERRY PO, Take 2 tablets by mouth 2 (two) times daily., Disp: , Rfl:    diclofenac (VOLTAREN) 75 MG EC tablet, Take 1 tablet (75 mg total) by mouth 2 (two) times daily., Disp: 30 tablet, Rfl: 2   losartan-hydrochlorothiazide (HYZAAR) 100-12.5 MG  tablet, Take 1 tablet by mouth daily., Disp: 90 tablet, Rfl: 3   metformin (FORTAMET) 500 MG (OSM) 24 hr tablet, Take 2 tablets twice daily for diabetes., Disp: 360 tablet, Rfl: 3  Social History   Tobacco Use  Smoking Status Former   Types: Cigarettes   Quit date: 07/29/2017   Years since quitting: 3.5  Smokeless Tobacco Never    Allergies  Allergen Reactions   Lisinopril Swelling   Objective:  There were no vitals filed for this visit. There is no height or weight on file to calculate BMI. Constitutional Well developed. Well nourished.  Vascular Dorsalis pedis pulses palpable bilaterally. Posterior tibial pulses palpable bilaterally. Capillary refill normal to all digits.  No cyanosis or clubbing noted. Pedal hair growth normal.  Neurologic Normal speech. Oriented to person, place, and time. Epicritic sensation to light touch grossly present bilaterally.  Dermatologic Bilateral hallux thickened elongated dystrophic toenails x2.  Mild pain on palpation.  Orthopedic: Normal joint ROM without pain or crepitus bilaterally. No visible deformities. No bony tenderness.   Radiographs: None Assessment:   1. Onychomycosis due to dermatophyte   2. Nail fungus    Plan:  Patient was evaluated and treated and all questions answered.  Bilateral hallux onychomycosis -Educated the patient on the etiology  of onychomycosis and various treatment options associated with improving the fungal load.  I explained to the patient that there is 3 treatment options available to treat the onychomycosis including topical, p.o., laser treatment.  Patient elected to undergo topical medication.  Penlac was sent to the pharmacy of asked her to apply twice a day for up to 6 to 8 months.  Patient states understanding.  She is not an ideal candidate given her high liver enzyme for Lamisil therapy.   No follow-ups on file.

## 2021-10-29 NOTE — Telephone Encounter (Signed)
NA

## 2021-11-26 ENCOUNTER — Ambulatory Visit: Payer: BLUE CROSS/BLUE SHIELD

## 2021-12-01 ENCOUNTER — Encounter: Payer: Self-pay | Admitting: Emergency Medicine

## 2021-12-01 ENCOUNTER — Ambulatory Visit
Admission: EM | Admit: 2021-12-01 | Discharge: 2021-12-01 | Disposition: A | Discharge status: Home / Self Care | Payer: Federal, State, Local not specified - PPO | Attending: Emergency Medicine | Admitting: Emergency Medicine

## 2021-12-01 ENCOUNTER — Other Ambulatory Visit: Payer: Self-pay

## 2021-12-01 DIAGNOSIS — H109 Unspecified conjunctivitis: Secondary | ICD-10-CM | POA: Diagnosis not present

## 2021-12-01 MED ORDER — CIPROFLOXACIN HCL 0.3 % OP SOLN: OPHTHALMIC | 0 refills | Status: AC

## 2021-12-01 NOTE — ED Triage Notes (Signed)
Pt here for left eye irritation and drainage x 2 days

## 2021-12-01 NOTE — Discharge Instructions (Signed)
Please begin ciprofloxacin eyedrops as directed on the bottle.  If you have not had meaningful improvement of your symptoms after 7 days of treatment with ciprofloxacin eyedrops, please follow-up with your primary care provider or return here to urgent care for further evaluation.

## 2021-12-02 NOTE — ED Provider Notes (Signed)
[name]    CSN: [id number] Arrival date & time: 12/01/21  1497    HISTORY   Chief Complaint  Patient presents with   Conjunctivitis    Pt c/o eye drainage feeling scratchy yesterday. Also congestion coughing and sore throat x1 week. - Entered by patient   HPI Doris Armstrong is a 46 y.o. female. Patient presents to urgent care complaining of left eye irritation and drainage for the past 2 days.  The drainage has been at times thick, usually this is worse in the morning but has been persistent throughout the daytime hours.  Patient states one of her children has had bacterial conjunctivitis.  Patient states she thinks she caught something from them.  Patient states she is also recently had a mild cold but that this is improved over the past few days.  Patient has a significantly elevated blood pressure on arrival today.  Patient states not currently taking blood pressure medicine "as she should".  The history is provided by the patient.   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   Patient Active Problem List   Diagnosis Date Noted   Family history of breast cancer    Family history of pancreatic cancer    Family history of lung cancer    Family history of colon cancer    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   Pelvic mass in female 07/23/2019   Abnormal uterine bleeding 07/23/2019   Acute cholecystitis  12/04/2018   Essential hypertension 10/06/2015   Hyperglycemia 10/06/2015   Elevated LFTs 07/21/2014   Past Surgical History:  Procedure Laterality Date   CHOLECYSTECTOMY N/A 12/05/2018   Procedure: LAPAROSCOPIC CHOLECYSTECTOMY WITH INTRAOPERATIVE CHOLANGIOGRAM;  Surgeon: Alphonsa Overall, MD;  Location: [facility];  Service: General;  Laterality: N/A;   KNEE ARTHROSCOPY Right 1993   ROBOTIC ASSISTED SALPINGO OOPHERECTOMY N/A 08/05/2019   Procedure: XI ROBOTIC ASSISTED UNILATERAL   [place] SALPINGECTOMY;  Surgeon: Lafonda Mosses, MD;  Location: [facility];  Service: Gynecology;  Laterality: N/A;   OB History     Gravida  2   Para  1   Term      Preterm      AB  1   Living  0      SAB      IAB      Ectopic      Multiple      Live Births           Obstetric Comments  Adopted 1 child        Home Medications    Prior to Admission medications   Medication Sig Start Date End Date Taking? Authorizing Provider  ciprofloxacin (CILOXAN) 0.3 % ophthalmic solution Administer 1 drop, every 2 hours, while awake, for 2 days.  Then 1 drop, every 4 hours, while awake, for the next 5 days. 12/01/21  Yes Lynden [place] Scales, PA-C  BLACK ELDERBERRY PO Take 2 tablets by mouth 2 (two) times daily.    [provider]  ciclopirox (PENLAC) 8 % solution Apply topically at bedtime. Apply over nail and surrounding skin. Apply daily over previous coat. After seven (7) days, may remove with alcohol and continue cycle. 02/02/21   Felipa Furnace, DPM  diclofenac (VOLTAREN) 75 MG EC tablet Take 1 tablet (75 mg total) by mouth 2 (two) times daily. 09/15/20   Leandrew Koyanagi, MD  losartan-hydrochlorothiazide (HYZAAR) 100-12.5 MG tablet Take 1 tablet by mouth daily. 07/14/20   Wendie Agreste, MD  metformin (FORTAMET) 500 MG (OSM) 24 hr tablet Take 2 tablets twice daily for diabetes. 07/31/20   Posey Boyer, MD   Family History Family History  Problem Relation Age of Onset    Hypertension Father    Breast cancer Paternal Grandmother 28   Lung cancer Paternal Grandfather 42   Esophageal cancer Paternal Grandfather    Pancreatic cancer Maternal Aunt 67   Colon cancer Paternal Aunt 79   Diabetes Maternal Grandmother    Breast cancer Maternal Aunt 77   Cancer Paternal Aunt        unk type   Diabetes Maternal Aunt    Ovarian cancer Neg Hx    Endometrial cancer Neg Hx    Colon polyps Neg Hx    Rectal cancer Neg Hx    Stomach cancer Neg Hx    Social History Social History   Tobacco Use   Smoking status: Former    Types: Cigarettes    Quit date: 07/29/2017    Years since quitting: 4.3   Smokeless tobacco: Never  Vaping Use   Vaping Use: Never used  Substance Use Topics   Alcohol use: Yes    Comment: 1 bottle of wine each night    Drug use: No   Allergies   Lisinopril  Review of Systems Review of Systems Pertinent findings noted in history of present illness.   Physical Exam Triage Vital Signs ED Triage Vitals  Enc Vitals Group     BP 04/20/21 0827 (!) 147/82     Pulse Rate 04/20/21 0827 72     Resp 04/20/21 0827 18     Temp 04/20/21 0827 98.3 F (36.8 C)     Temp Source 04/20/21 0827 Oral     SpO2 04/20/21 0827 98 %     Weight --      Height --      Head Circumference --      Peak Flow --      Pain Score 04/20/21 0826 5     Pain Loc --      Pain Edu? --      Excl. in [place]? --   No data found.  Updated Vital Signs BP (!) 168/101 (BP Location: Left Arm)   Pulse 83   Temp 98.3 F (36.8 C) (Oral)   Resp 18   SpO2 96%   Physical Exam Vitals and nursing note reviewed.  Constitutional:      General: She is not in acute distress.    Appearance: Normal appearance. She is not ill-appearing.  HENT:     Head: Normocephalic and atraumatic.     Salivary Glands: Right salivary gland is not diffusely enlarged or tender. Left salivary gland is not diffusely enlarged or tender.     Right  Ear: Tympanic membrane, ear canal and external ear  normal. No drainage. No middle ear effusion. There is no impacted cerumen. Tympanic membrane is not erythematous or bulging.     Left Ear: Tympanic membrane, ear canal and external ear normal. No drainage.  No middle ear effusion. There is no impacted cerumen. Tympanic membrane is not erythematous or bulging.     Nose: Nose normal. No nasal deformity, septal deviation, mucosal edema, congestion or rhinorrhea.     Right Turbinates: Not enlarged, swollen or pale.     Left Turbinates: Not enlarged, swollen or pale.     Right Sinus: No maxillary sinus tenderness or frontal sinus tenderness.     Left Sinus: No maxillary sinus tenderness or frontal sinus tenderness.     Mouth/Throat:     Lips: Pink. No lesions.     Mouth: Mucous membranes are moist. No oral lesions.     Pharynx: Oropharynx is clear. Uvula midline. No posterior oropharyngeal erythema or uvula swelling.     Tonsils: No tonsillar exudate. 0 on the right. 0 on the left.  Eyes:     General: Lids are normal. Lids are everted, no foreign bodies appreciated. Vision grossly intact. No allergic shiner, visual field deficit or scleral icterus.       Right eye: No foreign body, discharge or hordeolum.        Left eye: Discharge present.No foreign body or hordeolum.     Extraocular Movements: Extraocular movements intact.     Right eye: Normal extraocular motion and no nystagmus.     Left eye: Normal extraocular motion and no nystagmus.     Conjunctiva/sclera:     Right eye: Right conjunctiva is not injected. No chemosis, exudate or hemorrhage.    Left eye: Left conjunctiva is injected. Exudate present. No chemosis or hemorrhage.    Pupils: Pupils are equal, round, and reactive to light.  Neck:     Trachea: Trachea and phonation normal.  Cardiovascular:     Rate and Rhythm: Normal rate and regular rhythm.     Pulses: Normal pulses.     Heart sounds: Normal heart sounds. No murmur heard.    No friction rub. No gallop.  Pulmonary:      Effort: Pulmonary effort is normal. No accessory muscle usage, prolonged expiration or respiratory distress.     Breath sounds: Normal breath sounds. No stridor, decreased air movement or transmitted upper airway sounds. No decreased breath sounds, wheezing, rhonchi or rales.  Chest:     Chest wall: No tenderness.  Musculoskeletal:        General: Normal range of motion.     Cervical back: Normal range of motion and neck supple. Normal range of motion.  Lymphadenopathy:     Cervical: No cervical adenopathy.  Skin:    General: Skin is warm and dry.     Findings: No erythema or rash.  Neurological:     General: No focal deficit present.     Mental Status: She is alert and oriented to person, place, and time.  Psychiatric:        Mood and Affect: Mood normal.        Behavior: Behavior normal.     Visual Acuity Right Eye Distance:   Left Eye Distance:   Bilateral Distance:    Right Eye Near:   Left Eye Near:    Bilateral Near:     UC Couse / Diagnostics / Procedures:    EKG  Radiology No results found.  Procedures Procedures (including critical care time)  UC Diagnoses / Final Clinical Impressions(s)   I have reviewed the triage vital signs and the nursing notes.  Pertinent labs & imaging results that were available during my care of the patient were reviewed by me and considered in my medical decision making (see chart for details).   Final diagnoses:  Bacterial conjunctivitis of left eye   Agree with patient that this is likely bacterial conjunctivitis, patient provided with ciprofloxacin eyedrops and advised to follow-up with neurology or primary care if symptoms do not improve.  ED Prescriptions     Medication Sig Dispense Auth. Provider   ciprofloxacin (CILOXAN) 0.3 % ophthalmic solution Administer 1 drop, every 2 hours, while awake, for 2 days. Then 1 drop, every 4 hours, while awake, for the next 5 days. 5 mL Lynden [place] Scales, PA-C      PDMP not  reviewed this encounter.  Pending results:  Labs Reviewed - No data to display  Medications Ordered in UC: Medications - No data to display  Disposition Upon Discharge:  Condition: stable for discharge home Home: take medications as prescribed; routine discharge instructions as discussed; follow up as advised.  Patient presented with an acute illness with associated systemic symptoms and significant discomfort requiring urgent management. In my opinion, this is a condition that a prudent lay person (someone who possesses an average knowledge of health and medicine) may potentially expect to result in complications if not addressed urgently such as respiratory distress, impairment of bodily function or dysfunction of bodily organs.   Routine symptom specific, illness specific and/or disease specific instructions were discussed with the patient and/or caregiver at length.   As such, the patient has been evaluated and assessed, work-up was performed and treatment was provided in alignment with urgent care protocols and evidence based medicine.  Patient/parent/caregiver has been advised that the patient may require follow up for further testing and treatment if the symptoms continue in spite of treatment, as clinically indicated and appropriate.  If the patient was tested for COVID-19, Influenza and/or RSV, then the patient/parent/guardian was advised to isolate at home pending the results of his/her diagnostic coronavirus test and potentially longer if they're positive. I have also advised pt that if his/her COVID-19 test returns positive, it's recommended to self-isolate for at least 10 days after symptoms first appeared AND until fever-free for 24 hours without fever reducer AND other symptoms have improved or resolved. Discussed self-isolation recommendations as well as instructions for household member/close contacts as per the [facility] and [place] DHHS, and also gave patient the [place] packet with this  information.  Patient/parent/caregiver has been advised to return to the [facility] or PCP in 3-5 days if no better; to PCP or the Emergency Department if new signs and symptoms develop, or if the current signs or symptoms continue to change or worsen for further workup, evaluation and treatment as clinically indicated and appropriate  The patient will follow up with their current PCP if and as advised. If the patient does not currently have a PCP we will assist them in obtaining one.   The patient may need specialty follow up if the symptoms continue, in spite of conservative treatment and management, for further workup, evaluation, consultation and treatment as clinically indicated and appropriate.  Patient/parent/caregiver verbalized understanding and agreement of plan as discussed.  All questions were addressed during visit.  Please see discharge instructions below for further details of plan.  Discharge Instructions:   Discharge Instructions  Please begin ciprofloxacin eyedrops as directed on the bottle.  If you have not had meaningful improvement of your symptoms after 7 days of treatment with ciprofloxacin eyedrops, please follow-up with your primary care provider or return here to urgent care for further evaluation.    This office note has been dictated using Museum/gallery curator.  Unfortunately, and despite my best efforts, this method of dictation can sometimes lead to occasional typographical or grammatical errors.  I apologize in advance if this occurs.     Lynden [place] Scales, PA-C 12/02/21 [phone]

## 2022-01-13 IMAGING — CT CT ABD-PELV W/ CM
1 of 3 series · 13 of 32 positions shown, 18 images · IV contrast (APPLIED)
Comparison: MRI abdomen from 05/27/2010 and abdominal ultrasound
from 12/04/2018

CLINICAL DATA: Right adnexal mass on outside ultrasound, for
further characterization.

EXAM:
CT ABDOMEN AND PELVIS WITH CONTRAST
TECHNIQUE: Multidetector CT imaging of the abdomen and pelvis was performed
using the standard protocol following bolus administration of
intravenous contrast.
CONTRAST:  125mL NZFCDA-155 IOPAMIDOL (NZFCDA-155) INJECTION 61%

[Series 2: abd/pelvis w/cm · axial · 0.96mm/px · z∈[-504,-59]mm · 13 of 103 slices shown, 18 images]
[im 7/103  soft-tissue]
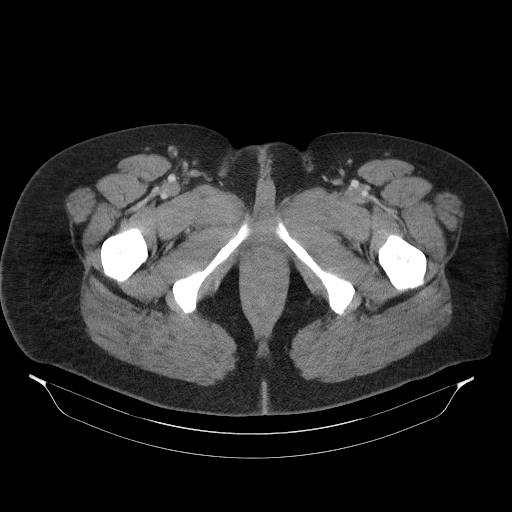
[im 7/103  bone]
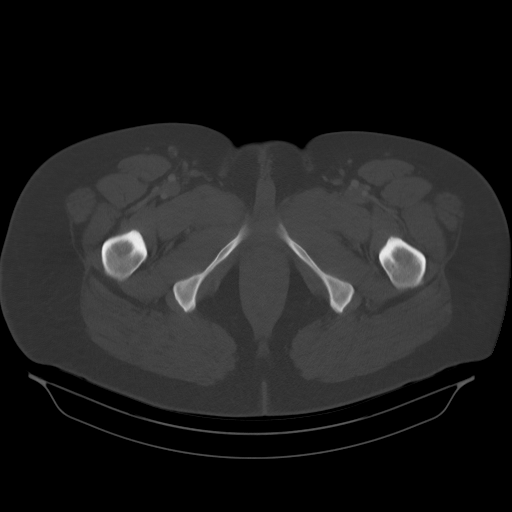
[im 14/103  soft-tissue]
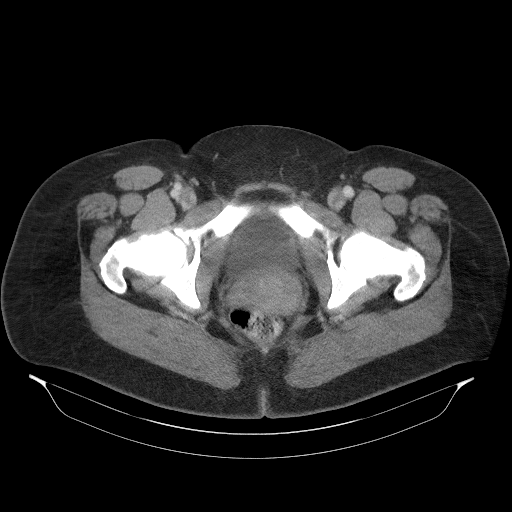
[im 21/103  soft-tissue]
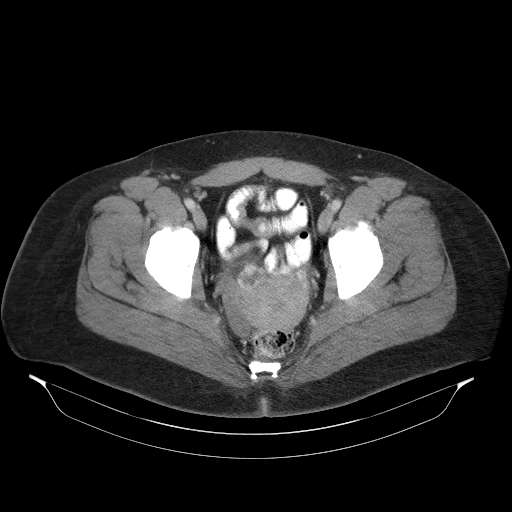
[im 35/103  soft-tissue]
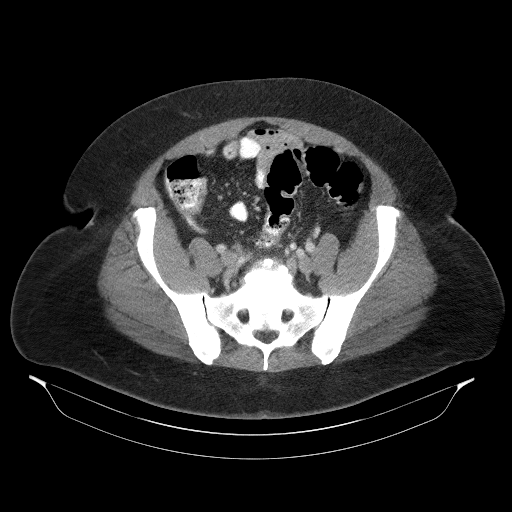
[im 41/103  soft-tissue]
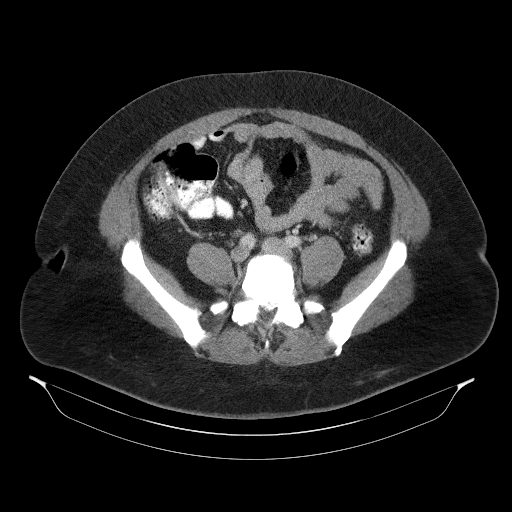
[im 48/103  soft-tissue]
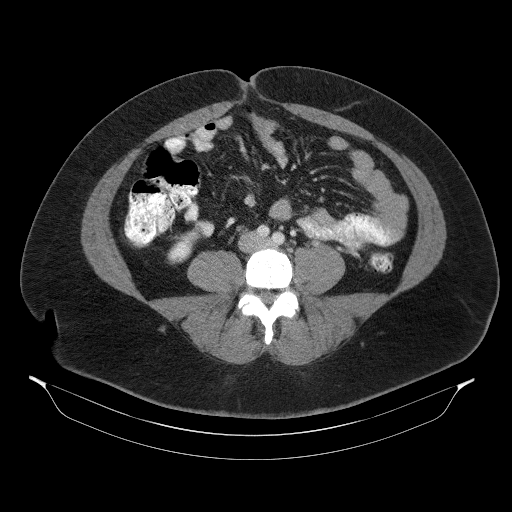
[im 55/103  soft-tissue]
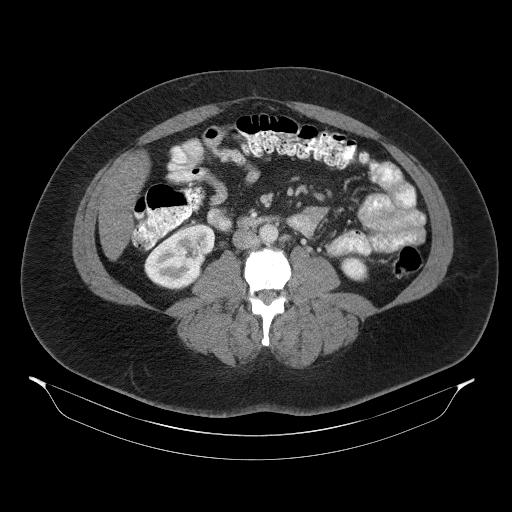
[im 62/103  soft-tissue]
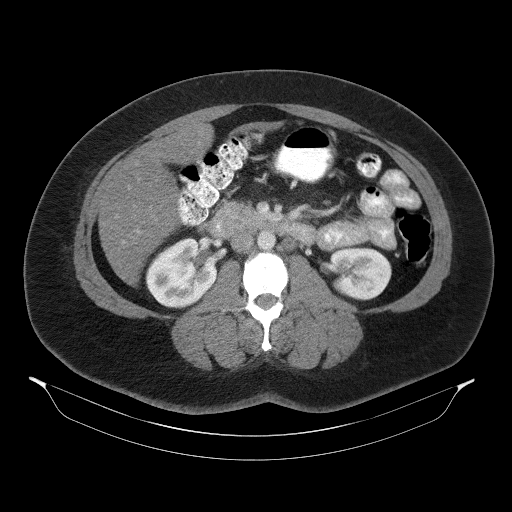
[im 69/103  soft-tissue]
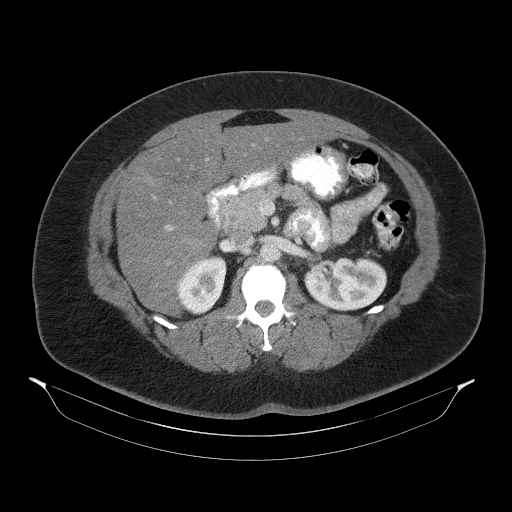
[im 69/103  bone]
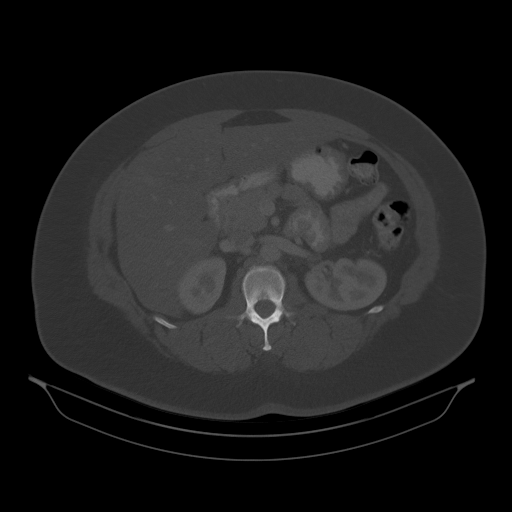
[im 75/103  lung]
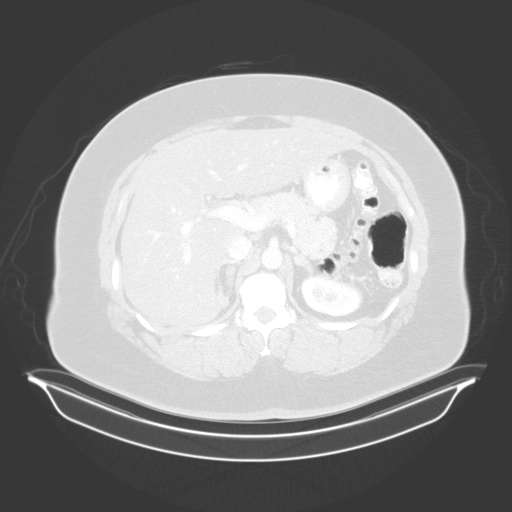
[im 82/103  soft-tissue]
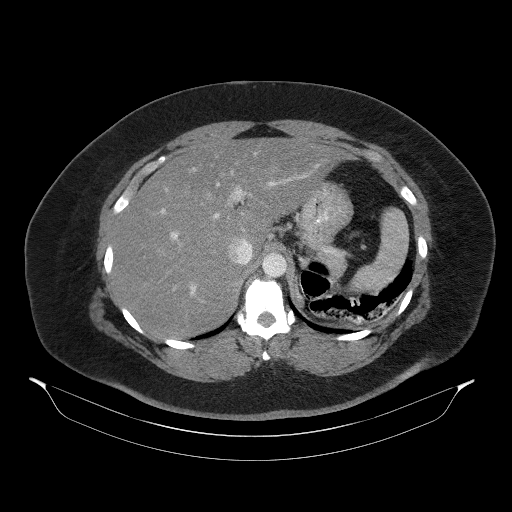
[im 82/103  lung]
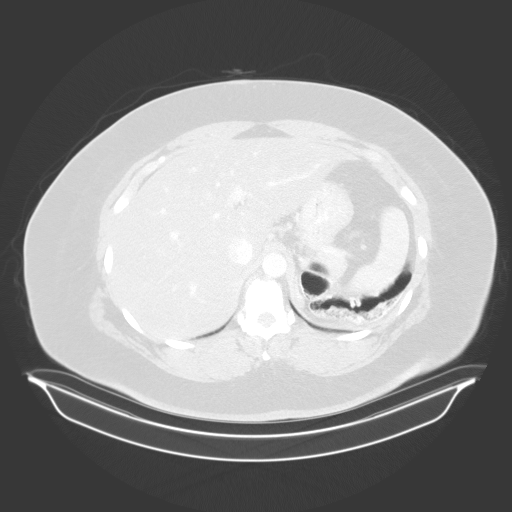
[im 89/103  soft-tissue]
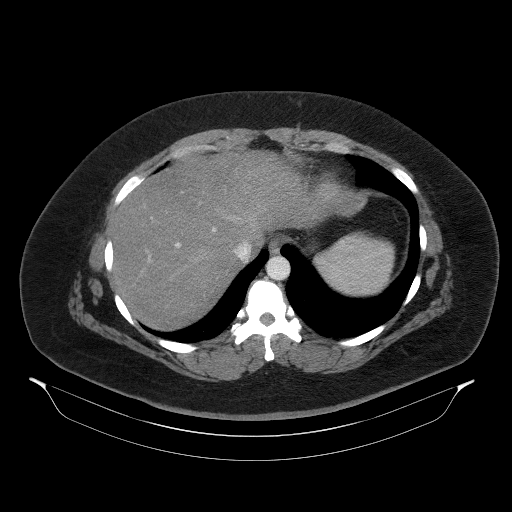
[im 89/103  lung]
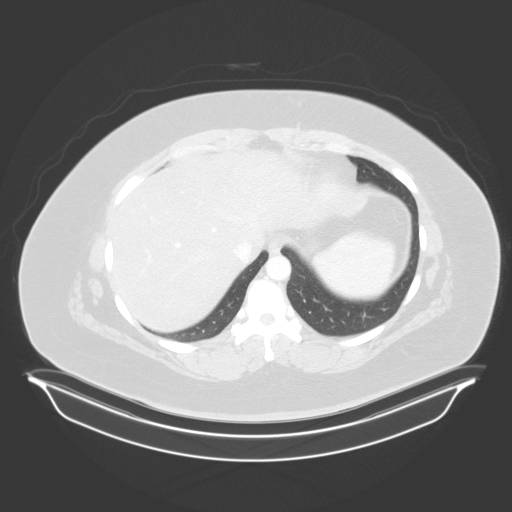
[im 96/103  soft-tissue]
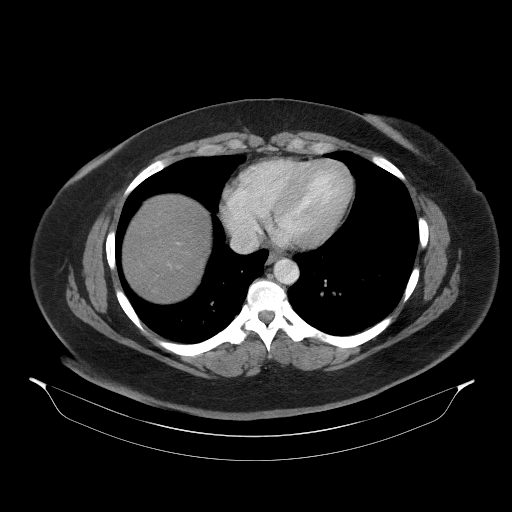
[im 96/103  lung]
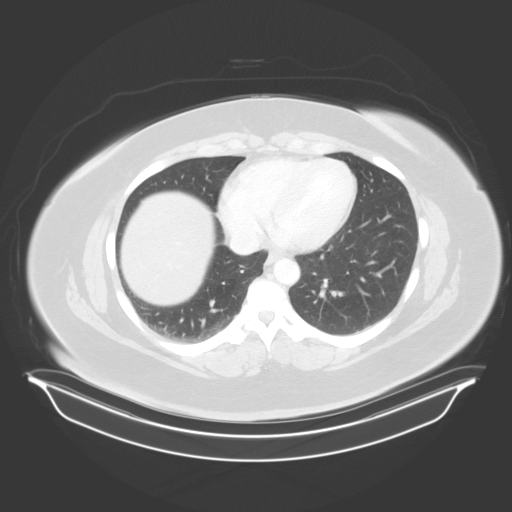

[13 of 32 positions shown; findings below may reference images not displayed]

FINDINGS: Lower chest: Unremarkable

Hepatobiliary: Suspected hepatic steatosis. Cholecystectomy. No
appreciable focal liver lesion.

Pancreas: Unremarkable

Spleen: Unremarkable

Adrenals/Urinary Tract: 1.0 by 1.5 cm right adrenal nodule, roughly
similar size from 05/27/2010, nonspecific but probably benign. The
kidneys and urinary bladder appear unremarkable.

Stomach/Bowel: Unremarkable

Vascular/Lymphatic: Left periaortic lymph node 1.2 cm in short axis
on image 41/2, previously 0.8 cm on 05/27/2010.

Reproductive: 7.0 by 6.1 by 5.7 cm right adnexal mass demonstrates
low density elements as well as enhancing elements, with some of the
associated venous drainage probably from the left renal vein which
is interesting giving the right eccentricity of the lesion.
Difficult to separately identify the left ovary, and although this
lesion is eccentric to the right at may be associated with the left
ovary. 3.9 by 2.1 cm structure along the posteroinferior margin of
this lesion on image 85/2 probably represents right ovarian tissue.

Retroverted uterus.

Other: No appreciable ascites or nodularity along the mesentery or
omentum.

Musculoskeletal: Spurring at L5-S1 contributing to considerable
bilateral foraminal impingement. There is loss of disc height and
endplate sclerosis at this level and posterior intervertebral
spurring causes borderline central narrowing of the thecal sac.
IMPRESSION: 1. A likely cystic and solid mass along the right dorsal side of the
uterus appears to have venous drainage through the left ovarian
vein, and could represent a left ovarian lesion which is simply
grown eccentric to the right side, or a right adnexal lesion with
tumor vascularity. This lesion abuts the anterior superior margin of
the right ovary and measures up to 7.0 cm. The lesion size and
internal enhancement favor malignant etiology although some benign
cystic and solid ovarian masses could have a similar appearance.
Correlation with the prior sonographic appearance is recommended
along with correlation with N5-9H4 level or other tumor markers.
Surgical evaluation should be considered.
2. Mildly enlarged left periaortic lymph node at 1.2 cm in short
axis (previously 0.8 cm on 05/27/2010). Although nonspecific this
does raise the possibility of a malignant lymph node, and should at
least be surveilled.
3. Other imaging findings of potential clinical significance:
Hepatic steatosis. Chronic right adrenal nodule, likely benign.
Spurring causes notable impingement at L5-S1.

## 2022-05-07 ENCOUNTER — Ambulatory Visit: Payer: Federal, State, Local not specified - PPO | Admitting: Internal Medicine

## 2022-06-11 ENCOUNTER — Ambulatory Visit: Payer: Federal, State, Local not specified - PPO | Admitting: Internal Medicine

## 2022-10-02 ENCOUNTER — Ambulatory Visit
Admission: EM | Admit: 2022-10-02 | Discharge: 2022-10-02 | Disposition: A | Discharge status: Home / Self Care | Payer: Federal, State, Local not specified - PPO | Attending: Internal Medicine | Admitting: Internal Medicine

## 2022-10-02 DIAGNOSIS — R21 Rash and other nonspecific skin eruption: Secondary | ICD-10-CM | POA: Diagnosis not present

## 2022-10-02 MED ORDER — CETIRIZINE HCL 10 MG PO TABS: 10.0000 mg | ORAL_TABLET | Freq: Every day | ORAL | 0 refills | Status: AC

## 2022-10-02 MED ORDER — FAMOTIDINE 20 MG PO TABS: 20.0000 mg | ORAL_TABLET | Freq: Two times a day (BID) | ORAL | 0 refills | Status: DC

## 2022-10-02 MED ORDER — BETAMETHASONE DIPROPIONATE 0.05 % EX CREA
TOPICAL_CREAM | Freq: Every day | CUTANEOUS | 0 refills | Status: DC
Start: 2022-10-02 — End: 2024-03-24

## 2022-10-02 NOTE — Discharge Instructions (Signed)
I have refilled your cream and prescribed cetirizine and Pepcid to alleviate rash.  Please follow-up if any symptoms persist or worsen.

## 2022-10-02 NOTE — ED Provider Notes (Signed)
[name]    CSN: [id number] Arrival date & time: 10/02/22  1925      History   Chief Complaint Chief Complaint  Patient presents with   Rash    HPI [name] is a 47 y.o. female.   Patient presents with itchy rash to torso that has been present for about a week.  Patient has not used any medications for symptoms.  Denies changes in environment that could have caused symptoms.  Reports that they get a rash this time of year every year that is similar. Denies fever. Typically uses betamethasone cream with resolution. Reports they have tried triamcinolone cream previously and it makes the rash worse.  Denies feelings of throat closing or shortness of breath.  Requesting refill on betamethasone cream.   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    Patient Active Problem List   Diagnosis Date Noted   Family history of breast cancer    Family history of pancreatic cancer    Family history of lung cancer    Family history of colon cancer    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   Pelvic mass in female 07/23/2019   Abnormal uterine bleeding 07/23/2019   Acute cholecystitis 12/04/2018   Essential hypertension 10/06/2015   Hyperglycemia 10/06/2015   Elevated LFTs 07/21/2014    Past Surgical History:  Procedure Laterality Date   CHOLECYSTECTOMY N/A 12/05/2018   Procedure: LAPAROSCOPIC CHOLECYSTECTOMY  WITH INTRAOPERATIVE CHOLANGIOGRAM;  Surgeon: Ovidio Kin, MD;  Location: [facility];  Service: General;  Laterality: N/A;   KNEE ARTHROSCOPY Right 1993   ROBOTIC ASSISTED SALPINGO OOPHERECTOMY N/A 08/05/2019   Procedure: XI ROBOTIC ASSISTED UNILATERAL   OOPHORECTOMY,BILATERAL SALPINGECTOMY;  Surgeon: Carver Fila, MD;  Location: [facility];  Service: Gynecology;  Laterality: N/A;    OB History     Gravida  2   Para  1   Term      Preterm      AB  1   Living  0      SAB      IAB      Ectopic      Multiple      Live Births           Obstetric Comments  Adopted 1 child          Home Medications    Prior to Admission medications   Medication Sig Start Date End Date Taking? Authorizing Provider  betamethasone dipropionate 0.05 % cream Apply topically daily. 10/02/22  Yes Johnrobert Foti, Acie Fredrickson, FNP  cetirizine (ZYRTEC) 10 MG tablet Take 1 tablet (10 mg total) by mouth daily. 10/02/22  Yes Samariya Rockhold, Rolly Salter E, FNP  famotidine (PEPCID) 20 MG tablet Take 1 tablet (20 mg total) by mouth  2 (two) times daily. 10/02/22  Yes Dewarren Ledbetter, Sherleen Pangborn E, FNP  BLACK ELDERBERRY PO Take 2 tablets by mouth 2 (two) times daily.    [provider]  ciclopirox (PENLAC) 8 % solution Apply topically at bedtime. Apply over nail and surrounding skin. Apply daily over previous coat. After seven (7) days, may remove with alcohol and continue cycle. 02/02/21   Candelaria Stagers, DPM  ciprofloxacin (CILOXAN) 0.3 % ophthalmic solution Administer 1 drop, every 2 hours, while awake, for 2 days. Then 1 drop, every 4 hours, while awake, for the next 5 days. 12/01/21   Theadora Rama Scales, PA-C  diclofenac (VOLTAREN) 75 MG EC tablet Take 1 tablet (75 mg total) by mouth 2 (two) times daily. 09/15/20   Tarry Kos, MD  losartan-hydrochlorothiazide (HYZAAR) 100-12.5 MG tablet Take 1 tablet by mouth daily. 07/14/20   Shade Flood, MD  metformin (FORTAMET) 500 MG (OSM) 24 hr tablet Take 2 tablets twice daily for  diabetes. 07/31/20   Peyton Najjar, MD    Family History Family History  Problem Relation Age of Onset   Hypertension Father    Breast cancer Paternal Grandmother 54   Lung cancer Paternal Grandfather 12   Esophageal cancer Paternal Grandfather    Pancreatic cancer Maternal Aunt 67   Colon cancer Paternal Aunt 54   Diabetes Maternal Grandmother    Breast cancer Maternal Aunt 55   Cancer Paternal Aunt        unk type   Diabetes Maternal Aunt    Ovarian cancer Neg Hx    Endometrial cancer Neg Hx    Colon polyps Neg Hx    Rectal cancer Neg Hx    Stomach cancer Neg Hx     Social History Social History   Tobacco Use   Smoking status: Former    Types: Cigarettes    Quit date: 07/29/2017    Years since quitting: 5.1   Smokeless tobacco: Never  Vaping Use   Vaping Use: Never used  Substance Use Topics   Alcohol use: Yes    Comment: 1 bottle of wine each night    Drug use: No     Allergies   Lisinopril   Review of Systems Review of Systems Per HPI  Physical Exam Triage Vital Signs ED Triage Vitals  Enc Vitals Group     BP 10/02/22 1935 103/71     Pulse Rate 10/02/22 1935 96     Resp 10/02/22 1935 20     Temp 10/02/22 1935 98.1 F (36.7 C)     Temp Source 10/02/22 1935 Oral     SpO2 10/02/22 1935 95 %     Weight --      Height --      Head Circumference --      Peak Flow --      Pain Score 10/02/22 1937 0     Pain Loc --      Pain Edu? --      Excl. in GC? --    No data found.  Updated Vital Signs BP 103/71 (BP Location: Right Arm)   Pulse 96   Temp 98.1 F (36.7 C) (Oral)   Resp 20   SpO2 95%   Visual Acuity Right Eye Distance:   Left Eye Distance:   Bilateral Distance:    Right Eye Near:   Left Eye Near:    Bilateral Near:     Physical Exam Constitutional:      General: She  is not in acute distress.    Appearance: Normal appearance. She is not toxic-appearing or diaphoretic.  HENT:     Head: Normocephalic and atraumatic.  Eyes:      Extraocular Movements: Extraocular movements intact.     Conjunctiva/sclera: Conjunctivae normal.  Pulmonary:     Effort: Pulmonary effort is normal.  Skin:    Comments: Scattered areas of erythema that is slightly raised and circular present throughout various areas of the abdomen and back.  No drainage noted from lesions.  Neurological:     General: No focal deficit present.     Mental Status: She is alert and oriented to person, place, and time. Mental status is at baseline.  Psychiatric:        Mood and Affect: Mood normal.        Behavior: Behavior normal.        Thought Content: Thought content normal.        Judgment: Judgment normal.      UC Treatments / Results  Labs (all labs ordered are listed, but only abnormal results are displayed) Labs Reviewed - No data to display  EKG   Radiology No results found.  Procedures Procedures (including critical care time)  Medications Ordered in UC Medications - No data to display  Initial Impression / Assessment and Plan / UC Course  I have reviewed the triage vital signs and the nursing notes.  Pertinent labs & imaging results that were available during my care of the patient were reviewed by me and considered in my medical decision making (see chart for details).     Suspect contact dermatitis.  Patient requesting refill  on betamethasone cream which I do think is reasonable.  Although advised patient to use this sparingly.  Will avoid oral and systemic steroids given patient's history of glaucoma.  Antihistamine and Pepcid prescribed to help alleviate symptoms as well given patient denies that she takes these daily so that should be safe.  Advised strict return precautions if symptoms persist or worsen.  Patient verbalized understanding and was agreeable with plan. Final Clinical Impressions(s) / UC Diagnoses   Final diagnoses:  Rash and nonspecific skin eruption     Discharge Instructions      I have refilled your  cream and prescribed cetirizine and Pepcid to alleviate rash.  Please follow-up if any symptoms persist or worsen.    ED Prescriptions     Medication Sig Dispense Auth. Provider   betamethasone dipropionate 0.05 % cream Apply topically daily. 30 g Ervin KnackMound, Wanell Lorenzi E, [place]   famotidine (PEPCID) 20 MG tablet Take 1 tablet (20 mg total) by mouth 2 (two) times daily. 30 tablet [place], LeavenworthHaley E, [place]NP   cetirizine (ZYRTEC) 10 MG tablet Take 1 tablet (10 mg total) by mouth daily. 30 tablet [place], Acie FredricksonHaley E, [place]FNP      PDMP not reviewed this encounter.   Gustavus BryantMound, Johntavius Shepard E, [place]NP 10/02/22 2009

## 2022-10-02 NOTE — ED Triage Notes (Signed)
Pt c/o rash onset ~ 1 week ago states it just started feeling itchy today. Reports it is on torso and back. Says it happens every year. Treats with betamethasone.

## 2023-03-26 ENCOUNTER — Ambulatory Visit: Payer: Federal, State, Local not specified - PPO | Admitting: Podiatry

## 2023-04-13 ENCOUNTER — Emergency Department (HOSPITAL_BASED_OUTPATIENT_CLINIC_OR_DEPARTMENT_OTHER): Payer: Federal, State, Local not specified - PPO

## 2023-04-13 ENCOUNTER — Emergency Department (HOSPITAL_BASED_OUTPATIENT_CLINIC_OR_DEPARTMENT_OTHER)
Admission: EM | Admit: 2023-04-13 | Discharge: 2023-04-13 | Disposition: A | Discharge status: Home / Self Care | Payer: Federal, State, Local not specified - PPO | Attending: Emergency Medicine | Admitting: Emergency Medicine

## 2023-04-13 ENCOUNTER — Encounter (HOSPITAL_BASED_OUTPATIENT_CLINIC_OR_DEPARTMENT_OTHER): Payer: Self-pay | Admitting: Emergency Medicine

## 2023-04-13 DIAGNOSIS — M25572 Pain in left ankle and joints of left foot: Secondary | ICD-10-CM | POA: Diagnosis present

## 2023-04-13 DIAGNOSIS — M109 Gout, unspecified: Secondary | ICD-10-CM | POA: Diagnosis not present

## 2023-04-13 MED ORDER — PREDNISONE 10 MG (21) PO TBPK: ORAL_TABLET | Freq: Every day | ORAL | 0 refills | Status: DC

## 2023-04-13 MED ORDER — METHYLPREDNISOLONE SODIUM SUCC 125 MG IJ SOLR
125.0000 mg | Freq: Once | INTRAMUSCULAR | Status: AC
  Administered 2023-04-13: 125 mg via INTRAMUSCULAR
  Filled 2023-04-13: qty 2

## 2023-04-13 MED ORDER — COLCHICINE 0.6 MG PO TABS
1.2000 mg | ORAL_TABLET | Freq: Once | ORAL | Status: AC
  Administered 2023-04-13: 1.2 mg via ORAL
  Filled 2023-04-13: qty 2

## 2023-04-13 MED ORDER — DIPHENHYDRAMINE HCL 25 MG PO CAPS
25.0000 mg | ORAL_CAPSULE | Freq: Once | ORAL | Status: DC
  Filled 2023-04-13: qty 1

## 2023-04-13 MED ORDER — COLCHICINE 0.6 MG PO TABS
0.6000 mg | ORAL_TABLET | Freq: Every day | ORAL | 0 refills | Status: DC
Start: 2023-04-13 — End: 2023-10-21

## 2023-04-13 NOTE — ED Notes (Signed)
Pt experiencing some new onset itching. Pt reports not severe, airway and respiratory intact and WDL. PA notified, received orders for benadryl. Pt provided education on crutch use.

## 2023-04-13 NOTE — Discharge Instructions (Addendum)
As discussed I have sent prednisone, and colchicine into the pharmacy.  Take colchicine 1 hour after the first dose that you are given in the emergency department.  You can start the steroids tomorrow.  Use crutches to offload the left ankle until you feel you can tolerate bearing weight.  If you have any worsening or concerning symptoms or if you develop a fever without another source return for evaluation as this could be concerning for septic joint that we discussed was concerning for bacterial infection.  This is a surgical emergency.  We discussed doing a fluid analysis however you state you would like to trial treatment and then come back if you have any concerning symptoms.  Otherwise I recommend you follow-up with your primary care provider.

## 2023-04-13 NOTE — ED Provider Notes (Signed)
[place] EMERGENCY DEPARTMENT AT [facility] Provider Note   CSN: [id number] Arrival date & time: 04/13/23  4098     History  Chief Complaint  Patient presents with   Ankle Pain    Doris Armstrong is a 47 y.o. female.  47 year old female presents today for concern of left ankle pain.  Started yesterday.  She states this morning she was unable to bear weight.  She states even a touch of her bedsheet was significantly painful.  She denies Flareup in the past.  She does take hydrochlorothiazide.  Denies any recent injury to her ankle with the exception of twisting her ankle about 1 month ago.  She states initially healed well until the pain started yesterday.  Denies other complaints.  Endorses joint swelling as well.  Pain predominantly located on the medial aspect of the left ankle.  The history is provided by the patient. No language interpreter was used.       Home Medications Prior to Admission medications   Medication Sig Start Date End Date Taking? Authorizing Provider  betamethasone dipropionate 0.05 % cream Apply topically daily. 10/02/22   [place], Acie Fredrickson, FNP  BLACK ELDERBERRY PO Take 2 tablets by mouth 2 (two) times daily.    [provider]  cetirizine (ZYRTEC) 10 MG tablet Take 1 tablet (10 mg total) by mouth daily. 10/02/22   Gustavus Bryant, FNP  ciclopirox (PENLAC) 8 % solution Apply topically at bedtime. Apply over nail and surrounding skin. Apply daily over previous coat. After seven (7) days, may remove with alcohol and continue cycle. 02/02/21   Candelaria Stagers, DPM  ciprofloxacin (CILOXAN) 0.3 % ophthalmic solution Administer 1 drop, every 2 hours, while awake, for 2 days. Then 1 drop, every 4 hours, while awake, for the next 5 days. 12/01/21   Theadora Rama Scales, PA-C  diclofenac (VOLTAREN) 75 MG EC tablet Take 1 tablet (75 mg total) by mouth 2 (two) times daily. 09/15/20   Tarry Kos, MD  famotidine (PEPCID) 20 MG tablet Take 1 tablet (20 mg  total) by mouth 2 (two) times daily. 10/02/22   Gustavus Bryant, FNP  losartan-hydrochlorothiazide (HYZAAR) 100-12.5 MG tablet Take 1 tablet by mouth daily. 07/14/20   Shade Flood, MD  metformin (FORTAMET) 500 MG (OSM) 24 hr tablet Take 2 tablets twice daily for diabetes. 07/31/20   Peyton Najjar, MD      Allergies    Lisinopril    Review of Systems   Review of Systems  Constitutional:  Negative for chills and fever.  Musculoskeletal:  Positive for arthralgias and joint swelling.  All other systems reviewed and are negative.   Physical Exam Updated Vital Signs BP 117/84 (BP Location: Right Arm)   Pulse 78   Temp 98.5 F (36.9 C) (Oral)   Resp 18   Ht 6' (1.829 m)   Wt 127 kg   LMP 03/14/2023   SpO2 96%   BMI 37.97 kg/m  Physical Exam Vitals and nursing note reviewed.  Constitutional:      General: She is not in acute distress.    Appearance: Normal appearance. She is not ill-appearing.  HENT:     Head: Normocephalic and atraumatic.     Nose: Nose normal.  Eyes:     Conjunctiva/sclera: Conjunctivae normal.  Cardiovascular:     Rate and Rhythm: Normal rate.  Pulmonary:     Effort: Pulmonary effort is normal. No respiratory distress.  Musculoskeletal:  General: No deformity. Normal range of motion.     Cervical back: Normal range of motion.     Comments: Exquisitely tender to palpation on medial aspect of left ankle.  Range of motion limited secondary to pain.  Neurovascularly intact.  No erythema.  Left ankle without tenderness palpation.  Brisk cap refill.  Skin:    Findings: No rash.  Neurological:     Mental Status: She is alert.     ED Results / Procedures / Treatments   Labs (all labs ordered are listed, but only abnormal results are displayed) Labs Reviewed - No data to display  EKG None  Radiology DG Ankle Complete Left  Result Date: 04/13/2023 CLINICAL DATA:  Left ankle pain.  Swelling. EXAM: LEFT ANKLE COMPLETE - 3+ VIEW COMPARISON:   None Available. FINDINGS: Bone mineralization within normal limits for patient's age. No acute fracture or dislocation. No aggressive osseous lesion. Ankle mortise appears intact. Calcaneal spur noted along the Achilles tendon and Plantar aponeurosis attachment sites. Mild-to-moderate diffuse degenerative changes of imaged joints. No focal soft tissue swelling. No radiopaque foreign bodies. 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 Electronically Signed   By: Jules Schick M.D.   On: 04/13/2023 09:31    Procedures Procedures    Medications Ordered in ED Medications  methylPREDNISolone sodium succinate (SOLU-MEDROL) 125 mg/2 mL injection 125 mg (has no administration in time range)  colchicine tablet 1.2 mg (has no administration in time range)    ED Course/ Medical Decision Making/ A&P                                 Medical Decision Making Amount and/or Complexity of Data Reviewed Radiology: ordered.  Risk Prescription drug management.   47 year old female presents today for concern of left ankle pain and swelling.  Ongoing since yesterday.  Unable to bear weight.  Atraumatic.  Has had recent injury but states her injury was well-healed when this started yesterday.  Consideration given to staff today for pruritus however patient is afebrile and does have risk factors for gout.  I did offer arthrocentesis however patient defers and will return if she has any worsening symptoms.  She will otherwise follow-up with her PCP this week.  Discussed that septic arthritis is a surgical emergency and can have a similar presentation as gout.  Discussed that we cannot definitively diagnose gout without doing an arthrocentesis.  She would prefer empiric treatment at this time.  Will give Solu-Medrol, and colchicine with strict return precautions.  She is agreeable with this.  Prednisone Dosepak as well as 1 dose of additional colchicine sent to patient's pharmacy.  Ankle x-ray  obtained without any acute finding.  Does have mild to moderate degenerative changes, no joint swelling noted.   Final Clinical Impression(s) / ED Diagnoses Final diagnoses:  Acute left ankle pain  Acute gout of left ankle, unspecified cause    Rx / DC Orders ED Discharge Orders          Ordered    colchicine 0.6 MG tablet  Daily        04/13/23 0959    predniSONE (STERAPRED UNI-PAK 21 TAB) 10 MG (21) TBPK tablet  Daily        04/13/23 0959              Marita Kansas, PA-C 04/13/23 1002    Alvira Monday, MD 04/14/23 2235

## 2023-04-13 NOTE — ED Triage Notes (Signed)
L ankle started hurting yesterday. States she woke up and couldn't put weight on it. Unknown injury.

## 2023-04-16 ENCOUNTER — Ambulatory Visit: Payer: Federal, State, Local not specified - PPO | Admitting: Podiatry

## 2023-04-16 ENCOUNTER — Encounter: Payer: Self-pay | Admitting: Podiatry

## 2023-04-16 DIAGNOSIS — M79675 Pain in left toe(s): Secondary | ICD-10-CM

## 2023-04-16 DIAGNOSIS — M2142 Flat foot [pes planus] (acquired), left foot: Secondary | ICD-10-CM

## 2023-04-16 DIAGNOSIS — B351 Tinea unguium: Secondary | ICD-10-CM

## 2023-04-16 DIAGNOSIS — M2141 Flat foot [pes planus] (acquired), right foot: Secondary | ICD-10-CM | POA: Diagnosis not present

## 2023-04-16 DIAGNOSIS — M76822 Posterior tibial tendinitis, left leg: Secondary | ICD-10-CM | POA: Diagnosis not present

## 2023-04-16 DIAGNOSIS — M79674 Pain in right toe(s): Secondary | ICD-10-CM | POA: Diagnosis not present

## 2023-04-16 DIAGNOSIS — E119 Type 2 diabetes mellitus without complications: Secondary | ICD-10-CM

## 2023-04-16 MED ORDER — CICLOPIROX 8 % EX SOLN: Freq: Every day | CUTANEOUS | 0 refills | Status: AC

## 2023-04-16 MED ORDER — MELOXICAM 15 MG PO TABS: 15.0000 mg | ORAL_TABLET | Freq: Every day | ORAL | 1 refills | Status: DC

## 2023-04-16 MED ORDER — BETAMETHASONE SOD PHOS & ACET 6 (3-3) MG/ML IJ SUSP
3.0000 mg | Freq: Once | INTRAMUSCULAR | Status: AC
Start: 2023-04-16 — End: 2023-04-16
  Administered 2023-04-16: 3 mg via INTRA_ARTICULAR

## 2023-04-16 NOTE — Progress Notes (Signed)
Chief Complaint  Patient presents with   Ankle Pain    Ankle left - patient went to ED on 04/13/23 with severe ankle pain, woke up that morning and couldn't bear weight, xrayed, thought gout, Rx'd meds - much better now   Nail Problem    Toenails bilateral - diabetic - last A1c 9.8, concerned about ingrown toenails, tender at times   New Patient (Initial Visit)    SUBJECTIVE Patient with a history of diabetes mellitus presents to office today for evaluation of pain and tenderness associated to the medial aspect of the left foot and ankle.  Patient was on her feet throughout the entire weekend at [facility] [employer] homecoming and the following day noticed significant pain and tenderness associated to the left foot.  She went to the emergency department diagnosed with possible gout.  She was prescribed colchicine which she has completed as well as a prednisone taper.  Patient also has a long history of chronic foot pain bilateral.  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  Patient also complaining of elongated, thickened nails that cause pain while ambulating in shoes.  Patient is unable to trim their own nails. Patient is here for further evaluation and treatment.  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 Derm Skin is dry and supple bilateral. Negative open lesions or macerations. Remaining integument unremarkable. Nails are tender, long, thickened and dystrophic with subungual debris, consistent with onychomycosis, 1-5 bilateral. No signs of infection noted. Vasc  DP and PT pedal pulses palpable bilaterally. Temperature gradient within normal limits.  Neuro light touch and protective threshold sensation diminished bilaterally.  Musculoskeletal Exam pes planus noted with collapse of medial longitudinal arch of the foot bilateral.  There is also tenderness to palpation along the posterior tibial tendon left just distal to the medial malleolus  ASSESSMENT 1. Diabetes Mellitus w/ peripheral neuropathy 2.  Pain due to onychomycosis of toenails bilateral  PLAN OF CARE -Patient evaluated.  Comprehensive diabetic foot exam performed today -Injection 0.5 cc Celestone Soluspan injected along the posterior tibial tendon sheath left -Prescription for meloxicam 15 mg daily after completion of the Dosepak -Mechanical debridement of nails 1-5 bilateral was performed using a nail nipper without incident or bleeding.  Smoothed with a rotary bur -Prescription for Penlac 8% topical to apply daily to the toenails.  Patient currently not a candidate for oral Lamisil due to elevated liver enzymes -Appointment with Pedorthist for custom molded orthotics.  This should help support the medial longitudinal arch of the foot bilateral and alleviate chronic foot pain -Return to clinic in 4 weeks.  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 Felecia Shelling, DPM    [address] [place], [place] [address]                Office [phone]  Fax [phone]

## 2023-05-14 ENCOUNTER — Ambulatory Visit: Payer: Federal, State, Local not specified - PPO

## 2023-05-14 ENCOUNTER — Encounter: Payer: Self-pay | Admitting: Podiatry

## 2023-05-14 ENCOUNTER — Ambulatory Visit: Payer: Federal, State, Local not specified - PPO | Admitting: Podiatry

## 2023-05-14 ENCOUNTER — Ambulatory Visit: Payer: Federal, State, Local not specified - PPO | Admitting: Orthopaedic Surgery

## 2023-05-14 VITALS — Ht 72.0 in | Wt 280.0 lb

## 2023-05-14 DIAGNOSIS — M76822 Posterior tibial tendinitis, left leg: Secondary | ICD-10-CM

## 2023-05-14 NOTE — Progress Notes (Signed)
Orthotic eval   Patient was seen, measured / scanned for custom molded foot orthotics. Pes planus deformity present with over pronation PTT tendonitis with pain present as well  Patient will benefit from CFO's as they will help provide total contact to MLA's helping to better distribute body weight across BIL feet greater reducing plantar pressure and pain and to also encourage FF and RF alignment.  Patient was scanned items to be ordered and fit when in  HOLD FOR PT TO VERIFY INS  Addison Bailey Cped, CFo, CFm

## 2023-05-14 NOTE — Progress Notes (Signed)
   No chief complaint on file.   SUBJECTIVE Patient with a history of diabetes mellitus presents to office today for follow-up evaluation of posterior tibial tendinitis left just posterior to the medial malleolus.   Brief history: Patient was on her feet throughout the entire weekend at [facility] [employer] homecoming and the following day noticed significant pain and tenderness associated to the left foot.  She went to the emergency department diagnosed with possible gout.  She was prescribed colchicine which she completed as well as a prednisone taper.  Patient also has a long history of chronic foot pain bilateral.  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  Patient states that her toenails feel significantly better after debridement.  She has no pain or tenderness.  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 Derm Skin is dry and supple bilateral. Negative open lesions or macerations. Remaining integument unremarkable.  Vasc  DP and PT pedal pulses palpable bilaterally. Temperature gradient within normal limits.  Neuro light touch and protective threshold sensation diminished bilaterally.  Musculoskeletal Exam pes planus noted with collapse of medial longitudinal arch of the foot bilateral.   Improved tenderness to palpation along the posterior tibial tendon left just distal to the medial malleolus  ASSESSMENT 1. Diabetes Mellitus w/ peripheral neuropathy 2.  Posterior tibial tendinitis left  PLAN OF CARE -Patient evaluated.   -Prescription for meloxicam 15 mg daily.  Patient states that she did not get the prescription last visit.  She will pick it up to take as needed - Continue Penlac 8% topical to apply daily to the toenails.  Patient currently not a candidate for oral Lamisil due to elevated liver enzymes - Patient has an appointment today with Pedorthist for custom molded orthotics fitting.  Return to clinic for orthotics pickup. 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 Felecia Shelling, DPM    [address] [place], [place] [address]                Office [phone]  Fax [phone]

## 2023-05-18 IMAGING — DX DG TOE 4TH 2+V*L*
3 series · 3 of 3 positions shown · non-contrast
Comparison: None.

CLINICAL DATA: 44-year-old female with trauma to the left fourth
toe.

EXAM:
LEFT FOURTH TOE

[toe ap]
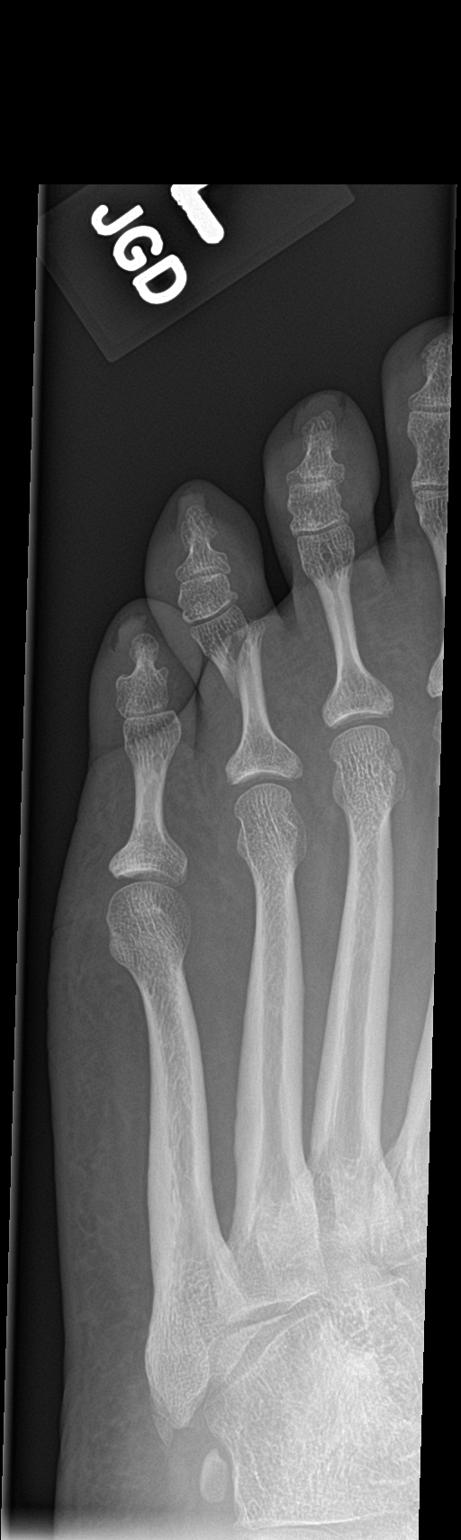

[toe obl]
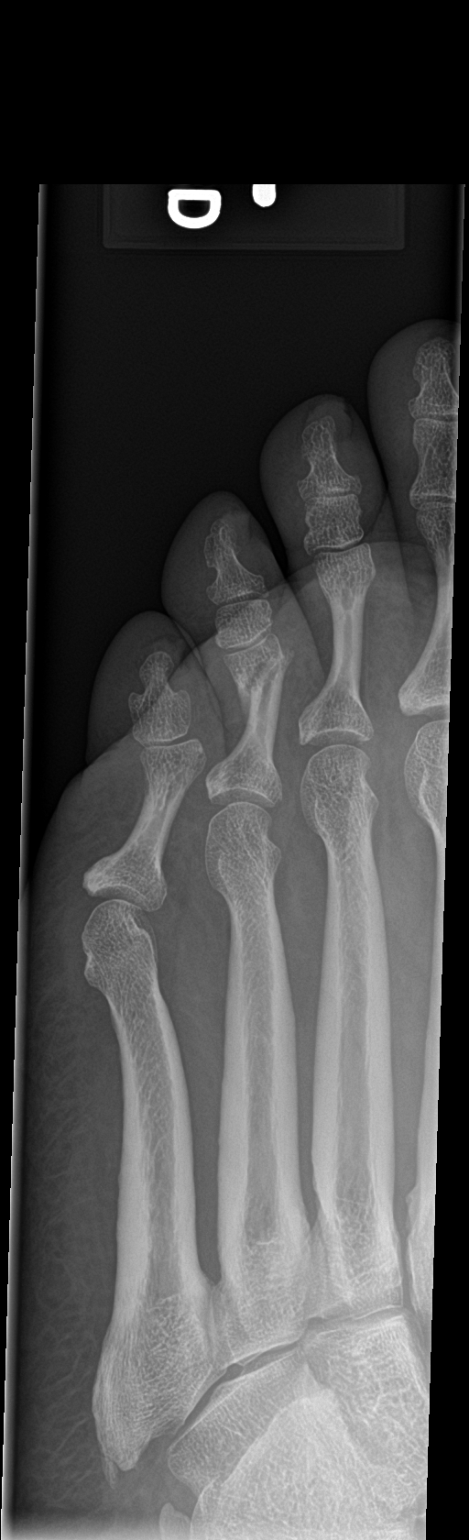

[toe lat]
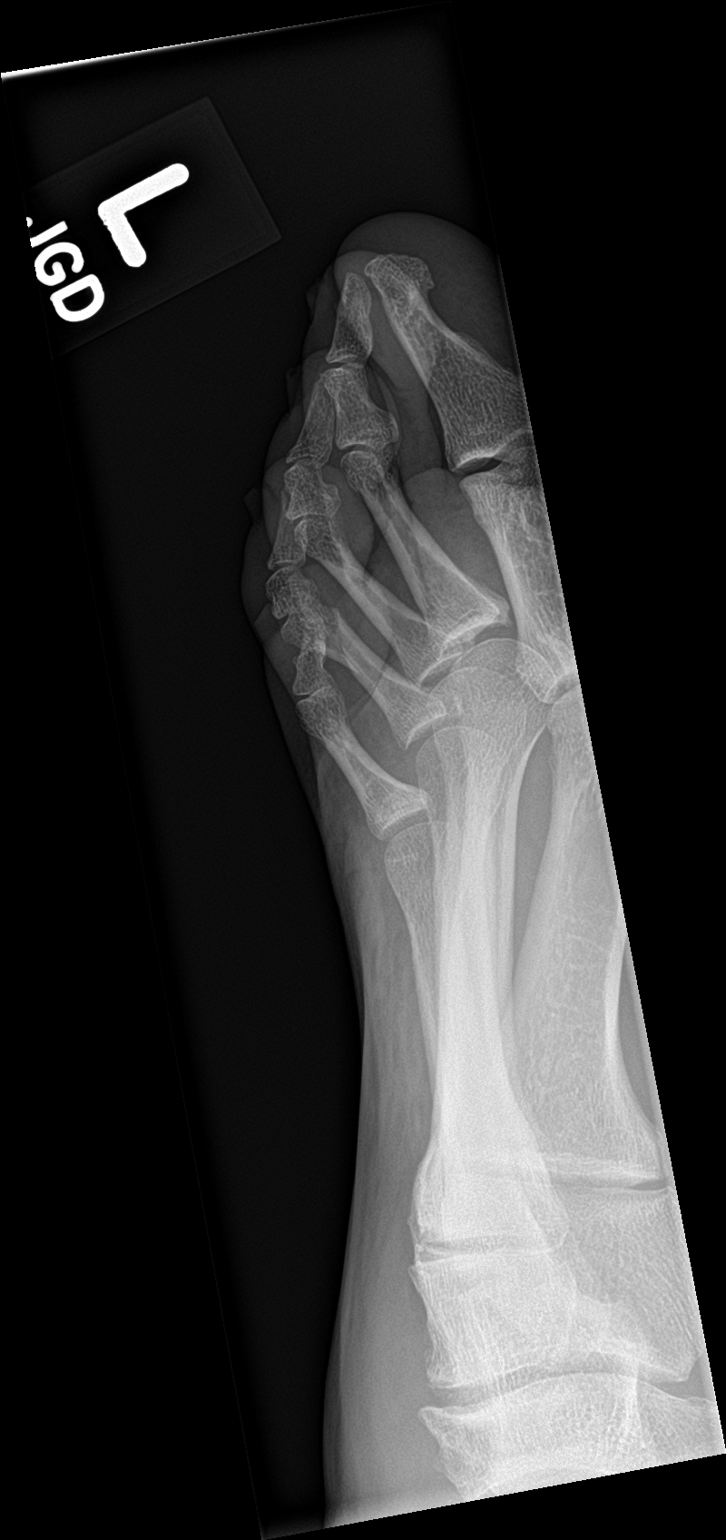

[3 of 3 positions shown; findings below may reference images not displayed]

FINDINGS: Mildly displaced oblique fracture of the proximal phalanx of the
fourth toe with mild lateral subluxation of the distal fracture
fragment. No dislocation. No arthritic changes. The soft tissue
swelling of the fourth toe. No radiopaque foreign object or soft
tissue gas.
IMPRESSION: Mildly displaced oblique fracture of the proximal phalanx of the
fourth toe.

## 2023-06-11 ENCOUNTER — Telehealth: Payer: Self-pay

## 2023-06-11 NOTE — Telephone Encounter (Signed)
Called PT to schedule orthotic pick up, VM was full.

## 2023-08-06 ENCOUNTER — Telehealth: Payer: Self-pay

## 2023-08-06 NOTE — Telephone Encounter (Signed)
Left VM so she can schedule orthotic pick up. 2nd attempt

## 2023-08-22 ENCOUNTER — Other Ambulatory Visit: Payer: Self-pay | Admitting: Podiatry

## 2023-10-20 ENCOUNTER — Telehealth: Payer: Self-pay | Admitting: *Deleted

## 2023-10-20 NOTE — Telephone Encounter (Addendum)
 Attempted to reach the patient to schedule a new patient appt with Dr Orvil Bland. Unable to leave message due to voicemail box is full. Hold a spot on 5/9

## 2023-10-21 ENCOUNTER — Encounter: Payer: Self-pay | Admitting: Gynecologic Oncology

## 2023-10-21 ENCOUNTER — Telehealth: Payer: Self-pay

## 2023-10-21 NOTE — Telephone Encounter (Signed)
 Spoke with Doris Armstrong regarding her referral to GYN oncology. She has an appointment scheduled with Dr. Orvil Bland on 10/22/23 at 3:00. Patient agrees to date and time. She has been provided with office address and location. She is also aware of our mask and visitor policy. Patient verbalized understanding and will call with any questions.

## 2023-10-22 ENCOUNTER — Inpatient Hospital Stay

## 2023-10-22 ENCOUNTER — Encounter: Payer: Self-pay | Admitting: Gynecologic Oncology

## 2023-10-22 ENCOUNTER — Inpatient Hospital Stay: Attending: Gynecologic Oncology | Admitting: Gynecologic Oncology

## 2023-10-22 VITALS — BP 110/74 | HR 78 | Temp 98.0°F | Resp 18 | Ht 72.0 in | Wt 290.0 lb

## 2023-10-22 DIAGNOSIS — Z90721 Acquired absence of ovaries, unilateral: Secondary | ICD-10-CM | POA: Diagnosis not present

## 2023-10-22 DIAGNOSIS — R1900 Intra-abdominal and pelvic swelling, mass and lump, unspecified site: Secondary | ICD-10-CM | POA: Insufficient documentation

## 2023-10-22 DIAGNOSIS — N838 Other noninflammatory disorders of ovary, fallopian tube and broad ligament: Secondary | ICD-10-CM | POA: Diagnosis not present

## 2023-10-22 DIAGNOSIS — D3912 Neoplasm of uncertain behavior of left ovary: Secondary | ICD-10-CM | POA: Diagnosis present

## 2023-10-22 DIAGNOSIS — Z800 Family history of malignant neoplasm of digestive organs: Secondary | ICD-10-CM | POA: Insufficient documentation

## 2023-10-22 DIAGNOSIS — Z803 Family history of malignant neoplasm of breast: Secondary | ICD-10-CM | POA: Diagnosis not present

## 2023-10-22 DIAGNOSIS — Z86018 Personal history of other benign neoplasm: Secondary | ICD-10-CM | POA: Insufficient documentation

## 2023-10-22 NOTE — Progress Notes (Signed)
 GYNECOLOGIC ONCOLOGY NEW PATIENT CONSULTATION   Patient Name: Doris Armstrong  Patient Age: 48 y.o. Date of Service: 10/22/23 Referring Provider: Terri Fester, MD [address] [place],  [place] [address]   Primary Care Provider: Arloa Lamas, MD (Inactive) Consulting Provider: Wiley Hanger, MD   Assessment/Plan:  Premenopausal patient with complex adnexal mass in the setting of a history of steroid cell tumor of the ovary.  Discussed recent findings from ultrasound at her OB/GYN with the patient.  He, the patient is completely asymptomatic.  We discussed that I am unable to see the pictures but by the report, the the mass is comprised of a solid component as well as a complex cystic component.  Given her history of steroid cell tumor of the ovary, this certainly raises the concern for possible recurrence versus some other borderline or malignant process.  We reviewed option to move forward with surgery versus getting additional imaging if her preference would be to hold off on surgery.  While she is open to surgery, her preference would be to avoid surgery if this is safe.  I suggested that we get a pelvic MRI to better characterize the mass.  She understands that if the mass has concerning features on MRI, that I will recommend proceeding with diagnostic surgery.  We discussed options if we move forward with surgery including removal of her remaining ovary with or without concurrent total hysterectomy.  Given age of 67, we discussed decision about hormone replacement therapy until closer to the average age of menopause.  In someone who still has a uterus, this would mean estrogen replacement with the addition of progesterone for endometrial protection.  If concurrent total hysterectomy was performed, the patient could take estrogen only.  We reviewed the differences in surgery, risk, and different options for HRT.  Will discuss this further at our phone visit after she gets her MRI and if the  plan is to move forward with diagnostic surgery.  The patient appears to have undergone genetic testing back in 2021 but I am having trouble finding these results.  I will reach out to the genetic counselor who spoke with her at that time.  A copy of this note was sent to the patient's referring provider.   55 minutes of total time was spent for this patient encounter, including preparation, face-to-face counseling with the patient and coordination of care, and documentation of the encounter.  Wiley Hanger, MD  Division of Gynecologic Oncology  Department of Obstetrics and Gynecology  [facility]  ___________________________________________  Chief Complaint: No chief complaint on file.   History of Present Illness:  Doris Armstrong is a 48 y.o. y.o. female who is seen in consultation at the request of Terri Fester, MD for an evaluation of a complex pelvic mass.  I saw the patient initially in January 2021 after she presented with a complex leg mass in the setting of a long history of abnormal uterine bleeding and PCOS.  She ultimately underwent surgery with me as noted below:  08/05/19: Robotic assisted bilateral salpingectomy and left oophorectomy, right uterosacral ligament implant biopsy. 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  Biopsy with stromal calcifications, negative for malignancy. Cytology negative. Hormone testing on 08/11/19:  FSH 11.1 LH 6.1 Testosterone  13 Cortisol 7.6  CT surveillance imaging in 2/022 showed similar appearing left para-aortic lymph node measuring 1.1 cm. NO new or progressive lymph node enlargement in the A/P.  New 6.7 x 5.4 right adnexal cyst, nonspecific. Mild diffuse hepatic steatosis.  Pelvic ultrasound in 08/2020 to follow-up new pelvic cyst on CT showed right ovary measured 4.6 cm with a simple 5.4 x 3.1 x 4.4 cm with no mural nodularity or internal  septations, no ascites.   The patient was then lost to follow-up until this year. She saw Dr. Ricky Charter in April. Pelvic ultrasound at [facility] on 4/23 showed right ovary measured up to 6.3 cm with a mixed echogenicity solid component along with several complex appearing with increased blood flow.   Patient presents today stating she is overall doing well.  She denies any abdominal or pelvic pain.  Reports good appetite with any recent weight changes.  She endorses normal bowel bladder function.  She endorses regular menses without any intermenstrual bleeding.  Most recent menstrual cycle started today.  PAST MEDICAL HISTORY:  Past Medical History:  Diagnosis Date   Adnexal mass    Allergy    Diabetes mellitus    type 2   Diabetes mellitus without complication (HCC)    Family history of breast cancer    Family history of colon cancer    Family history of lung cancer    Family history of pancreatic cancer    Gall bladder stones    GERD (gastroesophageal reflux disease)    History of infection due to human papilloma virus (HPV) 2019   per patient , now resolved    Hypertension    Obesity    PCOS (polycystic ovarian syndrome)    Personal history of cervical dysplasia    Sex cord tumor of ovary, 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     PAST SURGICAL HISTORY:  Past Surgical History:  Procedure Laterality Date   CHOLECYSTECTOMY N/A 12/05/2018   Procedure: LAPAROSCOPIC CHOLECYSTECTOMY WITH INTRAOPERATIVE CHOLANGIOGRAM;  Surgeon: Juanita Norlander, MD;  Location: [facility];  Service: General;  Laterality: N/A;   KNEE ARTHROSCOPY Right 1993   ROBOTIC ASSISTED SALPINGO OOPHERECTOMY N/A 08/05/2019   Procedure: XI ROBOTIC ASSISTED UNILATERAL   OOPHORECTOMY,BILATERAL SALPINGECTOMY;  Surgeon: Suzi Essex, MD;  Location: [facility];  Service: Gynecology;  Laterality: N/A;    OB/GYN HISTORY:  OB History  Gravida Para Term Preterm AB Living  2 1   1  0  SAB IAB Ectopic Multiple Live Births          # Outcome Date  GA Lbr Len/2nd Weight Sex Type Anes PTL Lv  2 AB           1 Para             Obstetric Comments  Adopted 1 child    No LMP recorded.  Age at menarche: 50  Hx of STDs: HPV Last pap: 09/2023 - NIML, HR HPV negative; history of CIN1 and HPV in 2019 History of abnormal pap smears: yes  SCREENING STUDIES:  Last mammogram: 2025  Last colonoscopy: 2021  MEDICATIONS: Outpatient Encounter Medications as of 10/22/2023  Medication Sig   amLODipine (NORVASC) 5 MG tablet Take 5 mg by mouth daily.   betamethasone  dipropionate 0.05 % cream Apply topically daily.   cetirizine  (ZYRTEC ) 10 MG tablet Take 1 tablet (10 mg total) by mouth daily.   ciclopirox  (PENLAC ) 8 % solution Apply topically at bedtime. Apply over nail and surrounding skin. Apply daily over previous coat. After seven (7) days, may remove with alcohol and continue cycle.   ciprofloxacin  (CILOXAN ) 0.3 % ophthalmic solution Administer 1 drop, every 2 hours, while awake, for 2 days.  Then 1 drop, every 4 hours, while awake, for the next 5 days.   latanoprost (XALATAN) 0.005 % ophthalmic solution SMARTSIG:1 Drop(s) In Eye(s) Every Evening   losartan -hydrochlorothiazide  (HYZAAR) 100-12.5 MG tablet Take 1 tablet by mouth daily.   ondansetron  (ZOFRAN ) 4 MG tablet TAKE 1 TABLET BY MOUTH EVERY 8 HOURS AS NEEDED FOR NAUSEA FOR UP TO 7 DAYS   OZEMPIC, 0.25 OR 0.5 MG/DOSE, 2 MG/3ML SOPN SMARTSIG:0.25 Milligram(s) SUB-Q Once a Week   [DISCONTINUED] BLACK ELDERBERRY PO Take 2 tablets by mouth 2 (two) times daily.   [DISCONTINUED] colchicine  0.6 MG tablet Take 1 tablet (0.6 mg total) by mouth daily.   [DISCONTINUED] diclofenac  (VOLTAREN ) 75 MG EC tablet Take 1 tablet (75 mg total) by mouth 2 (two) times daily.   [DISCONTINUED] famotidine  (PEPCID ) 20 MG tablet Take 1 tablet (20 mg total) by mouth 2 (two) times daily.   [DISCONTINUED] meloxicam  (MOBIC ) 15 MG tablet TAKE 1 TABLET (15 MG TOTAL) BY MOUTH DAILY.   [DISCONTINUED] metformin  (FORTAMET ) 500  MG (OSM) 24 hr tablet Take 2 tablets twice daily for diabetes.   [DISCONTINUED] predniSONE  (STERAPRED UNI-PAK 21 TAB) 10 MG (21) TBPK tablet Take by mouth daily. Take 6 tabs by mouth daily  for 2 days, then 5 tabs for 2 days, then 4 tabs for 2 days, then 3 tabs for 2 days, 2 tabs for 2 days, then 1 tab by mouth daily for 2 days   [DISCONTINUED] rosuvastatin (CRESTOR) 10 MG tablet Take 10 mg by mouth daily.   No facility-administered encounter medications on file as of 10/22/2023.    ALLERGIES:  Allergies  Allergen Reactions   Lisinopril Swelling     FAMILY HISTORY:  Family History  Problem Relation Age of Onset   Hypertension Father    Breast cancer Paternal Grandmother 23   Lung cancer Paternal Grandfather 54   Esophageal cancer Paternal Grandfather    Pancreatic cancer Maternal Aunt 67   Colon cancer Paternal Aunt 38   Diabetes Maternal Grandmother    Breast cancer Maternal Aunt 3   Cancer Paternal Aunt        unk type   Diabetes Maternal Aunt    Ovarian cancer Neg Hx    Endometrial cancer Neg Hx    Colon polyps Neg Hx    Rectal cancer Neg Hx    Stomach cancer Neg Hx      SOCIAL HISTORY:  Social Connections: Somewhat Isolated (07/09/2022)   Received from [facility]   Social Network    How would you rate your social network (family, work, friends)?: Restricted participation with some degree of social isolation    REVIEW OF SYSTEMS:  Denies appetite changes, fevers, chills, fatigue, unexplained weight changes. Denies hearing loss, neck lumps or masses, mouth sores, ringing in ears or voice changes. Denies cough or wheezing.  Denies shortness of breath. Denies chest pain or palpitations. Denies leg swelling. Denies abdominal distention, pain, blood in stools, constipation, diarrhea, nausea, vomiting, or early satiety. Denies pain with intercourse, dysuria, frequency, hematuria or incontinence. Denies hot flashes, pelvic pain, vaginal bleeding or vaginal  discharge.   Denies joint pain, back pain or muscle pain/cramps. Denies itching, rash, or wounds. Denies dizziness, headaches, numbness or seizures. Denies swollen lymph nodes or glands, denies easy bruising or bleeding. Denies anxiety, depression, confusion, or decreased concentration.  Physical Exam:  Vital Signs for this encounter:  Blood pressure 110/74, pulse 78, temperature 98 F (36.7 C), temperature source Oral, resp. rate 18,  height 6' (1.829 m), weight 290 lb (131.5 kg), SpO2 98%. Body mass index is 39.33 kg/m. General: Alert, oriented, no acute distress.  HEENT: Normocephalic, atraumatic. Sclera anicteric.  Chest: Clear to auscultation bilaterally. No wheezes, rhonchi, or rales. Cardiovascular: Regular rate and rhythm, no murmurs, rubs, or gallops.  Abdomen: Obese. Normoactive bowel sounds. Soft, nondistended, nontender to palpation. No masses or hepatosplenomegaly appreciated. No palpable fluid wave. Well healed laparoscopic incisions. Extremities: Grossly normal range of motion. Warm, well perfused. No edema bilaterally.  Skin: No rashes or lesions.  Lymphatics: No cervical, supraclavicular, or inguinal adenopathy.  GU:      Normal external female genitalia.  No lesions. No discharge or bleeding.             Bladder/urethra:  No lesions or masses, well supported bladder             Vagina: Well rugated vaginal mucosa.  No lesions.             Cervix: Speculum exam deferred.  On bimanual exam, cervix is soft without masses or nodularity.             Uterus: Small, mobile, retroverted, no parametrial involvement or nodularity.             Adnexa: Approximately 4-6 cm mobile and smooth mass appreciated in the cul-de-sac to the right.  No nodularity.             Rectal: Deferred.  LABORATORY AND RADIOLOGIC DATA:  Outside medical records were reviewed to synthesize the above history, along with the history and physical obtained during the visit.   Lab Results  Component Value  Date   WBC 6.6 07/30/2019   HGB 14.1 07/30/2019   HCT 43.2 07/30/2019   PLT 228 07/30/2019   GLUCOSE 429 (H) 07/28/2020   CHOL 144 01/07/2020   TRIG 55 01/07/2020   HDL 60 01/07/2020   LDLCALC 72 01/07/2020   ALT 171 (H) 07/28/2020   AST 93 (H) 07/28/2020   NA 134 07/28/2020   K 4.6 07/28/2020   CL 96 07/28/2020   CREATININE 0.94 07/28/2020   BUN 15 07/28/2020   CO2 26 07/28/2020   TSH 2.740 11/30/2018   HGBA1C 9.8 (H) 07/28/2020

## 2023-10-22 NOTE — Patient Instructions (Signed)
 It was nice to see you again today.  I will speak with you when I get your MRI results as well as your blood test from today.  If MRI looks concerning, we will get you set up for a surgery.  Please be thinking about whether you would want just the remaining ovary removed or also the uterus and cervix.

## 2023-10-23 ENCOUNTER — Encounter: Payer: Self-pay | Admitting: Gynecologic Oncology

## 2023-10-23 ENCOUNTER — Encounter: Payer: Self-pay | Admitting: Licensed Clinical Social Worker

## 2023-10-23 LAB — ESTRADIOL: Estradiol: 41.8 pg/mL

## 2023-10-23 LAB — DHEA-SULFATE: DHEA-SO4: 59.9 ug/dL (ref 41.2–243.7)

## 2023-10-24 LAB — TESTOSTERONE, FREE, TOTAL, SHBG
Sex Hormone Binding: 57 nmol/L (ref 24.6–122.0)
Testosterone, Free: 1.2 pg/mL (ref 0.0–4.2)
Testosterone: 23 ng/dL (ref 4–50)

## 2023-10-27 ENCOUNTER — Telehealth: Payer: Self-pay

## 2023-10-27 NOTE — Telephone Encounter (Signed)
 Called pt to schedule orthotic pick up.. pt will call back to schedule

## 2023-10-28 ENCOUNTER — Encounter: Payer: Self-pay | Admitting: Obstetrics & Gynecology

## 2023-10-29 ENCOUNTER — Ambulatory Visit (HOSPITAL_COMMUNITY)
Admission: RE | Admit: 2023-10-29 | Discharge: 2023-10-29 | Disposition: A | Discharge status: Home / Self Care | Source: Ambulatory Visit | Attending: Gynecologic Oncology | Admitting: Gynecologic Oncology

## 2023-10-29 DIAGNOSIS — N838 Other noninflammatory disorders of ovary, fallopian tube and broad ligament: Secondary | ICD-10-CM | POA: Diagnosis present

## 2023-10-29 MED ORDER — GADOBUTROL 1 MMOL/ML IV SOLN
10.0000 mL | Freq: Once | INTRAVENOUS | Status: AC | PRN
  Administered 2023-10-29: 10 mL via INTRAVENOUS

## 2023-10-30 ENCOUNTER — Telehealth: Payer: Self-pay | Admitting: Gynecologic Oncology

## 2023-10-30 DIAGNOSIS — D3912 Neoplasm of uncertain behavior of left ovary: Secondary | ICD-10-CM

## 2023-10-30 NOTE — Telephone Encounter (Signed)
 Called patient - discussed recent MRI results - no mass appreciated. Discussed labs (estradiol , DHEA-S, and testosterone ), which are all normal. The patient would like to avoid surgery, which is very reasonable given test results. I recommended that we plan on f/u ultrasound in 4-5 months with a visit. Patient amenable. Message sent to my clinic to schedule.  Wiley Hanger MD Gynecologic Oncology

## 2023-10-31 ENCOUNTER — Telehealth: Payer: Self-pay | Admitting: *Deleted

## 2023-10-31 NOTE — Telephone Encounter (Signed)
 Per provider scheduled US  and MD visit for September. Attempted to reach the patient, but VM full. Sent the patient a my chart message with the appt dates/times

## 2023-11-12 ENCOUNTER — Telehealth: Payer: Self-pay

## 2023-11-12 ENCOUNTER — Inpatient Hospital Stay: Admitting: Gynecologic Oncology

## 2023-11-12 NOTE — Telephone Encounter (Signed)
 Per Dr Orvil Bland .Aaron Aas Called patient to just check in and see if she had any questions and how things were going.  Also informed patient of her ultrasound appointment on 9/22 and her follow up with Dr Orvil Bland on 10/3.  Patient confirmed and understood.. this led to cancelling the phone visit for today 5/21.Aaron AasAaron Aas

## 2024-03-15 ENCOUNTER — Ambulatory Visit (HOSPITAL_COMMUNITY)

## 2024-03-17 ENCOUNTER — Encounter (HOSPITAL_COMMUNITY): Payer: Self-pay

## 2024-03-17 ENCOUNTER — Ambulatory Visit (HOSPITAL_COMMUNITY)
Admission: RE | Admit: 2024-03-17 | Discharge: 2024-03-17 | Disposition: A | Discharge status: Home / Self Care | Source: Ambulatory Visit | Attending: Gynecologic Oncology | Admitting: Gynecologic Oncology

## 2024-03-17 DIAGNOSIS — D3912 Neoplasm of uncertain behavior of left ovary: Secondary | ICD-10-CM | POA: Diagnosis present

## 2024-03-26 ENCOUNTER — Inpatient Hospital Stay: Admitting: Gynecologic Oncology

## 2024-03-26 NOTE — Progress Notes (Deleted)
 Gynecologic Oncology Return Clinic Visit  03/26/24  Reason for Visit: ***  Treatment History: I saw the patient initially in January 2021 after she presented with a complex leg mass in the setting of a long history of abnormal uterine bleeding and PCOS.  She ultimately underwent surgery with me as noted below:   08/05/19: Robotic assisted bilateral salpingectomy and left oophorectomy, right uterosacral ligament implant biopsy. 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  Biopsy with stromal calcifications, negative for malignancy. Cytology negative. Hormone testing on 08/11/19:  FSH 11.1 LH 6.1 Testosterone  13 Cortisol 7.6   CT surveillance imaging in 2/022 showed similar appearing left para-aortic lymph node measuring 1.1 cm. NO new or progressive lymph node enlargement in the A/P. New 6.7 x 5.4 right adnexal cyst, nonspecific. Mild diffuse hepatic steatosis.  Pelvic ultrasound in 08/2020 to follow-up new pelvic cyst on CT showed right ovary measured 4.6 cm with a simple 5.4 x 3.1 x 4.4 cm with no mural nodularity or internal septations, no ascites.    The patient was then lost to follow-up until this year. She saw Dr. Barbette in April. Pelvic ultrasound at [facility] on 4/23 showed right ovary measured up to 6.3 cm with a mixed echogenicity solid component along with several complex appearing with increased blood flow.   10/29/23: MRI pelvis 1. Right ovary is difficult to clearly visualize, most likely atrophic. A previously seen sizable cyst is resolved. No pelvic mass or suspicious contrast enhancement. 2. Status post left oophorectomy. 3. Uterine adenomyosis without discrete fibroids.  Pelvic ultrasound 03/17/24:  1. Nonspecific 1.3 cm nonvascular area of mixed echogenicity within the right ovary. This could represent a small hemorrhagic cyst or corpus luteum. Follow-up pelvic ultrasound in 6-12 weeks is recommended to ensure  resolution. 2. Status post left oophorectomy. No left adnexal mass. 3. Unremarkable sonographic appearance of the uterus and endometrium.  Interval History: ***  Past Medical/Surgical History: Past Medical History:  Diagnosis Date   Adnexal mass    Allergy    Diabetes mellitus    type 2   Diabetes mellitus without complication (HCC)    Family history of breast cancer    Family history of colon cancer    Family history of lung cancer    Family history of pancreatic cancer    Gall bladder stones    GERD (gastroesophageal reflux disease)    History of infection due to human papilloma virus (HPV) 2019   per patient , now resolved    Hypertension    Obesity    PCOS (polycystic ovarian syndrome)    Personal history of cervical dysplasia    Sex cord tumor of ovary, 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    Past Surgical History:  Procedure Laterality Date   CHOLECYSTECTOMY N/A 12/05/2018   Procedure: LAPAROSCOPIC CHOLECYSTECTOMY WITH INTRAOPERATIVE CHOLANGIOGRAM;  Surgeon: Ethyl Lenis, MD;  Location: [facility];  Service: General;  Laterality: N/A;   KNEE ARTHROSCOPY Right 1993   ROBOTIC ASSISTED SALPINGO OOPHERECTOMY N/A 08/05/2019   Procedure: XI ROBOTIC ASSISTED UNILATERAL   OOPHORECTOMY,BILATERAL SALPINGECTOMY;  Surgeon: Viktoria Comer SAUNDERS, MD;  Location: [facility];  Service: Gynecology;  Laterality: N/A;    Family History  Problem Relation Age of Onset   Hypertension Father    Breast cancer Paternal Grandmother 82   Lung cancer Paternal Grandfather 10   Esophageal cancer Paternal Grandfather    Pancreatic cancer Maternal Aunt 53   Colon cancer Paternal Aunt 38   Diabetes  Maternal Grandmother    Breast cancer Maternal Aunt 77   Cancer Paternal Aunt        unk type   Diabetes Maternal Aunt    Ovarian cancer Neg Hx    Endometrial cancer Neg Hx    Colon polyps Neg Hx    Rectal cancer Neg Hx    Stomach cancer Neg Hx     Social History   Socioeconomic History   Marital status: Single     Spouse name: Not on file   Number of children: 0   Years of education: Not on file   Highest education level: Not on file  Occupational History   Occupation: [employer] office  Tobacco Use   Smoking status: Former    Current packs/day: 0.00    Types: Cigarettes    Quit date: 07/29/2017    Years since quitting: 6.6   Smokeless tobacco: Never  Vaping Use   Vaping status: Never Used  Substance and Sexual Activity   Alcohol use: Yes    Comment: 1 bottle of wine each night    Drug use: No   Sexual activity: Yes    Birth control/protection: None  Other Topics Concern   Not on file  Social History Narrative   She is single, she has 2 adopted sons born 2003 in 2014.  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       Social Drivers of Corporate investment banker Strain: Low Risk  (11/12/2023)   Received from [facility]   Overall Financial Resource Strain (CARDIA)    Difficulty of Paying Living Expenses: Not hard at all  Food Insecurity: No Food Insecurity (11/12/2023)   Received [facility]   Hunger Vital Sign    Within the past 12 months, you worried that your food would run out before you got the money to buy more.: Never true    Within the past 12 months, the food you bought just didn't last and you didn't have money to get more.: Never true  Transportation Needs: No Transportation Needs (11/12/2023)   Received from [facility] - Transportation    Lack of Transportation (Medical): No    Lack of Transportation (Non-Medical): No  Physical Activity: Sufficiently Active (11/12/2023)   Received from [facility]   Exercise Vital Sign    On average, how many days per week do you engage in moderate to strenuous exercise (like a brisk walk)?: 2 days    On average, how many minutes do you engage in exercise at this level?: 150+ min  Stress: No Stress Concern Present (11/12/2023)   Received from [facility] of Occupational Health - Occupational Stress Questionnaire    Feeling of Stress : Only a little  Social Connections: Somewhat Isolated (11/12/2023)   Received from [facility]   Social Network    How would you rate your social network (family, work, friends)?: Restricted participation with some degree of social isolation    Current Medications:  Current Outpatient Medications:    amLODipine (NORVASC) 5 MG tablet, Take 5 mg by mouth daily., Disp: , Rfl:    cetirizine  (ZYRTEC ) 10 MG tablet, Take 1 tablet (10 mg total) by mouth daily., Disp: 30 tablet, Rfl: 0   ciclopirox  (PENLAC ) 8 % solution, Apply topically at bedtime. Apply over nail and surrounding skin. Apply daily over previous coat. After  seven (7) days, may remove with alcohol and continue cycle., Disp: 6.6 mL, Rfl: 0   ciprofloxacin  (CILOXAN ) 0.3 % ophthalmic solution, Administer 1 drop, every 2 hours, while awake, for 2 days. Then 1 drop, every 4 hours, while awake, for the next 5 days., Disp: 5 mL, Rfl: 0   latanoprost (XALATAN) 0.005 % ophthalmic solution, SMARTSIG:1 Drop(s) In Eye(s) Every Evening, Disp: , Rfl:    losartan -hydrochlorothiazide  (HYZAAR) 100-12.5 MG tablet, Take 1 tablet by mouth daily., Disp: 90 tablet, Rfl: 3   ondansetron  (ZOFRAN ) 4 MG tablet, TAKE 1 TABLET BY MOUTH EVERY 8 HOURS AS NEEDED FOR NAUSEA FOR UP TO 7 DAYS, Disp: , Rfl:    OZEMPIC, 0.25 OR 0.5 MG/DOSE, 2 MG/3ML SOPN, SMARTSIG:0.25 Milligram(s) SUB-Q Once a Week, Disp: , Rfl:   Review of Systems: Denies appetite changes, fevers, chills, fatigue, unexplained weight changes. Denies hearing loss, neck lumps or masses, mouth sores, ringing in ears or voice changes. Denies cough or wheezing.  Denies shortness of breath. Denies chest pain or palpitations. Denies leg swelling. Denies abdominal distention, pain, blood in stools, constipation, diarrhea, nausea, vomiting, or early satiety. Denies pain with intercourse, dysuria,  frequency, hematuria or incontinence. Denies hot flashes, pelvic pain, vaginal bleeding or vaginal discharge.   Denies joint pain, back pain or muscle pain/cramps. Denies itching, rash, or wounds. Denies dizziness, headaches, numbness or seizures. Denies swollen lymph nodes or glands, denies easy bruising or bleeding. Denies anxiety, depression, confusion, or decreased concentration.  Physical Exam: There were no vitals taken for this visit. General: ***Alert, oriented, no acute distress. HEENT: ***Posterior oropharynx clear, sclera anicteric. Chest: ***Clear to auscultation bilaterally.  ***Port site clean. Cardiovascular: ***Regular rate and rhythm, no murmurs. Abdomen: ***Obese, soft, nontender.  Normoactive bowel sounds.  No masses or hepatosplenomegaly appreciated.  ***Well-healed scar. Extremities: ***Grossly normal range of motion.  Warm, well perfused.  No edema bilaterally. Skin: ***No rashes or lesions noted. Lymphatics: ***No cervical, supraclavicular, or inguinal adenopathy. GU: Normal appearing external genitalia without erythema, excoriation, or lesions.  Speculum exam reveals ***.  Bimanual exam reveals ***.  ***Rectovaginal exam  confirms ___.  Laboratory & Radiologic Studies:    Component Ref Range & Units (hover) 5 mo ago  DHEA-SO4 59.9     Component Ref Range & Units (hover) 5 mo ago 4 yr ago  Testosterone  23 13 R, CM  Testosterone , Free 1.2   Sex Hormone Binding 57.0   Comment: (NOTE)   Estradiol  41.8 32.9 CM  Comment: (NOTE)                     Adult Female             Range                      Follicular phase     12.5 - 166.0                      Ovulation phase      85.8 - 498.0                      Luteal phase         43.8 - 211.0                      Postmenopausal       <6.0 -  54.7  Pregnancy                      1st trimester     215.0 - >4300.0   Assessment & Plan: Doris Armstrong is a 48 y.o. 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   ***Given borderline enlarged para-aortic lymph nodes around the time of her surgery on imaging, I recommend again that we repeat imaging.  *** Patient was amenable to having a schedule a repeat CT scan.    *** minutes of total time was spent for this patient encounter, including preparation, face-to-face counseling with the patient and coordination of care, and documentation of the encounter.  Comer Dollar, MD  Division of Gynecologic Oncology  Department of Obstetrics and Gynecology  [facility] [place]  [facility]

## 2024-03-29 ENCOUNTER — Telehealth: Payer: Self-pay | Admitting: *Deleted

## 2024-03-29 NOTE — Telephone Encounter (Signed)
 Spoke with patient who rescheduled her appt. On 10/3 with Dr. Viktoria to 10/24 at 1pm. Pt had no further concerns at this time.

## 2024-04-16 ENCOUNTER — Inpatient Hospital Stay: Attending: Gynecologic Oncology | Admitting: Gynecologic Oncology

## 2024-04-16 ENCOUNTER — Inpatient Hospital Stay

## 2024-04-16 ENCOUNTER — Encounter: Payer: Self-pay | Admitting: Gynecologic Oncology

## 2024-04-16 DIAGNOSIS — Z90721 Acquired absence of ovaries, unilateral: Secondary | ICD-10-CM | POA: Diagnosis not present

## 2024-04-16 DIAGNOSIS — Z86018 Personal history of other benign neoplasm: Secondary | ICD-10-CM | POA: Insufficient documentation

## 2024-04-16 DIAGNOSIS — Z801 Family history of malignant neoplasm of trachea, bronchus and lung: Secondary | ICD-10-CM | POA: Insufficient documentation

## 2024-04-16 DIAGNOSIS — Z803 Family history of malignant neoplasm of breast: Secondary | ICD-10-CM | POA: Insufficient documentation

## 2024-04-16 DIAGNOSIS — D3912 Neoplasm of uncertain behavior of left ovary: Secondary | ICD-10-CM

## 2024-04-16 DIAGNOSIS — Z9079 Acquired absence of other genital organ(s): Secondary | ICD-10-CM | POA: Insufficient documentation

## 2024-04-16 DIAGNOSIS — Z800 Family history of malignant neoplasm of digestive organs: Secondary | ICD-10-CM | POA: Insufficient documentation

## 2024-04-16 NOTE — Patient Instructions (Signed)
 It was good to see you today.  I do not see or feel any evidence of cancer recurrence on your exam.  I will see you for follow-up in 6 months.  I will let you know when I get your ultrasound results back.  As always, if you develop any new and concerning symptoms before your next visit, please call to see me sooner.

## 2024-04-16 NOTE — Progress Notes (Signed)
 Gynecologic Oncology Return Clinic Visit  04/16/24  Reason for Visit: follow-up  Treatment History: I saw the patient initially in January 2021 after she presented with a complex leg mass in the setting of a long history of abnormal uterine bleeding and PCOS.  She ultimately underwent surgery with me as noted below:   08/05/19: Robotic assisted bilateral salpingectomy and left oophorectomy, right uterosacral ligament implant biopsy. 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  Biopsy with stromal calcifications, negative for malignancy. Cytology negative. Hormone testing on 08/11/19:  FSH 11.1 LH 6.1 Testosterone  13 Cortisol 7.6   CT surveillance imaging in 2/022 showed similar appearing left para-aortic lymph node measuring 1.1 cm. NO new or progressive lymph node enlargement in the A/P. New 6.7 x 5.4 right adnexal cyst, nonspecific. Mild diffuse hepatic steatosis.  Pelvic ultrasound in 08/2020 to follow-up new pelvic cyst on CT showed right ovary measured 4.6 cm with a simple 5.4 x 3.1 x 4.4 cm with no mural nodularity or internal septations, no ascites.    The patient was then lost to follow-up until this year. She saw Dr. Barbette in April. Pelvic ultrasound at [facility] on 4/23 showed right ovary measured up to 6.3 cm with a mixed echogenicity solid component along with several complex appearing with increased blood flow.   10/29/23: MRI pelvis 1. Right ovary is difficult to clearly visualize, most likely atrophic. A previously seen sizable cyst is resolved. No pelvic mass or suspicious contrast enhancement. 2. Status post left oophorectomy. 3. Uterine adenomyosis without discrete fibroids.  03/17/24: Pelvic ultrasound 1. Nonspecific 1.3 cm nonvascular area of mixed echogenicity within the right ovary. This could represent a small hemorrhagic cyst or corpus luteum. Follow-up pelvic ultrasound in 6-12 weeks is recommended to  ensure resolution. 2. Status post left oophorectomy. No left adnexal mass. 3. Unremarkable sonographic appearance of the uterus and endometrium.  Interval History: Doing well.  Denies any pelvic pain.  Has not had a menstrual cycle in 2 months.  Reports normal bowel and bladder function.  Past Medical/Surgical History: Past Medical History:  Diagnosis Date   Adnexal mass    Allergy    Diabetes mellitus    type 2   Diabetes mellitus without complication (HCC)    Family history of breast cancer    Family history of colon cancer    Family history of lung cancer    Family history of pancreatic cancer    Gall bladder stones    GERD (gastroesophageal reflux disease)    History of infection due to human papilloma virus (HPV) 2019   per patient , now resolved    Hypertension    Obesity    PCOS (polycystic ovarian syndrome)    Personal history of cervical dysplasia    Sex cord tumor of ovary, 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    Past Surgical History:  Procedure Laterality Date   CHOLECYSTECTOMY N/A 12/05/2018   Procedure: LAPAROSCOPIC CHOLECYSTECTOMY WITH INTRAOPERATIVE CHOLANGIOGRAM;  Surgeon: Ethyl Lenis, MD;  Location: [facility];  Service: General;  Laterality: N/A;   KNEE ARTHROSCOPY Right 1993   ROBOTIC ASSISTED SALPINGO OOPHERECTOMY N/A 08/05/2019   Procedure: XI ROBOTIC ASSISTED UNILATERAL   OOPHORECTOMY,BILATERAL SALPINGECTOMY;  Surgeon: Viktoria Comer SAUNDERS, MD;  Location: [facility];  Service: Gynecology;  Laterality: N/A;    Family History  Problem Relation Age of Onset   Hypertension Father    Breast cancer Paternal Grandmother 62   Lung cancer Paternal Grandfather 88  Esophageal cancer Paternal Grandfather    Pancreatic cancer Maternal Aunt 67   Colon cancer Paternal Aunt 64   Diabetes Maternal Grandmother    Breast cancer Maternal Aunt 70   Cancer Paternal Aunt        unk type   Diabetes Maternal Aunt    Ovarian cancer Neg Hx    Endometrial cancer Neg Hx    Colon polyps Neg Hx     Rectal cancer Neg Hx    Stomach cancer Neg Hx     Social History   Socioeconomic History   Marital status: Single    Spouse name: Not on file   Number of children: 0   Years of education: Not on file   Highest education level: Not on file  Occupational History   Occupation: [employer] office  Tobacco Use   Smoking status: Former    Current packs/day: 0.00    Types: Cigarettes    Quit date: 07/29/2017    Years since quitting: 6.7   Smokeless tobacco: Never  Vaping Use   Vaping status: Never Used  Substance and Sexual Activity   Alcohol use: Yes    Comment: 1 bottle of wine each night    Drug use: No   Sexual activity: Yes    Birth control/protection: None  Other Topics Concern   Not on file  Social History Narrative   She is single, she has 2 adopted sons born 2003 in 2014.  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       Social Drivers of Corporate investment banker Strain: Low Risk  (11/12/2023)   Received from [facility]   Overall Financial Resource Strain (CARDIA)    Difficulty of Paying Living Expenses: Not hard at all  Food Insecurity: No Food Insecurity (11/12/2023)   Received [facility]   Hunger Vital Sign    Within the past 12 months, you worried that your food would run out before you got the money to buy more.: Never true    Within the past 12 months, the food you bought just didn't last and you didn't have money to get more.: Never true  Transportation Needs: No Transportation Needs (11/12/2023)   Received from [facility] - Transportation    Lack of Transportation (Medical): No    Lack of Transportation (Non-Medical): No  Physical Activity: Sufficiently Active (11/12/2023)   Received from [facility]   Exercise Vital Sign    On average, how many days per week do you engage in moderate to strenuous exercise (like a brisk walk)?: 2 days    On average, how many  minutes do you engage in exercise at this level?: 150+ min  Stress: No Stress Concern Present (11/12/2023)   Received from [facility] of Occupational Health - Occupational Stress Questionnaire    Feeling of Stress : Only a little  Social Connections: Somewhat Isolated (11/12/2023)   Received from [facility]   Social Network    How would you rate your social network (family, work, friends)?: Restricted participation with some degree of social isolation    Current Medications:  Current Outpatient Medications:    amLODipine (NORVASC) 5 MG tablet, Take 5 mg by mouth daily., Disp: , Rfl:    cetirizine  (ZYRTEC ) 10 MG tablet, Take 1 tablet (10 mg total) by mouth daily., Disp: 30 tablet, Rfl: 0  ciclopirox  (PENLAC ) 8 % solution, Apply topically at bedtime. Apply over nail and surrounding skin. Apply daily over previous coat. After seven (7) days, may remove with alcohol and continue cycle., Disp: 6.6 mL, Rfl: 0   ciprofloxacin  (CILOXAN ) 0.3 % ophthalmic solution, Administer 1 drop, every 2 hours, while awake, for 2 days. Then 1 drop, every 4 hours, while awake, for the next 5 days., Disp: 5 mL, Rfl: 0   latanoprost (XALATAN) 0.005 % ophthalmic solution, SMARTSIG:1 Drop(s) In Eye(s) Every Evening, Disp: , Rfl:    losartan -hydrochlorothiazide  (HYZAAR) 100-12.5 MG tablet, Take 1 tablet by mouth daily., Disp: 90 tablet, Rfl: 3   ondansetron  (ZOFRAN ) 4 MG tablet, TAKE 1 TABLET BY MOUTH EVERY 8 HOURS AS NEEDED FOR NAUSEA FOR UP TO 7 DAYS, Disp: , Rfl:    OZEMPIC, 0.25 OR 0.5 MG/DOSE, 2 MG/3ML SOPN, SMARTSIG:0.25 Milligram(s) SUB-Q Once a Week, Disp: , Rfl:   Review of Systems: Denies appetite changes, fevers, chills, fatigue, unexplained weight changes. Denies hearing loss, neck lumps or masses, mouth sores, ringing in ears or voice changes. Denies cough or wheezing.  Denies shortness of breath. Denies chest pain or palpitations. Denies leg swelling. Denies abdominal  distention, pain, blood in stools, constipation, diarrhea, nausea, vomiting, or early satiety. Denies pain with intercourse, dysuria, frequency, hematuria or incontinence. Denies hot flashes, pelvic pain or vaginal discharge.   Denies joint pain, back pain or muscle pain/cramps. Denies itching, rash, or wounds. Denies dizziness, headaches, numbness or seizures. Denies swollen lymph nodes or glands, denies easy bruising or bleeding. Denies anxiety, depression, confusion, or decreased concentration.  Physical Exam: BP 118/79 (BP Location: Left Arm, Patient Position: Sitting)   Pulse 71   Temp 98.1 F (36.7 C) (Oral)   Resp 20   Wt 299 lb 12.8 oz (136 kg)   SpO2 100%   BMI 40.66 kg/m  General: Alert, oriented, no acute distress.  HEENT: Normocephalic, atraumatic. Sclera anicteric.  Chest: Clear to auscultation bilaterally. No wheezes, rhonchi, or rales. Cardiovascular: Regular rate and rhythm, no murmurs, rubs, or gallops.  Abdomen: Obese. Normoactive bowel sounds. Soft, nondistended, nontender to palpation. No masses or hepatosplenomegaly appreciated. No palpable fluid wave. Well healed laparoscopic incisions. Extremities: Grossly normal range of motion. Warm, well perfused. No edema bilaterally.  Skin: No rashes or lesions.  Lymphatics: No cervical, supraclavicular, or inguinal adenopathy.  GU: Normal appearing external genitalia without erythema, excoriation, or lesions.  Speculum exam reveals normal-appearing cervix, no lesions.  Bimanual exam reveals mobile uterus, no palpable adnexal masses.    Laboratory & Radiologic Studies:     Component Ref Range & Units (hover) 5 mo ago 4 yr ago  Estradiol  41.8 32.9 CM         Component Ref Range & Units (hover) 5 mo ago 4 yr ago  Testosterone  23 13 R, CM  Testosterone , Free 1.2   Sex Hormone Binding 57.0         Component Ref Range & Units (hover) 5 mo ago  DHEA-SO4 59.9     Assessment & Plan: Doris Armstrong is a 48 y.o.  woman with complex adnexal mass in the setting of a history of steroid cell tumor of the ovary.   Overall doing well, NED on exam today.  Discussed causes for no menses over the last 2 months including hormonal changes in the setting of her age and possible perimenopause.  Will add estradiol  level to her other hormones.  Plan for repeat ultrasound 3 months after her last  follow-up likely small hemorrhagic cyst or corpus luteum in the right ovary.  Will see her for follow-up in 6 months.  Plan to get testosterone  and DHEA-S today.  20 minutes of total time was spent for this patient encounter, including preparation, face-to-face counseling with the patient and coordination of care, and documentation of the encounter.  Comer Dollar, MD  Division of Gynecologic Oncology  Department of Obstetrics and Gynecology  [facility] [place]  [facility]

## 2024-04-17 LAB — DHEA-SULFATE: DHEA-SO4: 61.1 ug/dL (ref 41.2–243.7)

## 2024-04-17 LAB — ESTRADIOL: Estradiol: 27.6 pg/mL

## 2024-04-18 LAB — TESTOSTERONE, FREE, TOTAL, SHBG
Sex Hormone Binding: 53.9 nmol/L (ref 24.6–122.0)
Testosterone, Free: 1 pg/mL (ref 0.0–4.2)
Testosterone: 45 ng/dL (ref 4–50)

## 2024-04-23 ENCOUNTER — Ambulatory Visit: Payer: Self-pay | Admitting: Gynecologic Oncology

## 2024-04-26 ENCOUNTER — Encounter: Payer: Self-pay | Admitting: Radiology

## 2024-06-22 ENCOUNTER — Ambulatory Visit (HOSPITAL_COMMUNITY)

## 2024-06-22 ENCOUNTER — Encounter (HOSPITAL_COMMUNITY): Payer: Self-pay

## 2024-06-23 ENCOUNTER — Ambulatory Visit (HOSPITAL_COMMUNITY): Admission: RE | Admit: 2024-06-23 | Discharge: 2024-06-23 | Attending: Gynecologic Oncology

## 2024-06-23 DIAGNOSIS — D3912 Neoplasm of uncertain behavior of left ovary: Secondary | ICD-10-CM | POA: Diagnosis present

## 2024-07-13 NOTE — Telephone Encounter (Signed)
 Attempted to reach patient to relay results from provider. Unable to leave voicemail due to mailbox is full.

## 2024-07-13 NOTE — Telephone Encounter (Signed)
-----   Message from Comer Dollar, MD sent at 07/13/2024 12:51 PM EST ----- It does not look like she saw my message about her ultrasound - could you please call and review with her? Thank you!

## 2024-07-14 NOTE — Telephone Encounter (Signed)
 2nd attempt to reach patient to relay result message from provider. Left message requesting call back.

## 2024-07-14 NOTE — Telephone Encounter (Signed)
 Spoke with patient who returned call from office. Relayed result message from Dr. Viktoria.   Good news - things look stable in terms of your right ovary. There is still a very small area that they are seen in the ovary (that we saw on your MRI). No significant change in size or appearance.   Pt verbalized understanding and thanked the office for calling.

## 2024-07-14 NOTE — Telephone Encounter (Signed)
-----   Message from Comer Dollar, MD sent at 07/13/2024 12:51 PM EST ----- It does not look like she saw my message about her ultrasound - could you please call and review with her? Thank you!

## 2024-10-15 ENCOUNTER — Inpatient Hospital Stay: Admitting: Gynecologic Oncology
